# Patient Record
Sex: Female | Born: 1990 | Race: White | Hispanic: No | Smoking: Former smoker
Health system: Southern US, Community
[De-identification: ages and names within clinical notes are randomized; demographics above are authoritative.]

## PROBLEM LIST (undated history)

## (undated) ENCOUNTER — Inpatient Hospital Stay: Payer: Self-pay

## (undated) DIAGNOSIS — Z720 Tobacco use: Secondary | ICD-10-CM

## (undated) DIAGNOSIS — F411 Generalized anxiety disorder: Secondary | ICD-10-CM

## (undated) DIAGNOSIS — G43909 Migraine, unspecified, not intractable, without status migrainosus: Secondary | ICD-10-CM

## (undated) DIAGNOSIS — Z8759 Personal history of other complications of pregnancy, childbirth and the puerperium: Secondary | ICD-10-CM

## (undated) DIAGNOSIS — F329 Major depressive disorder, single episode, unspecified: Secondary | ICD-10-CM

## (undated) HISTORY — DX: Migraine, unspecified, not intractable, without status migrainosus: G43.909

## (undated) HISTORY — DX: Personal history of other complications of pregnancy, childbirth and the puerperium: Z87.59

## (undated) HISTORY — DX: Major depressive disorder, single episode, unspecified: F32.9

## (undated) HISTORY — DX: Tobacco use: Z72.0

## (undated) HISTORY — PX: IUD REMOVAL: SHX5392

---
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
## 2008-01-20 ENCOUNTER — Observation Stay: Payer: Self-pay

## 2008-01-22 ENCOUNTER — Observation Stay: Payer: Self-pay | Admitting: Obstetrics and Gynecology

## 2008-04-24 ENCOUNTER — Emergency Department: Payer: Self-pay | Admitting: Emergency Medicine

## 2008-12-04 ENCOUNTER — Emergency Department: Payer: Self-pay | Admitting: Emergency Medicine

## 2008-12-29 ENCOUNTER — Inpatient Hospital Stay: Payer: Self-pay | Admitting: Internal Medicine

## 2009-04-17 ENCOUNTER — Emergency Department: Payer: Self-pay | Admitting: Emergency Medicine

## 2009-08-07 ENCOUNTER — Emergency Department: Payer: Self-pay | Admitting: Unknown Physician Specialty

## 2009-12-16 ENCOUNTER — Emergency Department: Payer: Self-pay | Admitting: Emergency Medicine

## 2010-03-11 ENCOUNTER — Emergency Department: Payer: Self-pay | Admitting: Unknown Physician Specialty

## 2010-08-01 ENCOUNTER — Emergency Department: Payer: Self-pay | Admitting: Emergency Medicine

## 2010-11-20 ENCOUNTER — Inpatient Hospital Stay: Payer: Self-pay | Admitting: Internal Medicine

## 2011-02-12 ENCOUNTER — Emergency Department: Payer: Self-pay | Admitting: Emergency Medicine

## 2011-04-24 ENCOUNTER — Observation Stay: Payer: Self-pay | Admitting: Obstetrics and Gynecology

## 2011-06-19 ENCOUNTER — Emergency Department: Payer: Self-pay | Admitting: Emergency Medicine

## 2011-09-12 ENCOUNTER — Emergency Department: Payer: Self-pay | Admitting: Emergency Medicine

## 2012-03-11 ENCOUNTER — Emergency Department: Payer: Self-pay | Admitting: Emergency Medicine

## 2012-03-11 LAB — URINALYSIS, COMPLETE
Bacteria: NONE SEEN
Bilirubin,UR: NEGATIVE
Blood: NEGATIVE
Glucose,UR: NEGATIVE mg/dL (ref 0–75)
Ketone: NEGATIVE
Leukocyte Esterase: NEGATIVE
Nitrite: NEGATIVE
Ph: 6 (ref 4.5–8.0)
Protein: NEGATIVE
RBC,UR: 2 /HPF (ref 0–5)
Specific Gravity: 1.017 (ref 1.003–1.030)
Squamous Epithelial: 5
WBC UR: 2 /HPF (ref 0–5)

## 2012-03-11 LAB — COMPREHENSIVE METABOLIC PANEL
Albumin: 4.7 g/dL (ref 3.4–5.0)
Alkaline Phosphatase: 63 U/L (ref 50–136)
Anion Gap: 8 (ref 7–16)
BUN: 16 mg/dL (ref 7–18)
Bilirubin,Total: 0.5 mg/dL (ref 0.2–1.0)
Calcium, Total: 8.9 mg/dL (ref 8.5–10.1)
Chloride: 104 mmol/L (ref 98–107)
Co2: 26 mmol/L (ref 21–32)
Creatinine: 0.54 mg/dL — ABNORMAL LOW (ref 0.60–1.30)
EGFR (African American): >60
EGFR (Non-African Amer.): >60
Glucose: 89 mg/dL (ref 65–99)
Osmolality: 276 (ref 275–301)
Potassium: 4.2 mmol/L (ref 3.5–5.1)
SGOT(AST): 21 U/L (ref 15–37)
SGPT (ALT): 25 U/L
Sodium: 138 mmol/L (ref 136–145)
Total Protein: 8.2 g/dL (ref 6.4–8.2)

## 2012-03-11 LAB — CBC
HCT: 42.4 % (ref 35.0–47.0)
HGB: 14 g/dL (ref 12.0–16.0)
MCH: 28.3 pg (ref 26.0–34.0)
MCHC: 33 g/dL (ref 32.0–36.0)
MCV: 86 fL (ref 80–100)
Platelet: 197 10*3/uL (ref 150–440)
RBC: 4.95 10*6/uL (ref 3.80–5.20)
RDW: 15.3 % — ABNORMAL HIGH (ref 11.5–14.5)
WBC: 7.3 10*3/uL (ref 3.6–11.0)

## 2012-03-11 LAB — WET PREP, GENITAL

## 2012-03-11 LAB — HCG, QUANTITATIVE, PREGNANCY: Beta Hcg, Quant.: <1 m[IU]/mL — ABNORMAL LOW

## 2012-05-29 ENCOUNTER — Emergency Department: Payer: Self-pay | Admitting: Unknown Physician Specialty

## 2012-05-30 LAB — CBC WITH DIFFERENTIAL/PLATELET
Basophil #: 0 10*3/uL (ref 0.0–0.1)
Basophil %: 0.1 %
Eosinophil #: 0 10*3/uL (ref 0.0–0.7)
Eosinophil %: 0.1 %
HCT: 38.4 % (ref 35.0–47.0)
HGB: 12.6 g/dL (ref 12.0–16.0)
Lymphocyte #: 1 10*3/uL (ref 1.0–3.6)
Lymphocyte %: 6.1 %
MCH: 28.3 pg (ref 26.0–34.0)
MCHC: 32.8 g/dL (ref 32.0–36.0)
MCV: 86 fL (ref 80–100)
Monocyte #: 1.1 x10 3/mm — ABNORMAL HIGH (ref 0.2–0.9)
Monocyte %: 6.6 %
Neutrophil #: 14.6 10*3/uL — ABNORMAL HIGH (ref 1.4–6.5)
Neutrophil %: 87.1 %
Platelet: 126 10*3/uL — ABNORMAL LOW (ref 150–440)
RBC: 4.46 10*6/uL (ref 3.80–5.20)
RDW: 14.9 % — ABNORMAL HIGH (ref 11.5–14.5)
WBC: 16.7 10*3/uL — ABNORMAL HIGH (ref 3.6–11.0)

## 2012-05-30 LAB — URINALYSIS, COMPLETE
Bacteria: NONE SEEN
Bilirubin,UR: NEGATIVE
Blood: NEGATIVE
Glucose,UR: NEGATIVE mg/dL (ref 0–75)
Ketone: NEGATIVE
Leukocyte Esterase: NEGATIVE
Nitrite: NEGATIVE
Ph: 7 (ref 4.5–8.0)
Protein: NEGATIVE
RBC,UR: NONE SEEN /HPF (ref 0–5)
Specific Gravity: 1.024 (ref 1.003–1.030)
Squamous Epithelial: 5
WBC UR: NONE SEEN /HPF (ref 0–5)

## 2012-05-30 LAB — COMPREHENSIVE METABOLIC PANEL
Albumin: 4.1 g/dL (ref 3.4–5.0)
Alkaline Phosphatase: 91 U/L (ref 50–136)
Anion Gap: 11 (ref 7–16)
BUN: 17 mg/dL (ref 7–18)
Bilirubin,Total: 0.3 mg/dL (ref 0.2–1.0)
Calcium, Total: 8.6 mg/dL (ref 8.5–10.1)
Chloride: 109 mmol/L — ABNORMAL HIGH (ref 98–107)
Co2: 22 mmol/L (ref 21–32)
Creatinine: 0.56 mg/dL — ABNORMAL LOW (ref 0.60–1.30)
EGFR (African American): >60
EGFR (Non-African Amer.): >60
Glucose: 106 mg/dL — ABNORMAL HIGH (ref 65–99)
Osmolality: 285 (ref 275–301)
Potassium: 3.7 mmol/L (ref 3.5–5.1)
SGOT(AST): 24 U/L (ref 15–37)
SGPT (ALT): 23 U/L
Sodium: 142 mmol/L (ref 136–145)
Total Protein: 7 g/dL (ref 6.4–8.2)

## 2012-05-30 LAB — CK TOTAL AND CKMB (NOT AT ARMC)
CK, Total: 84 U/L (ref 21–215)
CK-MB: <0.5 ng/mL — ABNORMAL LOW (ref 0.5–3.6)

## 2012-05-30 LAB — WET PREP, GENITAL

## 2012-05-30 LAB — PREGNANCY, URINE: Pregnancy Test, Urine: NEGATIVE m[IU]/mL

## 2012-05-31 LAB — URINE CULTURE

## 2012-06-02 ENCOUNTER — Emergency Department: Payer: Self-pay | Admitting: Emergency Medicine

## 2012-06-02 LAB — URINALYSIS, COMPLETE
Blood: NEGATIVE
Glucose,UR: NEGATIVE mg/dL (ref 0–75)
Nitrite: NEGATIVE
Ph: 8 (ref 4.5–8.0)
RBC,UR: 1 /HPF (ref 0–5)
Specific Gravity: 1.02 (ref 1.003–1.030)
Squamous Epithelial: 4
WBC UR: 4 /HPF (ref 0–5)

## 2012-06-02 LAB — CBC
HCT: 42.1 % (ref 35.0–47.0)
HGB: 14.1 g/dL (ref 12.0–16.0)
MCV: 86 fL (ref 80–100)
Platelet: 199 10*3/uL (ref 150–440)

## 2012-06-02 LAB — COMPREHENSIVE METABOLIC PANEL
Alkaline Phosphatase: 83 U/L (ref 50–136)
Anion Gap: 8 (ref 7–16)
Chloride: 108 mmol/L — ABNORMAL HIGH (ref 98–107)
Creatinine: 0.71 mg/dL (ref 0.60–1.30)
EGFR (African American): >60
EGFR (Non-African Amer.): >60
Glucose: 77 mg/dL (ref 65–99)
SGOT(AST): 24 U/L (ref 15–37)
Sodium: 142 mmol/L (ref 136–145)
Total Protein: 7.8 g/dL (ref 6.4–8.2)

## 2012-06-02 LAB — PREGNANCY, URINE: Pregnancy Test, Urine: NEGATIVE m[IU]/mL

## 2012-06-03 LAB — MONONUCLEOSIS SCREEN: Mono Test: POSITIVE

## 2012-10-18 ENCOUNTER — Emergency Department: Payer: Self-pay | Admitting: Emergency Medicine

## 2012-10-19 LAB — URINALYSIS, COMPLETE
RBC,UR: 60 /HPF (ref 0–5)
Transitional Epi: 1
WBC UR: 408 /HPF (ref 0–5)

## 2012-10-23 LAB — URINE CULTURE

## 2012-10-28 ENCOUNTER — Emergency Department: Payer: Self-pay | Admitting: Emergency Medicine

## 2013-05-26 ENCOUNTER — Emergency Department: Payer: Self-pay | Admitting: Internal Medicine

## 2013-05-26 LAB — URINALYSIS, COMPLETE
Bilirubin,UR: NEGATIVE
Blood: NEGATIVE
Ketone: NEGATIVE
Protein: NEGATIVE
RBC,UR: 2 /HPF (ref 0–5)

## 2013-05-28 LAB — BETA STREP CULTURE(ARMC)

## 2013-06-30 DIAGNOSIS — N750 Cyst of Bartholin's gland: Secondary | ICD-10-CM | POA: Insufficient documentation

## 2014-03-24 ENCOUNTER — Emergency Department: Payer: Self-pay | Admitting: Emergency Medicine

## 2014-03-24 LAB — COMPREHENSIVE METABOLIC PANEL
ANION GAP: 9 (ref 7–16)
AST: 23 U/L (ref 15–37)
Albumin: 4.2 g/dL (ref 3.4–5.0)
Alkaline Phosphatase: 71 U/L
BILIRUBIN TOTAL: 0.3 mg/dL (ref 0.2–1.0)
BUN: 10 mg/dL (ref 7–18)
CALCIUM: 7.8 mg/dL — AB (ref 8.5–10.1)
CO2: 22 mmol/L (ref 21–32)
Chloride: 108 mmol/L — ABNORMAL HIGH (ref 98–107)
Creatinine: 0.5 mg/dL — ABNORMAL LOW (ref 0.60–1.30)
EGFR (Non-African Amer.): >60
Glucose: 110 mg/dL — ABNORMAL HIGH (ref 65–99)
OSMOLALITY: 277 (ref 275–301)
Potassium: 3.4 mmol/L — ABNORMAL LOW (ref 3.5–5.1)
SGPT (ALT): 17 U/L (ref 12–78)
SODIUM: 139 mmol/L (ref 136–145)
TOTAL PROTEIN: 7.1 g/dL (ref 6.4–8.2)

## 2014-03-24 LAB — URINALYSIS, COMPLETE
BILIRUBIN, UR: NEGATIVE
Bacteria: NONE SEEN
Blood: NEGATIVE
Glucose,UR: NEGATIVE mg/dL (ref 0–75)
Ketone: NEGATIVE
Leukocyte Esterase: NEGATIVE
Nitrite: NEGATIVE
PH: 6 (ref 4.5–8.0)
Protein: NEGATIVE
RBC,UR: 1 /HPF (ref 0–5)
Specific Gravity: 1.003 (ref 1.003–1.030)
WBC UR: 1 /HPF (ref 0–5)

## 2014-03-24 LAB — CBC
HCT: 38.3 % (ref 35.0–47.0)
HGB: 12.4 g/dL (ref 12.0–16.0)
MCH: 26.8 pg (ref 26.0–34.0)
MCHC: 32.4 g/dL (ref 32.0–36.0)
MCV: 83 fL (ref 80–100)
Platelet: 143 10*3/uL — ABNORMAL LOW (ref 150–440)
RBC: 4.62 10*6/uL (ref 3.80–5.20)
RDW: 16.5 % — ABNORMAL HIGH (ref 11.5–14.5)
WBC: 6.5 10*3/uL (ref 3.6–11.0)

## 2014-03-24 LAB — DRUG SCREEN, URINE

## 2014-03-24 LAB — SALICYLATE LEVEL: Salicylates, Serum: 2.1 mg/dL

## 2014-03-24 LAB — ETHANOL
Ethanol %: 0.277 % — ABNORMAL HIGH (ref 0.000–0.080)
Ethanol: 277 mg/dL

## 2014-03-24 LAB — PREGNANCY, URINE: PREGNANCY TEST, URINE: NEGATIVE m[IU]/mL

## 2014-03-24 LAB — ACETAMINOPHEN LEVEL: Acetaminophen: <2 ug/mL

## 2014-03-27 ENCOUNTER — Emergency Department (HOSPITAL_COMMUNITY)
Admission: EM | Admit: 2014-03-27 | Discharge: 2014-03-27 | Disposition: A | Discharge status: Home / Self Care | Payer: Self-pay | Attending: Emergency Medicine | Admitting: Emergency Medicine

## 2014-03-27 ENCOUNTER — Encounter (HOSPITAL_COMMUNITY): Payer: Self-pay | Admitting: Emergency Medicine

## 2014-03-27 ENCOUNTER — Emergency Department (HOSPITAL_COMMUNITY): Payer: Self-pay

## 2014-03-27 DIAGNOSIS — F411 Generalized anxiety disorder: Secondary | ICD-10-CM | POA: Insufficient documentation

## 2014-03-27 DIAGNOSIS — R0602 Shortness of breath: Secondary | ICD-10-CM | POA: Insufficient documentation

## 2014-03-27 DIAGNOSIS — Y9389 Activity, other specified: Secondary | ICD-10-CM | POA: Insufficient documentation

## 2014-03-27 DIAGNOSIS — S20219A Contusion of unspecified front wall of thorax, initial encounter: Secondary | ICD-10-CM | POA: Insufficient documentation

## 2014-03-27 DIAGNOSIS — W1809XA Striking against other object with subsequent fall, initial encounter: Secondary | ICD-10-CM | POA: Insufficient documentation

## 2014-03-27 DIAGNOSIS — F10929 Alcohol use, unspecified with intoxication, unspecified: Secondary | ICD-10-CM

## 2014-03-27 DIAGNOSIS — Y929 Unspecified place or not applicable: Secondary | ICD-10-CM | POA: Insufficient documentation

## 2014-03-27 DIAGNOSIS — Z3202 Encounter for pregnancy test, result negative: Secondary | ICD-10-CM | POA: Insufficient documentation

## 2014-03-27 DIAGNOSIS — F1010 Alcohol abuse, uncomplicated: Secondary | ICD-10-CM | POA: Insufficient documentation

## 2014-03-27 LAB — COMPREHENSIVE METABOLIC PANEL
ALT: 16 U/L (ref 0–35)
AST: 22 U/L (ref 0–37)
Albumin: 4.6 g/dL (ref 3.5–5.2)
Alkaline Phosphatase: 77 U/L (ref 39–117)
BILIRUBIN TOTAL: 0.3 mg/dL (ref 0.3–1.2)
BUN: 11 mg/dL (ref 6–23)
CHLORIDE: 104 meq/L (ref 96–112)
CO2: 21 mEq/L (ref 19–32)
Calcium: 9.2 mg/dL (ref 8.4–10.5)
Creatinine, Ser: 0.61 mg/dL (ref 0.50–1.10)
GFR calc Af Amer: >90 mL/min (ref >90)
GFR calc non Af Amer: >90 mL/min (ref >90)
GLUCOSE: 118 mg/dL — AB (ref 70–99)
POTASSIUM: 3.5 meq/L — AB (ref 3.7–5.3)
SODIUM: 144 meq/L (ref 137–147)
Total Protein: 7.6 g/dL (ref 6.0–8.3)

## 2014-03-27 LAB — RAPID URINE DRUG SCREEN, HOSP PERFORMED
Amphetamines: NOT DETECTED
BARBITURATES: NOT DETECTED
Benzodiazepines: NOT DETECTED
COCAINE: NOT DETECTED
Opiates: NOT DETECTED
Tetrahydrocannabinol: NOT DETECTED

## 2014-03-27 LAB — CBC WITH DIFFERENTIAL/PLATELET
Basophils Absolute: 0 10*3/uL (ref 0.0–0.1)
Basophils Relative: 0 % (ref 0–1)
Eosinophils Absolute: 0 10*3/uL (ref 0.0–0.7)
Eosinophils Relative: 0 % (ref 0–5)
HCT: 40.5 % (ref 36.0–46.0)
HEMOGLOBIN: 14.2 g/dL (ref 12.0–15.0)
LYMPHS ABS: 2.4 10*3/uL (ref 0.7–4.0)
Lymphocytes Relative: 30 % (ref 12–46)
MCH: 28.1 pg (ref 26.0–34.0)
MCHC: 35.1 g/dL (ref 30.0–36.0)
MCV: 80 fL (ref 78.0–100.0)
MONOS PCT: 5 % (ref 3–12)
Monocytes Absolute: 0.4 10*3/uL (ref 0.1–1.0)
NEUTROS ABS: 5.1 10*3/uL (ref 1.7–7.7)
NEUTROS PCT: 64 % (ref 43–77)
Platelets: 204 10*3/uL (ref 150–400)
RBC: 5.06 MIL/uL (ref 3.87–5.11)
RDW: 15.7 % — ABNORMAL HIGH (ref 11.5–15.5)
WBC: 8 10*3/uL (ref 4.0–10.5)

## 2014-03-27 LAB — URINALYSIS, ROUTINE W REFLEX MICROSCOPIC
Bilirubin Urine: NEGATIVE
Glucose, UA: NEGATIVE mg/dL
Hgb urine dipstick: NEGATIVE
KETONES UR: NEGATIVE mg/dL
LEUKOCYTES UA: NEGATIVE
Nitrite: NEGATIVE
PROTEIN: NEGATIVE mg/dL
Specific Gravity, Urine: <1.005 — ABNORMAL LOW (ref 1.005–1.030)
Urobilinogen, UA: 0.2 mg/dL (ref 0.0–1.0)
pH: 6 (ref 5.0–8.0)

## 2014-03-27 LAB — POC URINE PREG, ED: Preg Test, Ur: NEGATIVE

## 2014-03-27 LAB — ETHANOL: Alcohol, Ethyl (B): 274 mg/dL — ABNORMAL HIGH (ref 0–11)

## 2014-03-27 MED ORDER — IOHEXOL 300 MG/ML  SOLN
80.0000 mL | Freq: Once | INTRAMUSCULAR | Status: AC | PRN
  Administered 2014-03-27: 80 mL via INTRAVENOUS

## 2014-03-27 MED ORDER — TRAMADOL HCL 50 MG PO TABS: 50.0000 mg | ORAL_TABLET | Freq: Four times a day (QID) | ORAL | Status: DC | PRN

## 2014-03-27 MED ORDER — FENTANYL CITRATE 0.05 MG/ML IJ SOLN: 50.0000 ug | Freq: Once | INTRAMUSCULAR | Status: DC

## 2014-03-27 MED ORDER — IBUPROFEN 600 MG PO TABS: 600.0000 mg | ORAL_TABLET | Freq: Four times a day (QID) | ORAL | Status: DC | PRN

## 2014-03-27 MED ORDER — FENTANYL CITRATE 0.05 MG/ML IJ SOLN
50.0000 ug | Freq: Once | INTRAMUSCULAR | Status: AC
  Administered 2014-03-27: 50 ug via INTRAVENOUS
  Filled 2014-03-27: qty 2

## 2014-03-27 MED ORDER — LORAZEPAM 2 MG/ML IJ SOLN
1.0000 mg | Freq: Once | INTRAMUSCULAR | Status: AC
  Administered 2014-03-27: 1 mg via INTRAVENOUS
  Filled 2014-03-27: qty 1

## 2014-03-27 NOTE — ED Notes (Signed)
The pt has been drinking alcohol and approx 2 hours ago she stumbled and fell onto a wooden crate.  Pain in her anterior lateral ribs on the  Rt.  She struck the ribs posteriorly but there was a bulge  Rt anteriorly.

## 2014-03-27 NOTE — ED Provider Notes (Addendum)
CSN: [id number]     Arrival date & time 03/27/14  0326 History   First MD Initiated Contact with Patient 03/27/14 0340     Chief Complaint  Patient presents with  . Rib Injury     (Consider location/radiation/quality/duration/timing/severity/associated sxs/prior Treatment) HPI This patient is a 23 year old woman in generally good health. She presents after a fall with complaints of left-sided chest pain. She is intoxicated after having many alcoholic drinks tonight. She said that she fell because she was intoxicated and lost her balance. She landed on the ground, left side down. She denies head, and headache. Her pain as 10 over 10. It hurts take a deep breath. Pain is nonradiating. Injury happened about an hour ago. Pain is sharp in nature. The patient endorses some shortness of breath. She denies pain or injury to any other region.  History reviewed. No pertinent past medical history. History reviewed. No pertinent past surgical history. No family history on file. History  Substance Use Topics  . Smoking status: Never Smoker   . Smokeless tobacco: Not on file  . Alcohol Use: Yes   OB History   Grav Para Term Preterm Abortions TAB SAB Ect Mult Living                 Review of Systems Ten point review of symptoms performed and is negative with the exception of symptoms noted above.     Allergies  Review of patient's allergies indicates no known allergies.  Home Medications  No current outpatient prescriptions on file. BP 115/96  Pulse 122  Temp(Src) 98.4 F (36.9 C) (Oral)  Resp 23  Ht 5\' 5"  (1.651 m)  Wt 135 lb (61.236 kg)  BMI 22.47 kg/m2  SpO2 98% Physical Exam Gen: well developed and well nourished appearing, extremely anxious appearing, smells of alcohol Head: NCAT Eyes: PERL, EOMI Nose: no epistaixis or rhinorrhea Mouth/throat: mucosa is moist and pink Neck: supple, no stridor, no midline ttp Chest wall: ttp over the left lateral chest Lungs: CTA B, no  wheezing, rhonchi or rales CV: rapid and regular - pulse 140, no murmur, extremities appear well perfused.  Abd: soft, notender, nondistended Back: no ttp, no cva ttp Skin: warm and dry Ext: normal to inspection, no dependent edema Neuro: CN ii-xii grossly intact, no focal deficits Psyche; anxious and tearful affect   ED Course  Procedures (including critical care time) Labs Review  Results for orders placed during the hospital encounter of 03/27/14 (from the past 24 hour(s))  URINALYSIS, ROUTINE W REFLEX MICROSCOPIC     Status: Abnormal   Collection Time    03/27/14  3:52 AM      Result Value Ref Range   Color, Urine STRAW (*) YELLOW   APPearance CLEAR  CLEAR   Specific Gravity, Urine <1.005 (*) 1.005 - 1.030   pH 6.0  5.0 - 8.0   Glucose, UA NEGATIVE  NEGATIVE mg/dL   Hgb urine dipstick NEGATIVE  NEGATIVE   Bilirubin Urine NEGATIVE  NEGATIVE   Ketones, ur NEGATIVE  NEGATIVE mg/dL   Protein, ur NEGATIVE  NEGATIVE mg/dL   Urobilinogen, UA 0.2  0.0 - 1.0 mg/dL   Nitrite NEGATIVE  NEGATIVE   Leukocytes, UA NEGATIVE  NEGATIVE  CBC WITH DIFFERENTIAL     Status: Abnormal   Collection Time    03/27/14  3:53 AM      Result Value Ref Range   WBC 8.0  4.0 - 10.5 K/uL   RBC 5.06  3.87 -  5.11 MIL/uL   Hemoglobin 14.2  12.0 - 15.0 g/dL   HCT 16.140.5  09.636.0 - 04.546.0 %   MCV 80.0  78.0 - 100.0 fL   MCH 28.1  26.0 - 34.0 pg   MCHC 35.1  30.0 - 36.0 g/dL   RDW 40.915.7 (*) 81.111.5 - 91.415.5 %   Platelets 204  150 - 400 K/uL   Neutrophils Relative % 64  43 - 77 %   Neutro Abs 5.1  1.7 - 7.7 K/uL   Lymphocytes Relative 30  12 - 46 %   Lymphs Abs 2.4  0.7 - 4.0 K/uL   Monocytes Relative 5  3 - 12 %   Monocytes Absolute 0.4  0.1 - 1.0 K/uL   Eosinophils Relative 0  0 - 5 %   Eosinophils Absolute 0.0  0.0 - 0.7 K/uL   Basophils Relative 0  0 - 1 %   Basophils Absolute 0.0  0.0 - 0.1 K/uL  COMPREHENSIVE METABOLIC PANEL     Status: Abnormal   Collection Time    03/27/14  3:53 AM      Result Value  Ref Range   Sodium 144  137 - 147 mEq/L   Potassium 3.5 (*) 3.7 - 5.3 mEq/L   Chloride 104  96 - 112 mEq/L   CO2 21  19 - 32 mEq/L   Glucose, Bld 118 (*) 70 - 99 mg/dL   BUN 11  6 - 23 mg/dL   Creatinine, Ser 7.820.61  0.50 - 1.10 mg/dL   Calcium 9.2  8.4 - 95.610.5 mg/dL   Total Protein 7.6  6.0 - 8.3 g/dL   Albumin 4.6  3.5 - 5.2 g/dL   AST 22  0 - 37 U/L   ALT 16  0 - 35 U/L   Alkaline Phosphatase 77  39 - 117 U/L   Total Bilirubin 0.3  0.3 - 1.2 mg/dL   GFR calc non Af Amer >90  >90 mL/min   GFR calc Af Amer >90  >90 mL/min  ETHANOL     Status: Abnormal   Collection Time    03/27/14  3:53 AM      Result Value Ref Range   Alcohol, Ethyl (B) 274 (*) 0 - 11 mg/dL   CXR: normal cardiac silloute, normal appearing mediastinum, no infiltrates, no acute process identified.   EKG: sinus tach at 115 bpm, no acute ischemic changes, normal intervals, normal axis, normal qrs complex  CT chest with contrast: no rib fx or pulmonary contusion or ptx. No acute findings.   MDM   Patient now resting comfortably with pulse in 80s after tx with Fentanyl for pain and ativan for anxiety. VS are wnl. CT chest pending to rule out pulmonary contusion, occult rib fx.   [id number]: Patient is still sleeping.  Informed mother of CT results and plan of care for pain management.. Stable for discharge with plan for pain management and close outpatient follow up.    Brandt LoosenJulie Manly, MD 03/27/14 0559  Brandt LoosenJulie Manly, MD 03/27/14 [phone]

## 2014-03-27 NOTE — ED Notes (Signed)
Patient transported to CT 

## 2014-09-11 ENCOUNTER — Emergency Department: Payer: Self-pay | Admitting: Emergency Medicine

## 2014-09-11 LAB — COMPREHENSIVE METABOLIC PANEL
ALBUMIN: 4.4 g/dL (ref 3.4–5.0)
ANION GAP: 8 (ref 7–16)
Alkaline Phosphatase: 77 U/L
BILIRUBIN TOTAL: 0.2 mg/dL (ref 0.2–1.0)
BUN: 7 mg/dL (ref 7–18)
CALCIUM: 8.4 mg/dL — AB (ref 8.5–10.1)
CHLORIDE: 113 mmol/L — AB (ref 98–107)
Co2: 24 mmol/L (ref 21–32)
Creatinine: 0.71 mg/dL (ref 0.60–1.30)
EGFR (African American): >60
EGFR (Non-African Amer.): >60
Glucose: 109 mg/dL — ABNORMAL HIGH (ref 65–99)
OSMOLALITY: 287 (ref 275–301)
Potassium: 3.6 mmol/L (ref 3.5–5.1)
SGOT(AST): 27 U/L (ref 15–37)
SGPT (ALT): 27 U/L
SODIUM: 145 mmol/L (ref 136–145)
Total Protein: 7.7 g/dL (ref 6.4–8.2)

## 2014-09-11 LAB — URINALYSIS, COMPLETE
Bacteria: NONE SEEN
Bilirubin,UR: NEGATIVE
Glucose,UR: NEGATIVE mg/dL (ref 0–75)
KETONE: NEGATIVE
NITRITE: NEGATIVE
Ph: 6 (ref 4.5–8.0)
Protein: NEGATIVE
RBC,UR: 2 /HPF (ref 0–5)
Specific Gravity: 1.003 (ref 1.003–1.030)
Squamous Epithelial: 1

## 2014-09-11 LAB — CBC
HCT: 43.4 % (ref 35.0–47.0)
HGB: 14.2 g/dL (ref 12.0–16.0)
MCH: 28.3 pg (ref 26.0–34.0)
MCHC: 32.8 g/dL (ref 32.0–36.0)
MCV: 86 fL (ref 80–100)
Platelet: 164 10*3/uL (ref 150–440)
RBC: 5.03 10*6/uL (ref 3.80–5.20)
RDW: 16.3 % — AB (ref 11.5–14.5)
WBC: 8.6 10*3/uL (ref 3.6–11.0)

## 2014-09-11 LAB — SALICYLATE LEVEL: Salicylates, Serum: 2.5 mg/dL

## 2014-09-11 LAB — DRUG SCREEN, URINE

## 2014-09-11 LAB — ACETAMINOPHEN LEVEL

## 2014-09-11 LAB — ETHANOL: ETHANOL LVL: 197 mg/dL

## 2014-11-22 ENCOUNTER — Emergency Department: Payer: Self-pay | Admitting: Emergency Medicine

## 2014-11-22 LAB — URINALYSIS, COMPLETE
BILIRUBIN, UR: NEGATIVE
Bacteria: NONE SEEN
Blood: NEGATIVE
Glucose,UR: NEGATIVE mg/dL (ref 0–75)
Ketone: NEGATIVE
Nitrite: NEGATIVE
PH: 7 (ref 4.5–8.0)
PROTEIN: NEGATIVE
RBC,UR: 5 /HPF (ref 0–5)
SPECIFIC GRAVITY: 1.016 (ref 1.003–1.030)
WBC UR: 13 /HPF (ref 0–5)

## 2014-11-22 LAB — MONONUCLEOSIS SCREEN: Mono Test: NEGATIVE

## 2015-04-06 ENCOUNTER — Ambulatory Visit
Admit: 2015-04-06 | Disposition: A | Discharge status: Other Acute Inpatient Hospital | Payer: Self-pay | Attending: Family Medicine | Admitting: Family Medicine

## 2015-04-06 ENCOUNTER — Emergency Department
Admit: 2015-04-06 | Disposition: A | Discharge status: Home / Self Care | Payer: Self-pay | Admitting: Emergency Medicine

## 2015-04-06 LAB — URINALYSIS, COMPLETE
Bilirubin,UR: NEGATIVE
Glucose,UR: NEGATIVE mg/dL (ref 0–75)
Ketone: NEGATIVE
Nitrite: NEGATIVE
PH: 7 (ref 4.5–8.0)
PROTEIN: NEGATIVE
Specific Gravity: 1.009 (ref 1.003–1.030)

## 2015-04-06 LAB — COMPREHENSIVE METABOLIC PANEL
ALBUMIN: 4.6 g/dL
ALK PHOS: 63 U/L
ALT: 31 U/L
AST: 30 U/L
Anion Gap: 6 — ABNORMAL LOW (ref 7–16)
BUN: 11 mg/dL
Bilirubin,Total: 0.3 mg/dL
CO2: 27 mmol/L
Calcium, Total: 9.1 mg/dL
Chloride: 108 mmol/L
Creatinine: 0.61 mg/dL
Glucose: 101 mg/dL — ABNORMAL HIGH
POTASSIUM: 4 mmol/L
SODIUM: 141 mmol/L
Total Protein: 7.1 g/dL

## 2015-04-06 LAB — CBC
HCT: 40.6 % (ref 35.0–47.0)
HGB: 13.8 g/dL (ref 12.0–16.0)
MCH: 29.4 pg (ref 26.0–34.0)
MCHC: 33.9 g/dL (ref 32.0–36.0)
MCV: 87 fL (ref 80–100)
PLATELETS: 160 10*3/uL (ref 150–440)
RBC: 4.69 10*6/uL (ref 3.80–5.20)
RDW: 14.1 % (ref 11.5–14.5)
WBC: 9.8 10*3/uL (ref 3.6–11.0)

## 2015-04-06 LAB — HCG, QUANTITATIVE, PREGNANCY: Beta Hcg, Quant.: 36 m[IU]/mL — ABNORMAL HIGH

## 2015-06-11 DIAGNOSIS — O09299 Supervision of pregnancy with other poor reproductive or obstetric history, unspecified trimester: Secondary | ICD-10-CM | POA: Insufficient documentation

## 2015-06-11 DIAGNOSIS — Z8759 Personal history of other complications of pregnancy, childbirth and the puerperium: Secondary | ICD-10-CM

## 2015-06-11 DIAGNOSIS — Z720 Tobacco use: Secondary | ICD-10-CM | POA: Insufficient documentation

## 2015-06-12 ENCOUNTER — Ambulatory Visit (INDEPENDENT_AMBULATORY_CARE_PROVIDER_SITE_OTHER): Payer: Managed Care, Other (non HMO) | Admitting: Obstetrics and Gynecology

## 2015-06-12 ENCOUNTER — Encounter: Payer: Self-pay | Admitting: Obstetrics and Gynecology

## 2015-06-12 VITALS — BP 99/65 | HR 79 | Ht 64.0 in | Wt 143.1 lb

## 2015-06-12 DIAGNOSIS — O09291 Supervision of pregnancy with other poor reproductive or obstetric history, first trimester: Secondary | ICD-10-CM

## 2015-06-12 DIAGNOSIS — Z720 Tobacco use: Secondary | ICD-10-CM

## 2015-06-12 DIAGNOSIS — N926 Irregular menstruation, unspecified: Secondary | ICD-10-CM | POA: Diagnosis not present

## 2015-06-12 NOTE — Patient Instructions (Signed)
First Trimester of Pregnancy The first trimester of pregnancy is from week 1 until the end of week 12 (months 1 through 3). A week after a sperm fertilizes an egg, the egg will implant on the wall of the uterus. This embryo will begin to develop into a baby. Genes from you and your partner are forming the baby. The female genes determine whether the baby is a boy or a girl. At 6-8 weeks, the eyes and face are formed, and the heartbeat can be seen on ultrasound. At the end of 12 weeks, all the baby's organs are formed.  Now that you are pregnant, you will want to do everything you can to have a healthy baby. Two of the most important things are to get good prenatal care and to follow your health care provider's instructions. Prenatal care is all the medical care you receive before the baby's birth. This care will help prevent, find, and treat any problems during the pregnancy and childbirth. BODY CHANGES Your body goes through many changes during pregnancy. The changes vary from woman to woman.   You may gain or lose a couple of pounds at first.  You may feel sick to your stomach (nauseous) and throw up (vomit). If the vomiting is uncontrollable, call your health care provider.  You may tire easily.  You may develop headaches that can be relieved by medicines approved by your health care provider.  You may urinate more often. Painful urination may mean you have a bladder infection.  You may develop heartburn as a result of your pregnancy.  You may develop constipation because certain hormones are causing the muscles that push waste through your intestines to slow down.  You may develop hemorrhoids or swollen, bulging veins (varicose veins).  Your breasts may begin to grow larger and become tender. Your nipples may stick out more, and the tissue that surrounds them (areola) may become darker.  Your gums may bleed and may be sensitive to brushing and flossing.  Dark spots or blotches (chloasma,  mask of pregnancy) may develop on your face. This will likely fade after the baby is born.  Your menstrual periods will stop.  You may have a loss of appetite.  You may develop cravings for certain kinds of food.  You may have changes in your emotions from day to day, such as being excited to be pregnant or being concerned that something may go wrong with the pregnancy and baby.  You may have more vivid and strange dreams.  You may have changes in your hair. These can include thickening of your hair, rapid growth, and changes in texture. Some women also have hair loss during or after pregnancy, or hair that feels dry or thin. Your hair will most likely return to normal after your baby is born. WHAT TO EXPECT AT YOUR PRENATAL VISITS During a routine prenatal visit:  You will be weighed to make sure you and the baby are growing normally.  Your blood pressure will be taken.  Your abdomen will be measured to track your baby's growth.  The fetal heartbeat will be listened to starting around week 10 or 12 of your pregnancy.  Test results from any previous visits will be discussed. Your health care provider may ask you:  How you are feeling.  If you are feeling the baby move.  If you have had any abnormal symptoms, such as leaking fluid, bleeding, severe headaches, or abdominal cramping.  If you have any questions. Other tests   that may be performed during your first trimester include:  Blood tests to find your blood type and to check for the presence of any previous infections. They will also be used to check for low iron levels (anemia) and Rh antibodies. Later in the pregnancy, blood tests for diabetes will be done along with other tests if problems develop.  Urine tests to check for infections, diabetes, or protein in the urine.  An ultrasound to confirm the proper growth and development of the baby.  An amniocentesis to check for possible genetic problems.  Fetal screens for  spina bifida and Down syndrome.  You may need other tests to make sure you and the baby are doing well. HOME CARE INSTRUCTIONS  Medicines  Follow your health care provider's instructions regarding medicine use. Specific medicines may be either safe or unsafe to take during pregnancy.  Take your prenatal vitamins as directed.  If you develop constipation, try taking a stool softener if your health care provider approves. Diet  Eat regular, well-balanced meals. Choose a variety of foods, such as meat or vegetable-based protein, fish, milk and low-fat dairy products, vegetables, fruits, and whole grain breads and cereals. Your health care provider will help you determine the amount of weight gain that is right for you.  Avoid raw meat and uncooked cheese. These carry germs that can cause birth defects in the baby.  Eating four or five small meals rather than three large meals a day may help relieve nausea and vomiting. If you start to feel nauseous, eating a few soda crackers can be helpful. Drinking liquids between meals instead of during meals also seems to help nausea and vomiting.  If you develop constipation, eat more high-fiber foods, such as fresh vegetables or fruit and whole grains. Drink enough fluids to keep your urine clear or pale yellow. Activity and Exercise  Exercise only as directed by your health care provider. Exercising will help you:  Control your weight.  Stay in shape.  Be prepared for labor and delivery.  Experiencing pain or cramping in the lower abdomen or low back is a good sign that you should stop exercising. Check with your health care provider before continuing normal exercises.  Try to avoid standing for long periods of time. Move your legs often if you must stand in one place for a long time.  Avoid heavy lifting.  Wear low-heeled shoes, and practice good posture.  You may continue to have sex unless your health care provider directs you  otherwise. Relief of Pain or Discomfort  Wear a good support bra for breast tenderness.   Take warm sitz baths to soothe any pain or discomfort caused by hemorrhoids. Use hemorrhoid cream if your health care provider approves.   Rest with your legs elevated if you have leg cramps or low back pain.  If you develop varicose veins in your legs, wear support hose. Elevate your feet for 15 minutes, 3-4 times a day. Limit salt in your diet. Prenatal Care  Schedule your prenatal visits by the twelfth week of pregnancy. They are usually scheduled monthly at first, then more often in the last 2 months before delivery.  Write down your questions. Take them to your prenatal visits.  Keep all your prenatal visits as directed by your health care provider. Safety  Wear your seat belt at all times when driving.  Make a list of emergency phone numbers, including numbers for family, friends, the hospital, and police and fire departments. General Tips    Ask your health care provider for a referral to a local prenatal education class. Begin classes no later than at the beginning of month 6 of your pregnancy.  Ask for help if you have counseling or nutritional needs during pregnancy. Your health care provider can offer advice or refer you to specialists for help with various needs.  Do not use hot tubs, steam rooms, or saunas.  Do not douche or use tampons or scented sanitary pads.  Do not cross your legs for long periods of time.  Avoid cat litter boxes and soil used by cats. These carry germs that can cause birth defects in the baby and possibly loss of the fetus by miscarriage or stillbirth.  Avoid all smoking, herbs, alcohol, and medicines not prescribed by your health care provider. Chemicals in these affect the formation and growth of the baby.  Schedule a dentist appointment. At home, brush your teeth with a soft toothbrush and be gentle when you floss. SEEK MEDICAL CARE IF:   You have  dizziness.  You have mild pelvic cramps, pelvic pressure, or nagging pain in the abdominal area.  You have persistent nausea, vomiting, or diarrhea.  You have a bad smelling vaginal discharge.  You have pain with urination.  You notice increased swelling in your face, hands, legs, or ankles. SEEK IMMEDIATE MEDICAL CARE IF:   You have a fever.  You are leaking fluid from your vagina.  You have spotting or bleeding from your vagina.  You have severe abdominal cramping or pain.  You have rapid weight gain or loss.  You vomit blood or material that looks like coffee grounds.  You are exposed to German measles and have never had them.  You are exposed to fifth disease or chickenpox.  You develop a severe headache.  You have shortness of breath.  You have any kind of trauma, such as from a fall or a car accident. Document Released: 11/25/2001 Document Revised: 04/17/2014 Document Reviewed: 10/11/2013 Exit[employer] Patient Information 2015 [employer]. This information is not intended to replace advice given to you by your health care provider. Make sure you discuss any questions you have with your health care provider.  

## 2015-06-12 NOTE — Progress Notes (Signed)
GYNECOLOGY PROGRESS NOTE  Subjective:    Vicki Cruz is a 24 y.o. 123P1011 female who presents for evaluation of amenorrhea. She believes she could be pregnant. Pregnancy is desired. Sexual Activity: single partner, contraception: none. Current symptoms also include: breast tenderness and positive home pregnancy test. Last period was abnormal.  Patient had h/o miscarriage in 04/06/2015, with abnormal menses following ~ 1-21 weekx later.  No further cycles after that.   No LMP recorded (lmp unknown). Patient is pregnant. The following portions of the patient's history were reviewed and updated as appropriate: allergies, current medications, past family history, past medical history, past social history, past surgical history and problem list.  Review of Systems Pertinent items are noted in HPI.     Objective:    BP 99/65 mmHg  Pulse 79  Ht 5\' 4"  (1.626 m)  Wt 143 lb 1.6 oz (64.91 kg)  BMI 24.55 kg/m2  LMP  (LMP Unknown) General: alert, no distress and no acute distress    Lab Review Urine HCG: positive    Assessment:    Absence of menstruation.   History of recent miscarriage   Plan:    Pregnancy Test: Positive: EDC: unsure as patient with unknown LMP. Briefly discussed pre-natal care options. Encouraged well-balanced diet, plenty of rest when needed, pre-natal vitamins daily and walking for exercise. Discussed self-help for nausea, avoiding OTC medications until consulting provider or pharmacist, other than Tylenol as needed, minimal caffeine (1-2 cups daily) and avoiding alcohol. Has discontinued smoking since discovery of pregnancy.  To schedule dating sono in next few days, and NOB intake in 2-3 weeks.

## 2015-06-13 ENCOUNTER — Telehealth: Payer: Self-pay | Admitting: Obstetrics and Gynecology

## 2015-06-13 ENCOUNTER — Ambulatory Visit: Payer: Self-pay | Admitting: Obstetrics and Gynecology

## 2015-06-13 DIAGNOSIS — R399 Unspecified symptoms and signs involving the genitourinary system: Secondary | ICD-10-CM

## 2015-06-13 NOTE — Telephone Encounter (Signed)
PT CALLED AND SINCE THIS MORNING SHE IS CRAMPING, NO BLOOD, AND SHE STATES IT IS BURNING VERY BAD WHEN SHE URINATES. PT WOULD LIKE A CALL BACK TO KNOW WHAT HSE NEEDS TO DO.

## 2015-06-15 ENCOUNTER — Ambulatory Visit: Payer: Managed Care, Other (non HMO)

## 2015-06-15 ENCOUNTER — Other Ambulatory Visit: Payer: Managed Care, Other (non HMO)

## 2015-06-15 ENCOUNTER — Other Ambulatory Visit: Payer: Self-pay

## 2015-06-15 DIAGNOSIS — N926 Irregular menstruation, unspecified: Secondary | ICD-10-CM

## 2015-06-15 DIAGNOSIS — R399 Unspecified symptoms and signs involving the genitourinary system: Secondary | ICD-10-CM

## 2015-06-15 NOTE — Telephone Encounter (Signed)
Pt calls and states she believes she may have a UTI. Advised pt to leave urine sample when she comes today @ 1:30pm for u/s. Pt gave verbal understanding.

## 2015-06-15 NOTE — Telephone Encounter (Signed)
Called pt no answer, advised pt to call back to the office to be evaluated further.

## 2015-06-16 LAB — URINALYSIS, ROUTINE W REFLEX MICROSCOPIC
Bilirubin, UA: NEGATIVE
Glucose, UA: NEGATIVE
KETONES UA: NEGATIVE
LEUKOCYTES UA: NEGATIVE
NITRITE UA: NEGATIVE
PH UA: 6 (ref 5.0–7.5)
PROTEIN UA: NEGATIVE
RBC, UA: NEGATIVE
SPEC GRAV UA: 1.023 (ref 1.005–1.030)
Urobilinogen, Ur: 0.2 mg/dL (ref 0.2–1.0)

## 2015-06-25 ENCOUNTER — Other Ambulatory Visit: Payer: Self-pay

## 2015-06-25 DIAGNOSIS — Z3687 Encounter for antenatal screening for uncertain dates: Secondary | ICD-10-CM

## 2015-06-25 DIAGNOSIS — R399 Unspecified symptoms and signs involving the genitourinary system: Secondary | ICD-10-CM

## 2015-06-25 MED ORDER — NITROFURANTOIN MONOHYD MACRO 100 MG PO CAPS: 100.0000 mg | ORAL_CAPSULE | Freq: Two times a day (BID) | ORAL | Status: DC

## 2015-06-26 ENCOUNTER — Telehealth: Payer: Self-pay

## 2015-06-26 DIAGNOSIS — O219 Vomiting of pregnancy, unspecified: Secondary | ICD-10-CM

## 2015-06-26 MED ORDER — DOXYLAMINE-PYRIDOXINE 10-10 MG PO TBEC: 10.0000 mg | DELAYED_RELEASE_TABLET | Freq: Three times a day (TID) | ORAL | Status: DC

## 2015-06-26 NOTE — Telephone Encounter (Signed)
Spoke with pt she states that she has noticed some spots of bright red blood in her vomit especially in the morning. Pt states that this has happened 2 times. Advised pt that this is likely caused by irritation to the esophagus. Advised pt that she should begin taking Diclegis to control the vominting and therefore control the bleeding. Advised pt that rx would be sent in for medication. Advised pt to call back if there is no improvement.

## 2015-06-29 ENCOUNTER — Telehealth: Payer: Self-pay | Admitting: Obstetrics and Gynecology

## 2015-06-29 NOTE — Telephone Encounter (Signed)
Called pt advised her that she can take Ginger  QID for n/v. Advised pt of eating small meals, drinking ginger ale, and staying hydrated. Advised pt that if there is no improvement to call back.

## 2015-06-29 NOTE — Telephone Encounter (Signed)
PT CALLED AND THE RX THAT WAS PRESCRIBED FOR HER MORNING SICKNESS WAS 400.00. 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 PT WOULD LIKE A CALL BACK

## 2015-07-03 ENCOUNTER — Ambulatory Visit: Payer: Managed Care, Other (non HMO)

## 2015-07-03 ENCOUNTER — Other Ambulatory Visit: Payer: Managed Care, Other (non HMO)

## 2015-07-03 DIAGNOSIS — Z36 Encounter for antenatal screening of mother: Secondary | ICD-10-CM | POA: Diagnosis not present

## 2015-07-03 DIAGNOSIS — Z3687 Encounter for antenatal screening for uncertain dates: Secondary | ICD-10-CM

## 2015-07-05 ENCOUNTER — Telehealth: Payer: Self-pay | Admitting: Obstetrics and Gynecology

## 2015-07-05 DIAGNOSIS — O219 Vomiting of pregnancy, unspecified: Secondary | ICD-10-CM

## 2015-07-05 MED ORDER — PROMETHAZINE HCL 25 MG PO TABS: 25.0000 mg | ORAL_TABLET | Freq: Four times a day (QID) | ORAL | Status: DC | PRN

## 2015-07-05 NOTE — Telephone Encounter (Signed)
GINGER PILLS AREN'T WORKING ANYMORE, PT CAN'T KEEP ANYTHING DOWN, SHE THINKS SHE MAY ME HYDRATED. PLEASE CALL.

## 2015-07-05 NOTE — Telephone Encounter (Signed)
Called pt she states that the ginger is not helping with her nausea and vomiting. Pt is unable to use Diclegis as her insurance will not cover it. Called in rx for phenergan per the protocol sheet.

## 2015-07-09 ENCOUNTER — Ambulatory Visit (INDEPENDENT_AMBULATORY_CARE_PROVIDER_SITE_OTHER): Payer: Managed Care, Other (non HMO) | Admitting: Obstetrics and Gynecology

## 2015-07-09 VITALS — BP 106/73 | HR 73 | Wt 142.0 lb

## 2015-07-09 DIAGNOSIS — Z1389 Encounter for screening for other disorder: Secondary | ICD-10-CM

## 2015-07-09 DIAGNOSIS — Z3682 Encounter for antenatal screening for nuchal translucency: Secondary | ICD-10-CM

## 2015-07-09 DIAGNOSIS — Z3490 Encounter for supervision of normal pregnancy, unspecified, unspecified trimester: Secondary | ICD-10-CM

## 2015-07-09 DIAGNOSIS — Z113 Encounter for screening for infections with a predominantly sexual mode of transmission: Secondary | ICD-10-CM

## 2015-07-09 DIAGNOSIS — Z36 Encounter for antenatal screening of mother: Secondary | ICD-10-CM

## 2015-07-09 DIAGNOSIS — Z369 Encounter for antenatal screening, unspecified: Secondary | ICD-10-CM

## 2015-07-09 DIAGNOSIS — Z331 Pregnant state, incidental: Secondary | ICD-10-CM

## 2015-07-09 NOTE — Progress Notes (Cosign Needed)
    Vicki Cruz for NOB nurse interview visit. G-3. P-1011. Pregnancy eduction material explained and given. NOB labs ordered. HIV consent form signed. PNV encouraged. NT ordered.  Pt. To follow up with provider in weeks for NOB physical.  No cats in the home. Drug screen consent form signed and sent to lab. To pick up Phenergan today. Has lost 5 lbs. Due to n/v. Encouraged fluids such as gatorade, ginger ale, coke. Pt asked about caffeine use. Would like to have a cup of coffee every day and small soda but to refrain from a lot of caffeine. .All questions answered.   ZIKA EXPOSURE SCREEN:   The patient has not traveled to a [place] Virus endemic area within the past 6 months, nor has she had unprotected sex with a partner who has travelled to a [place] endemic region within the past 6 months. The patient has been advised to notify us if these factors change any time during this current pregnancy, so adequate testing and monitoring can be initiated.

## 2015-07-10 ENCOUNTER — Other Ambulatory Visit: Payer: Self-pay | Admitting: Obstetrics and Gynecology

## 2015-07-10 NOTE — Addendum Note (Signed)
Addended by: Frankey Shown on: 07/10/2015 03:39 PM   Modules accepted: Orders

## 2015-07-11 LAB — URINALYSIS, ROUTINE W REFLEX MICROSCOPIC
BILIRUBIN UA: NEGATIVE
Glucose, UA: NEGATIVE
Ketones, UA: NEGATIVE
Nitrite, UA: NEGATIVE
RBC UA: NEGATIVE
Specific Gravity, UA: 1.016 (ref 1.005–1.030)
UUROB: 0.2 mg/dL (ref 0.2–1.0)
pH, UA: 8.5 — ABNORMAL HIGH (ref 5.0–7.5)

## 2015-07-11 LAB — MICROSCOPIC EXAMINATION: Casts: NONE SEEN /lpf

## 2015-07-12 ENCOUNTER — Telehealth: Payer: Self-pay

## 2015-07-12 LAB — URINE CULTURE

## 2015-07-12 LAB — GC/CHLAMYDIA PROBE AMP
Chlamydia trachomatis, NAA: NEGATIVE
NEISSERIA GONORRHOEAE BY PCR: NEGATIVE

## 2015-07-12 NOTE — Telephone Encounter (Signed)
Unable to reach pt at home number-no answer and mobile number-left message that rx sent in to [facility] and to contact office tomorrow for further information if needed.

## 2015-07-12 NOTE — Telephone Encounter (Signed)
-----   Message from Anika Cherry, MD sent at 07/12/2015  3:48 PM EDT ----- Please contact patient and inform of Klebsiella UTI.  Needs treatment with Augmentin 500/125 mg BID x 7 days. 

## 2015-07-13 ENCOUNTER — Telehealth: Payer: Self-pay

## 2015-07-13 MED ORDER — CIPROFLOXACIN HCL 500 MG PO TABS: 500.0000 mg | ORAL_TABLET | Freq: Two times a day (BID) | ORAL | Status: DC

## 2015-07-13 NOTE — Telephone Encounter (Signed)
Pt had called regarding message yesterday that rx was needed and sent in. I explained that she was allergic to PCN so Augmentin was flagged as an allergy. Message sent to Dr. Valentino Saxon but she is out of town and I had Dr. Greggory Keen prescribed another medication: Cipro  po bid x7 days. This will be sent to pharmacy.

## 2015-07-13 NOTE — Telephone Encounter (Signed)
-----   Message from Hildred Laser, MD sent at 07/12/2015  3:48 PM EDT ----- Please contact patient and inform of Klebsiella UTI.  Needs treatment with Augmentin 500/125 mg BID x 7 days.

## 2015-07-16 ENCOUNTER — Encounter: Payer: Self-pay | Admitting: Obstetrics and Gynecology

## 2015-07-16 DIAGNOSIS — Z113 Encounter for screening for infections with a predominantly sexual mode of transmission: Secondary | ICD-10-CM

## 2015-07-16 DIAGNOSIS — Z369 Encounter for antenatal screening, unspecified: Secondary | ICD-10-CM

## 2015-08-01 ENCOUNTER — Ambulatory Visit: Payer: Managed Care, Other (non HMO)

## 2015-08-01 ENCOUNTER — Other Ambulatory Visit: Payer: Self-pay | Admitting: Obstetrics and Gynecology

## 2015-08-01 ENCOUNTER — Encounter: Payer: Managed Care, Other (non HMO) | Admitting: Obstetrics and Gynecology

## 2015-08-01 ENCOUNTER — Ambulatory Visit (INDEPENDENT_AMBULATORY_CARE_PROVIDER_SITE_OTHER): Payer: Managed Care, Other (non HMO) | Admitting: Obstetrics and Gynecology

## 2015-08-01 ENCOUNTER — Encounter: Payer: Self-pay | Admitting: Obstetrics and Gynecology

## 2015-08-01 VITALS — BP 97/65 | HR 83 | Wt 146.1 lb

## 2015-08-01 DIAGNOSIS — O09291 Supervision of pregnancy with other poor reproductive or obstetric history, first trimester: Secondary | ICD-10-CM

## 2015-08-01 DIAGNOSIS — O34219 Maternal care for unspecified type scar from previous cesarean delivery: Secondary | ICD-10-CM

## 2015-08-01 DIAGNOSIS — Z124 Encounter for screening for malignant neoplasm of cervix: Secondary | ICD-10-CM

## 2015-08-01 DIAGNOSIS — Z36 Encounter for antenatal screening of mother: Secondary | ICD-10-CM | POA: Diagnosis not present

## 2015-08-01 DIAGNOSIS — Z3491 Encounter for supervision of normal pregnancy, unspecified, first trimester: Secondary | ICD-10-CM

## 2015-08-01 DIAGNOSIS — Z3481 Encounter for supervision of other normal pregnancy, first trimester: Secondary | ICD-10-CM

## 2015-08-01 DIAGNOSIS — Z3682 Encounter for antenatal screening for nuchal translucency: Secondary | ICD-10-CM

## 2015-08-01 DIAGNOSIS — O3421 Maternal care for scar from previous cesarean delivery: Secondary | ICD-10-CM

## 2015-08-01 DIAGNOSIS — Z331 Pregnant state, incidental: Secondary | ICD-10-CM

## 2015-08-01 LAB — POCT URINALYSIS DIPSTICK
BILIRUBIN UA: NEGATIVE
GLUCOSE UA: NEGATIVE
Ketones, UA: NEGATIVE
Leukocytes, UA: NEGATIVE
Nitrite, UA: NEGATIVE
Protein, UA: NEGATIVE
RBC UA: NEGATIVE
SPEC GRAV UA: 1.01
Urobilinogen, UA: 0.2
pH, UA: 8

## 2015-08-01 LAB — OB RESULTS CONSOLE VARICELLA ZOSTER ANTIBODY, IGG: VARICELLA IGG: NON-IMMUNE/NOT IMMUNE

## 2015-08-01 LAB — OB RESULTS CONSOLE RPR: RPR: NONREACTIVE

## 2015-08-01 LAB — OB RESULTS CONSOLE HIV ANTIBODY (ROUTINE TESTING): HIV: NONREACTIVE

## 2015-08-01 NOTE — Progress Notes (Signed)
Subjective:    Vicki Cruz is being seen today for her first obstetrical visit.  This is a planned pregnancy. She is a 24 y.o. G34P1011 female at [redacted]w[redacted]d gestation (by 8 week sono, LMP unsure). Her obstetrical history is significant for recent miscarriage prior to current pregnancy. Relationship with FOB: significant other, living together. Patient does intend to breast feed. Pregnancy history fully reviewed.  Was diagnosed with UTI in NOB labs, just started   Menstrual History: Obstetric History   G3   P1   T1   P0   A1   TAB0   SAB1   E0   M0   L1     # Outcome Date GA Lbr Len/2nd Weight Sex Delivery Anes PTL Lv  3 Current           2 SAB 04/10/15     SAB   FD  1 Term 2008   7 lb 2 oz (3.232 kg) M CS-LTranv   Y     Menarche age: 59  No LMP recorded (lmp unknown). Patient is pregnant.  Denies h/o abnormal pap smears.  Cannot recall when last pap was done.   Past Medical History  Diagnosis Date  . Tobacco abuse     quit: 06/06/2015  . History of miscarriage    Past Surgical History  Procedure Laterality Date  . Cesarean section  2008    Family History  Problem Relation Age of Onset  . Cancer Paternal Grandfather     unsure of what type    Social History   Social History  . Marital Status: Single    Spouse Name: N/A  . Number of Children: 1  . Years of Education: N/A   Occupational History  . sales    Social History Main Topics  . Smoking status: Former Smoker    Quit date: 05/16/2015  . Smokeless tobacco: Never Used  . Alcohol Use: No  . Drug Use: No  . Sexual Activity:    Partners: Male   Other Topics Concern  . Not on file   Social History Narrative    Current Outpatient Prescriptions on File Prior to Visit  Medication Sig Dispense Refill  . ciprofloxacin (CIPRO) 500 MG tablet Take 1 tablet (500 mg total) by mouth 2 (two) times daily. 14 tablet 0  . Prenatal Vit-Fe Fumarate-FA (MULTIVITAMIN-PRENATAL) 27-0.8 MG TABS tablet Take 1 tablet by mouth  daily at 12 noon.    . promethazine (PHENERGAN) 25 MG tablet Take 1 tablet (25 mg total) by mouth every 6 (six) hours as needed for nausea or vomiting. 30 tablet 1   No current facility-administered medications on file prior to visit.    Allergies  Allergen Reactions  . Penicillins Rash and Nausea And Vomiting    Other reaction(s): FEVER also rash High fever,sweats    Review of Systems  General:Not Present- Fever, Weight Loss and Weight Gain. Skin:Not Present- Rash. HEENT:Not Present- Blurred Vision, Headache and Bleeding Gums. Respiratory:Not Present- Difficulty Breathing. Breast:Not Present- Breast Mass. Cardiovascular:Not Present- Chest Pain, Elevated Blood Pressure, Fainting / Blacking Out and Shortness of Breath. Gastrointestinal:Not Present- Abdominal Pain, Constipation, Nausea and Vomiting. Female Genitourinary:Not Present- Frequency, Painful Urination, Pelvic Pain, Vaginal Bleeding, Vaginal Discharge, Contractions, regular, Fetal Movements Decreased, Urinary Complaints and Vaginal Fluid. Musculoskeletal:Not Present- Back Pain and Leg Cramps. Neurological:Not Present- Dizziness. Psychiatric:Not Present- Depression.   Objective:   Blood pressure 97/65, pulse 83, weight 146 lb 1.6 oz (66.271 kg).  General Appearance:  Alert, cooperative, no distress, appears stated age  Head:    Normocephalic, without obvious abnormality, atraumatic  Eyes:    PERRL, conjunctiva/corneas clear, EOM's intact, both eyes  Ears:    Normal external ear canals, both ears  Nose:   Nares normal, septum midline, mucosa normal, no drainage or sinus tenderness  Throat:   Lips, mucosa, and tongue normal; teeth and gums normal  Neck:   Supple, symmetrical, trachea midline, no adenopathy; thyroid: no enlargement/tenderness/nodules; no carotid bruit or JVD  Back:     Symmetric, no curvature, ROM normal, no CVA tenderness  Lungs:     Clear to auscultation bilaterally, respirations unlabored   Chest Wall:    No tenderness or deformity   Heart:    Regular rate and rhythm, S1 and S2 normal, no murmur, rub or gallop  Breast Exam:    No tenderness, masses, or nipple abnormality  Abdomen:     Soft, non-tender, bowel sounds active all four quadrants, no masses, no organomegaly.   FHT 158 bpm.  Genitalia:    Pelvic:external genitalia normal, vagina with lesions, discharge, or tenderness, rectovaginal septum  normal. Cervix normal in appearance, no cervical motion tenderness, no adnexal masses or tenderness.  Pregnancy positive findings: uterine enlargement: 12 wk size, nontender.   Rectal:    Normal external sphincter.  No hemorrhoids appreciated. Internal exam not done.   Extremities:   Extremities normal, atraumatic, no cyanosis or edema  Pulses:   2+ and symmetric all extremities  Skin:   Skin color, texture, turgor normal, no rashes or lesions  Lymph nodes:   Cervical, supraclavicular, and axillary nodes normal  Neurologic:   CNII-XII intact, normal strength, sensation and reflexes throughout     Assessment:    Pregnancy at 12 and 2/7 weeks   UTI in pregnancy.  H/o miscarriage, currently pregnant H/o previous C-section   Plan:    Initial labs drawn.  Pap performed,  Prenatal vitamins. Problem list reviewed and updated. Prenatal testing, optional genetic testing, and ultrasound use in pregnancy were reviewed. AFP3 discussed: ordered. Role of ultrasound in pregnancy discussed; fetal survey: to be ordered at appropriate gestational age.   Patient to continue with current antibiotic regimen (currently on Cipro).  Will need TOC next visit.  New OB counseling:  The patient has been given an overview regarding routine prenatal care. Recommendations regarding diet, weight gain, and exercise in pregnancy were given.  Benefits of Breast Feeding were discussed. The patient is encouraged to consider nursing her baby post partum. H/o prior C-section - to discuss with patient regarding TOLAC  vs repeat C-section.  Follow up in 5 weeks.   Hildred Laser, MD [facility]

## 2015-08-02 LAB — HEPATITIS B SURFACE ANTIGEN: HEP B S AG: NEGATIVE

## 2015-08-02 LAB — ANTIBODY SCREEN: Antibody Screen: NEGATIVE

## 2015-08-02 LAB — RUBELLA ANTIBODY, IGM: Rubella IgM: <20 AU/mL (ref 0.0–19.9)

## 2015-08-02 LAB — ABO/RH: RH TYPE: POSITIVE

## 2015-08-02 LAB — CBC
HEMATOCRIT: 40.2 % (ref 34.0–46.6)
HEMOGLOBIN: 13.5 g/dL (ref 11.1–15.9)
MCH: 28.7 pg (ref 26.6–33.0)
MCHC: 33.6 g/dL (ref 31.5–35.7)
MCV: 85 fL (ref 79–97)
Platelets: 159 10*3/uL (ref 150–379)
RBC: 4.71 x10E6/uL (ref 3.77–5.28)
RDW: 14.9 % (ref 12.3–15.4)
WBC: 9.6 10*3/uL (ref 3.4–10.8)

## 2015-08-02 LAB — VARICELLA ZOSTER ANTIBODY, IGM: Varicella IgM: <0.91 index (ref 0.00–0.90)

## 2015-08-02 LAB — RPR QUALITATIVE: RPR: NONREACTIVE

## 2015-08-02 LAB — HIV ANTIBODY (ROUTINE TESTING W REFLEX): HIV SCREEN 4TH GENERATION: NONREACTIVE

## 2015-08-03 LAB — FIRST TRIMESTER SCREEN W/NT
CRL: 58.7 mm
DIA MoM: 0.61
DIA VALUE: 153.3 pg/mL
GEST AGE-COLLECT: 12.3 wk
HCG VALUE: 40.9 [IU]/mL
Maternal Age At EDD: 26.5 years
Nuchal Translucency MoM: 1.42
Nuchal Translucency: 1.8 mm
Number of Fetuses: 1
PAPP-A MOM: 1.03
PAPP-A VALUE: 957.1 ng/mL
TEST RESULTS: NEGATIVE
WEIGHT: 146 [lb_av]
hCG MoM: 0.41

## 2015-08-03 LAB — PAP IG W/ RFLX HPV ASCU: PAP SMEAR COMMENT: 0

## 2015-08-16 ENCOUNTER — Telehealth: Payer: Self-pay | Admitting: Obstetrics and Gynecology

## 2015-08-16 DIAGNOSIS — O2342 Unspecified infection of urinary tract in pregnancy, second trimester: Secondary | ICD-10-CM

## 2015-08-16 MED ORDER — CIPROFLOXACIN HCL 500 MG PO TABS: 500.0000 mg | ORAL_TABLET | Freq: Two times a day (BID) | ORAL | Status: DC

## 2015-08-16 NOTE — Telephone Encounter (Signed)
Spoke with pt she states that she is having severe UTI symptoms. Pt states she was not compliant with taking antibiotics that were prescribed to her initially for UTI. Pt states she was concerned about the antibiotic harming the baby and therefore was only taking one tablet daily and then discontinued altogether when she "felt better". Spoke with pt at length about the need to take antibiotics as needed and for the full duration. Pt gave verbal understanding. Advised pt to come in for another UA and CX. Will send in another round of antibiotics.

## 2015-08-16 NOTE — Telephone Encounter (Signed)
Pt called and she is having some mild cramping with severe lower back pain and urinating frequently, she is an OB pt. She would like a call back on what she should do.

## 2015-08-21 ENCOUNTER — Telehealth: Payer: Self-pay | Admitting: Obstetrics and Gynecology

## 2015-08-21 NOTE — Telephone Encounter (Signed)
PT CALLED AND WANTED TO KNOW THE RESULTS OF HER DOWN SYNDROME TEST/

## 2015-08-21 NOTE — Telephone Encounter (Signed)
Pt stated that she is not able to come in tomorrow (friday) for urine drop off. Pt stated she will come in on Tuesday 9/6 to drop off urine. Pt never showed up for urine drop off.

## 2015-08-23 NOTE — Telephone Encounter (Signed)
Note: Pt did not come for urine sample.

## 2015-08-24 NOTE — Telephone Encounter (Signed)
Called pt, no answer, unable to leave voicemail as it is not set up.

## 2015-08-27 NOTE — Telephone Encounter (Signed)
Informed pt of negative NT.  

## 2015-09-04 ENCOUNTER — Ambulatory Visit (INDEPENDENT_AMBULATORY_CARE_PROVIDER_SITE_OTHER): Payer: Managed Care, Other (non HMO) | Admitting: Obstetrics and Gynecology

## 2015-09-04 ENCOUNTER — Other Ambulatory Visit: Payer: Self-pay | Admitting: Obstetrics and Gynecology

## 2015-09-04 VITALS — BP 108/74 | HR 73 | Wt 144.0 lb

## 2015-09-04 DIAGNOSIS — Z331 Pregnant state, incidental: Secondary | ICD-10-CM

## 2015-09-04 DIAGNOSIS — Z3492 Encounter for supervision of normal pregnancy, unspecified, second trimester: Secondary | ICD-10-CM

## 2015-09-04 DIAGNOSIS — Z23 Encounter for immunization: Secondary | ICD-10-CM

## 2015-09-04 DIAGNOSIS — Z87440 Personal history of urinary (tract) infections: Secondary | ICD-10-CM

## 2015-09-04 LAB — POCT URINALYSIS DIPSTICK
Bilirubin, UA: NEGATIVE
Blood, UA: NEGATIVE
GLUCOSE UA: NEGATIVE
KETONES UA: NEGATIVE
LEUKOCYTES UA: NEGATIVE
Nitrite, UA: NEGATIVE
Protein, UA: NEGATIVE
SPEC GRAV UA: 1.01
Urobilinogen, UA: NEGATIVE
pH, UA: 7.5

## 2015-09-04 MED ORDER — INFLUENZA VAC SPLIT QUAD 0.5 ML IM SUSY
0.5000 mL | PREFILLED_SYRINGE | Freq: Once | INTRAMUSCULAR | Status: AC
Start: 2015-09-04 — End: 2015-09-04
  Administered 2015-09-04: 0.5 mL via INTRAMUSCULAR

## 2015-09-04 MED ORDER — ACETAMINOPHEN-CODEINE #3 300-30 MG PO TABS: 1.0000 | ORAL_TABLET | ORAL | Status: DC | PRN

## 2015-09-04 MED ORDER — INFLUENZA VAC SPLIT QUAD 0.5 ML IM SUSY: 0.5000 mL | PREFILLED_SYRINGE | INTRAMUSCULAR | Status: DC

## 2015-09-04 NOTE — Progress Notes (Signed)
ROB: Denies complaints. Discussed TOLAC vs repeat C-section, including risks and benefits.  Patient with failed IOL at 42 weeks for last pregnancy. Patient declines TOLAC, desires repeat C-section. Discussed wine/alcohol consumption in pregnancy as patient is getting married Saturday.  Advised on no more than 1 occasional glass of wine during pregnancy. Patient notes understanding. Serum AFP done today. RTC in 4 weeks.

## 2015-09-04 NOTE — Patient Instructions (Signed)
Vaginal Birth After Cesarean Delivery Vaginal birth after cesarean delivery (VBAC) is giving birth vaginally after previously delivering a baby by a cesarean. In the past, if a woman had a cesarean delivery, all births afterward would be done by cesarean delivery. This is no longer true. It can be safe for the mother to try a vaginal delivery after having a cesarean delivery.  It is important to discuss VBAC with your health care Vicki Cruz early in the pregnancy so you can understand the risks, benefits, and options. It will give you time to decide what is best in your particular case. The final decision about whether to have a VBAC or repeat cesarean delivery should be between you and your health care Vicki Cruz. Any changes in your health or your baby's health during your pregnancy may make it necessary to change your initial decision about VBAC.  WOMEN WHO PLAN TO HAVE A VBAC SHOULD CHECK WITH THEIR HEALTH CARE Vicki Cruz TO BE SURE THAT:  The previous cesarean delivery was done with a low transverse uterine cut (incision) (not a vertical classical incision).   The birth canal is big enough for the baby.   There were no other operations on the uterus.   An electronic fetal monitor (EFM) will be on at all times during labor.   An operating room will be available and ready in case an emergency cesarean delivery is needed.   A health care Vicki Cruz and surgical nursing staff will be available at all times during labor to be ready to do an emergency delivery cesarean if necessary.   An anesthesiologist will be present in case an emergency cesarean delivery is needed.   The nursery is prepared and has adequate personnel and necessary equipment available to care for the baby in case of an emergency cesarean delivery. BENEFITS OF VBAC  Shorter stay in the hospital.   Avoidance of risks associated with cesarean delivery, such as:  Surgical complications, such as opening of the incision or  hernia in the incision.  Injury to other organs.  Fever. This can occur if an infection develops after surgery. It can also occur as a reaction to the medicine given to make you numb during the surgery.  Less blood loss and need for blood transfusions.  Lower risk of blood clots and infection.  Shorter recovery.   Decreased risk for having to remove the uterus (hysterectomy).   Decreased risk for the placenta to completely or partially cover the opening of the uterus (placenta previa) with a future pregnancy.   Decrease risk in future labor and delivery. RISKS OF A VBAC  Tearing (rupture) of the uterus. This is occurs in less than 1% of VBACs. The risk of this happening is higher if:  Steps are taken to begin the labor process (induce labor) or stimulate or strengthen contractions (augment labor).   Medicine is used to soften (ripen) the cervix.  Having to remove the uterus (hysterectomy) if it ruptures. VBAC SHOULD NOT BE DONE IF:  The previous cesarean delivery was done with a vertical (classical) or T-shaped incision or you do not know what kind of incision was made.   You had a ruptured uterus.   You have had certain types of surgery on your uterus, such as removal of uterine fibroids. Ask your health care Vicki Cruz about other types of surgeries that prevent you from having a VBAC.  You have certain medical or childbirth (obstetrical) problems.   There are problems with the baby.   You   have had two previous cesarean deliveries and no vaginal deliveries. OTHER FACTS TO KNOW ABOUT VBAC:  It is safe to have an epidural anesthetic with VBAC.   It is safe to turn the baby from a breech position (attempt an external cephalic version).   It is safe to try a VBAC with twins.   VBAC may not be successful if your baby weights 8.8 lb (4 kg) or more. However, weight predictions are not always accurate and should not be used alone to decide if VBAC is right for  you.  There is an increased failure rate if the time between the cesarean delivery and VBAC is less than 19 months.   Your health care Vicki Cruz may advise against a VBAC if you have preeclampsia (high blood pressure, protein in the urine, and swelling of face and extremities).   VBAC is often successful if you previously gave birth vaginally.   VBAC is often successful when the labor starts spontaneously before the due date.   Delivering a baby through a VBAC is similar to having a normal spontaneous vaginal delivery. Document Released: 05/24/2007 Document Revised: 04/17/2014 Document Reviewed: 06/30/2013 [employer] Patient Information 2015 [employer]. This information is not intended to replace advice given to you by your health care Vicki Cruz. Make sure you discuss any questions you have with your health care Vicki Cruz.  

## 2015-09-04 NOTE — Progress Notes (Signed)
Pt getting married Saturday!

## 2015-09-07 LAB — AFP, SERUM, OPEN SPINA BIFIDA
AFP MOM: 1.08
AFP VALUE AFPOSL: 40.8 ng/mL
Gest. Age on Collection Date: 17.1 weeks
Maternal Age At EDD: 26.4 years
OSBR RISK 1 IN: 9540
Test Results:: NEGATIVE
Weight: 144 [lb_av]

## 2015-09-23 ENCOUNTER — Emergency Department
Admission: EM | Admit: 2015-09-23 | Discharge: 2015-09-23 | Disposition: A | Discharge status: Home / Self Care | Payer: Commercial Indemnity | Attending: Emergency Medicine | Admitting: Emergency Medicine

## 2015-09-23 ENCOUNTER — Encounter: Payer: Self-pay | Admitting: Medical Oncology

## 2015-09-23 ENCOUNTER — Emergency Department: Payer: Commercial Indemnity

## 2015-09-23 DIAGNOSIS — M545 Low back pain: Secondary | ICD-10-CM | POA: Insufficient documentation

## 2015-09-23 DIAGNOSIS — Z79899 Other long term (current) drug therapy: Secondary | ICD-10-CM | POA: Diagnosis not present

## 2015-09-23 DIAGNOSIS — O26899 Other specified pregnancy related conditions, unspecified trimester: Secondary | ICD-10-CM

## 2015-09-23 DIAGNOSIS — R109 Unspecified abdominal pain: Secondary | ICD-10-CM | POA: Diagnosis not present

## 2015-09-23 DIAGNOSIS — Z87891 Personal history of nicotine dependence: Secondary | ICD-10-CM | POA: Insufficient documentation

## 2015-09-23 DIAGNOSIS — O9989 Other specified diseases and conditions complicating pregnancy, childbirth and the puerperium: Secondary | ICD-10-CM | POA: Insufficient documentation

## 2015-09-23 DIAGNOSIS — Z3A19 19 weeks gestation of pregnancy: Secondary | ICD-10-CM | POA: Insufficient documentation

## 2015-09-23 DIAGNOSIS — Z880 Allergy status to penicillin: Secondary | ICD-10-CM | POA: Insufficient documentation

## 2015-09-23 DIAGNOSIS — M549 Dorsalgia, unspecified: Secondary | ICD-10-CM

## 2015-09-23 LAB — CBC
HCT: 38.7 % (ref 35.0–47.0)
HEMOGLOBIN: 13 g/dL (ref 12.0–16.0)
MCH: 29.1 pg (ref 26.0–34.0)
MCHC: 33.6 g/dL (ref 32.0–36.0)
MCV: 86.7 fL (ref 80.0–100.0)
Platelets: 131 10*3/uL — ABNORMAL LOW (ref 150–440)
RBC: 4.47 MIL/uL (ref 3.80–5.20)
RDW: 13.7 % (ref 11.5–14.5)
WBC: 13.1 10*3/uL — ABNORMAL HIGH (ref 3.6–11.0)

## 2015-09-23 LAB — WET PREP, GENITAL
CLUE CELLS WET PREP: NONE SEEN
TRICH WET PREP: NONE SEEN
Yeast Wet Prep HPF POC: NONE SEEN

## 2015-09-23 LAB — BASIC METABOLIC PANEL
ANION GAP: 9 (ref 5–15)
BUN: 9 mg/dL (ref 6–20)
CALCIUM: 9 mg/dL (ref 8.9–10.3)
CO2: 22 mmol/L (ref 22–32)
Chloride: 108 mmol/L (ref 101–111)
Creatinine, Ser: 0.59 mg/dL (ref 0.44–1.00)
Glucose, Bld: 99 mg/dL (ref 65–99)
POTASSIUM: 4.2 mmol/L (ref 3.5–5.1)
Sodium: 139 mmol/L (ref 135–145)

## 2015-09-23 LAB — URINALYSIS COMPLETE WITH MICROSCOPIC (ARMC ONLY)
BILIRUBIN URINE: NEGATIVE
GLUCOSE, UA: NEGATIVE mg/dL
HGB URINE DIPSTICK: NEGATIVE
Ketones, ur: NEGATIVE mg/dL
NITRITE: NEGATIVE
Protein, ur: NEGATIVE mg/dL
SPECIFIC GRAVITY, URINE: 1.014 (ref 1.005–1.030)
pH: 5 (ref 5.0–8.0)

## 2015-09-23 LAB — CHLAMYDIA/NGC RT PCR (ARMC ONLY)
Chlamydia Tr: NOT DETECTED
N gonorrhoeae: NOT DETECTED

## 2015-09-23 MED ORDER — SODIUM CHLORIDE 0.9 % IV BOLUS (SEPSIS)
1000.0000 mL | Freq: Once | INTRAVENOUS | Status: AC
  Administered 2015-09-23: 1000 mL via INTRAVENOUS

## 2015-09-23 MED ORDER — OXYCODONE-ACETAMINOPHEN 5-325 MG PO TABS: 1.0000 | ORAL_TABLET | ORAL | Status: DC | PRN

## 2015-09-23 MED ORDER — HYDROMORPHONE HCL 1 MG/ML IJ SOLN
1.0000 mg | Freq: Once | INTRAMUSCULAR | Status: AC
  Administered 2015-09-23: 1 mg via INTRAVENOUS
  Filled 2015-09-23: qty 1

## 2015-09-23 MED ORDER — FENTANYL CITRATE (PF) 100 MCG/2ML IJ SOLN
50.0000 ug | Freq: Once | INTRAMUSCULAR | Status: AC
  Administered 2015-09-23: 50 ug via INTRAVENOUS
  Filled 2015-09-23: qty 2

## 2015-09-23 NOTE — ED Notes (Signed)
FHR 135- when pt would have pain FHR would decrease.

## 2015-09-23 NOTE — ED Notes (Addendum)
Pt to triage with reports that she began having abdominal cramping and some back pain, this am pain has worsened. Pt is [redacted]w[redacted]d pregnant. HX of 2 miscarriages.

## 2015-09-23 NOTE — Discharge Instructions (Signed)
Please drink plenty of fluid to stay well hydrated. You may take Tylenol for mild to moderate pain, and Percocet for severe pain. Do not drive within 8 hours of taking Percocet.  Dr. Oretha Milch nurse will call you tomorrow to see how you're doing. If your symptoms have not improved, Dr. Valentino Saxon will see you on Tuesday for after your ultrasound.   If your symptoms worsen, or you develop fever, inability to keep down fluids, or any other symptoms concerning to you, please come back to the emergency department immediately.   Abdominal Pain During Pregnancy Abdominal pain is common in pregnancy. Most of the time, it does not cause harm. There are many causes of abdominal pain. Some causes are more serious than others. Some of the causes of abdominal pain in pregnancy are easily diagnosed. Occasionally, the diagnosis takes time to understand. Other times, the cause is not determined. Abdominal pain can be a sign that something is very wrong with the pregnancy, or the pain may have nothing to do with the pregnancy at all. For this reason, always tell your health care provider if you have any abdominal discomfort. HOME CARE INSTRUCTIONS  Monitor your abdominal pain for any changes. The following actions may help to alleviate any discomfort you are experiencing:  Do not have sexual intercourse or put anything in your vagina until your symptoms go away completely.  Get plenty of rest until your pain improves.  Drink clear fluids if you feel nauseous. Avoid solid food as long as you are uncomfortable or nauseous.  Only take over-the-counter or prescription medicine as directed by your health care provider.  Keep all follow-up appointments with your health care provider. SEEK IMMEDIATE MEDICAL CARE IF:  You are bleeding, leaking fluid, or passing tissue from the vagina.  You have increasing pain or cramping.  You have persistent vomiting.  You have painful or bloody urination.  You have a  fever.  You notice a decrease in your baby's movements.  You have extreme weakness or feel faint.  You have shortness of breath, with or without abdominal pain.  You develop a severe headache with abdominal pain.  You have abnormal vaginal discharge with abdominal pain.  You have persistent diarrhea.  You have abdominal pain that continues even after rest, or gets worse. MAKE SURE YOU:   Understand these instructions.  Will watch your condition.  Will get help right away if you are not doing well or get worse.   This information is not intended to replace advice given to you by your health care provider. Make sure you discuss any questions you have with your health care provider.   Document Released: 12/01/2005 Document Revised: 09/21/2013 Document Reviewed: 06/30/2013 Elsevier Interactive Patient Education [employer]

## 2015-09-23 NOTE — ED Notes (Signed)
Patient transported to Ultrasound 

## 2015-09-23 NOTE — ED Provider Notes (Signed)
[facility] Emergency Department Provider Note  ____________________________________________  Time seen: Approximately 3:36 PM  I have reviewed the triage vital signs and the nursing notes.   HISTORY  Chief Complaint Abdominal Pain    HPI Vicki Cruz is a 24 y.o. female G3 P1 A1 [redacted] weeks pregnant by LMP presenting for abdominal and back pain. Patient reports that last night she developed severe abdominal pain and "sharp and crampy" lower abdominal pain. This persisted throughout the morning today, then she developed a "bandlike" tight feeling around the umbilicus. She denies any vaginal discharge, gush of fluid, vaginal bleeding, hematuria, urinary frequency or burning, fever, nausea or vomiting, diarrhea.  She does report that she has felt the fetus move.   Previous spontaneous abortion that occurred at 2 weeks.  Previous laparoscopic removal of Mirena IUD which had transversed the uterus into the abdominal cavity.  History of one full-term pregnancy status post cesarean section; pregnancy was complicated by early single twin loss.  Past Medical History  Diagnosis Date  . Tobacco abuse     quit: 06/06/2015  . History of miscarriage     Patient Active Problem List   Diagnosis Date Noted  . Tobacco abuse 06/11/2015  . H/O miscarriage, currently pregnant 06/11/2015  . Bartholin cyst 06/30/2013    Past Surgical History  Procedure Laterality Date  . Cesarean section  2008    Current Outpatient Rx  Name  Route  Sig  Dispense  Refill  . acetaminophen (TYLENOL) 500 MG tablet   Oral   Take 500-1,000 mg by mouth every 6 (six) hours as needed for mild pain, moderate pain or headache.         . Prenatal Vit-Fe Fumarate-FA (MULTIVITAMIN-PRENATAL) 27-0.8 MG TABS tablet   Oral   Take 1 tablet by mouth daily at 12 noon.         . ciprofloxacin (CIPRO) 500 MG tablet   Oral   Take 1 tablet (500 mg total) by mouth 2 (two) times  daily. Patient not taking: Reported on 09/04/2015   14 tablet   0   . oxyCODONE-acetaminophen (ROXICET) 5-325 MG tablet   Oral   Take 1 tablet by mouth every 4 (four) hours as needed.   15 tablet   0   . promethazine (PHENERGAN) 25 MG tablet   Oral   Take 1 tablet (25 mg total) by mouth every 6 (six) hours as needed for nausea or vomiting. Patient not taking: Reported on 09/04/2015   30 tablet   1     Allergies Penicillins  Family History  Problem Relation Age of Onset  . Cancer Paternal Grandfather     unsure of what type    Social History Social History  Substance Use Topics  . Smoking status: Former Smoker    Quit date: 05/16/2015  . Smokeless tobacco: Never Used  . Alcohol Use: No    Review of Systems Constitutional: No fever/chills. No lightheadedness or syncope. Eyes: No visual changes. ENT: No sore throat. Cardiovascular: Denies chest pain, palpitations. Respiratory: Denies shortness of breath.  No cough. Gastrointestinal: Lower abdominal pain.  No nausea, no vomiting.  No diarrhea.  No constipation. Genitourinary: Negative for dysuria. Negative for vaginal discharge or vaginal bleeding. Musculoskeletal: Positive for low back pain. Skin: Negative for rash. Neurological: Negative for headaches, focal weakness or numbness.  10-point ROS otherwise negative.  ____________________________________________   PHYSICAL EXAM:  VITAL SIGNS: ED Triage Vitals  Enc Vitals Group  BP 09/23/15 1449 134/77 mmHg     Pulse Rate 09/23/15 1449 81     Resp 09/23/15 1449 18     Temp 09/23/15 1449 98.1 F (36.7 C)     Temp Source 09/23/15 1449 Oral     SpO2 09/23/15 1449 100 %     Weight 09/23/15 1449 144 lb (65.318 kg)     Height 09/23/15 1449  (1.651 m)     Head Cir --      Peak Flow --      Pain Score 09/23/15 1450 9     Pain Loc --      Pain Edu? --      Excl. in GC? --     Constitutional: Alert and oriented. Uncomfortable appearing.Marland Kitchen Answer  question appropriately. Eyes: Conjunctivae are normal.  EOMI. Head: Atraumatic. Nose: No congestion/rhinnorhea. Mouth/Throat: Mucous membranes are moist.  Neck: No stridor.  Supple.   Cardiovascular: Normal rate, regular rhythm. No murmurs, rubs or gallops.  Respiratory: Normal respiratory effort.  No retractions. Lungs CTAB.  No wheezes, rales or ronchi. Gastrointestinal: Soft, nondistended. Gravid  To 2 cm below the umbilicus. No peritoneal signs. Musculoskeletal: No LE edema.  Neurologic:  Normal speech and language. No gross focal neurologic deficits are appreciated.  Skin:  Skin is warm, dry and intact. No rash noted. Psychiatric: Mood and affect are normal. Speech and behavior are normal.  Normal judgement.  ____________________________________________   LABS (all labs ordered are listed, but only abnormal results are displayed)  Labs Reviewed  WET PREP, GENITAL - Abnormal; Notable for the following:    WBC, Wet Prep HPF POC MODERATE (*)    All other components within normal limits  CBC - Abnormal; Notable for the following:    WBC 13.1 (*)    Platelets 131 (*)    All other components within normal limits  URINALYSIS COMPLETEWITH MICROSCOPIC ([facility] ONLY) - Abnormal; Notable for the following:    Color, Urine YELLOW (*)    APPearance HAZY (*)    Leukocytes, UA TRACE (*)    Bacteria, UA RARE (*)    Squamous Epithelial / LPF 6-30 (*)    All other components within normal limits  CHLAMYDIA/NGC RT PCR ([facility] ONLY)  BASIC METABOLIC PANEL   ____________________________________________  EKG  Not indicated ____________________________________________  RADIOLOGY  US Ob Limited  09/23/2015   CLINICAL DATA:  Patient with lower abdominal pain for 1 day.  EXAM: LIMITED OBSTETRIC ULTRASOUND  FINDINGS: Number of Fetuses: 1  Heart Rate:  147 bpm  Movement: Yes  Presentation: Breech  Placental Location: Posterior  Previa: No  Amniotic Fluid (Subjective):  Within normal  limits.  BPD:  4.4cm 19w  2d  MATERNAL FINDINGS:  Cervix:  Appears closed.  Uterus/Adnexae:  No abnormality visualized.  IMPRESSION: Single live intrauterine gestation. No acute abnormality identified.  This exam is performed on an emergent basis and does not comprehensively evaluate fetal size, dating, or anatomy; follow-up complete OB US should be considered if further fetal assessment is warranted.   Electronically Signed   By: Annia Belt M.D.   On: 09/23/2015 17:26    ____________________________________________   PROCEDURES  Procedure(s) performed: None  Critical Care performed: No ____________________________________________   INITIAL IMPRESSION / ASSESSMENT AND PLAN / ED COURSE  Pertinent labs & imaging results that were available during my care of the patient were reviewed by me and considered in my medical decision making (see chart for details).  24 y.o. G3 P1 A1  proximal 919 weeks pregnant presenting with severe low back and lower abdominal pain. She has not had any vaginal discharge or bleeding, the differential includes abortion, vaginal infection, urinary tract infection. Other abdominal pathologies include appendicitis, diverticulitis.  Will initiate lab works, IV fluids, fetal heart tones, and perform pelvic exam.  ----------------------------------------- 4:25 PM on 09/23/2015 -----------------------------------------  Pelvic exam shows; normal appearing external genitalia without lesions. Speculum exam reveals a thin white non-malodorous vaginal discharge with normal appearance of the cervix. No vaginal bleeding. Bimanual exam reveals closed cervix, mild CMT, no right adnexal tenderness, mild left adnexal tenderness, gravid uterus with minimal tenderness in the suprapubic area.   The patient continues to have some pain after fentanyl, so I will treat her with Dilaudid and continued IV fluid. Her fetal heart tones were in the 160s which is appropriate for this stage of  pregnancy.  ----------------------------------------- 5:46 PM on 09/23/2015 -----------------------------------------  I have reevaluated the patient who is currently pain-free. Her workup here has been negative. It is possible that her pain is due to round ligament pain, or dehydration, but there is no evidence of acute pregnancy related or intra-abdominal pathology at this time. I have given her appendectomy precautions and we have discussed the risks and benefits of CT evaluation for this condition. While it is on the differential, it is unlikely that this patient has appendicitis. I have given her return precautions. I have spoken with her OB/GYN, Dr. Valentino Saxon, who will follow up with her in the next 1-2 days. She understands follow-up and return instructions. An discharge.  FINAL CLINICAL IMPRESSION(S) / ED DIAGNOSES  Final diagnoses:  Abdominal pain in pregnancy  Back pain in pregnancy      NEW MEDICATIONS STARTED DURING THIS VISIT:  New Prescriptions   OXYCODONE-ACETAMINOPHEN (ROXICET) 5-325 MG TABLET    Take 1 tablet by mouth every 4 (four) hours as needed.     Rockne Menghini, MD 09/23/15 1747

## 2015-09-23 NOTE — ED Notes (Signed)
D/C instructions reviewed with pt, who voiced understanding. Departed in wheelchair with SO, and in NAD.

## 2015-09-24 ENCOUNTER — Emergency Department: Payer: Managed Care, Other (non HMO)

## 2015-09-24 ENCOUNTER — Encounter: Payer: Self-pay | Admitting: Emergency Medicine

## 2015-09-24 ENCOUNTER — Telehealth: Payer: Self-pay

## 2015-09-24 ENCOUNTER — Observation Stay
Admission: EM | Admit: 2015-09-24 | Discharge: 2015-09-25 | Disposition: A | Discharge status: Home / Self Care | Payer: Managed Care, Other (non HMO) | Attending: Obstetrics and Gynecology | Admitting: Obstetrics and Gynecology

## 2015-09-24 DIAGNOSIS — M549 Dorsalgia, unspecified: Secondary | ICD-10-CM | POA: Diagnosis present

## 2015-09-24 DIAGNOSIS — O99119 Other diseases of the blood and blood-forming organs and certain disorders involving the immune mechanism complicating pregnancy, unspecified trimester: Secondary | ICD-10-CM

## 2015-09-24 DIAGNOSIS — D696 Thrombocytopenia, unspecified: Secondary | ICD-10-CM | POA: Diagnosis not present

## 2015-09-24 DIAGNOSIS — Z880 Allergy status to penicillin: Secondary | ICD-10-CM | POA: Insufficient documentation

## 2015-09-24 DIAGNOSIS — O219 Vomiting of pregnancy, unspecified: Secondary | ICD-10-CM | POA: Diagnosis not present

## 2015-09-24 DIAGNOSIS — Z3A20 20 weeks gestation of pregnancy: Secondary | ICD-10-CM | POA: Diagnosis not present

## 2015-09-24 DIAGNOSIS — O99891 Other specified diseases and conditions complicating pregnancy: Secondary | ICD-10-CM

## 2015-09-24 DIAGNOSIS — Z87891 Personal history of nicotine dependence: Secondary | ICD-10-CM | POA: Insufficient documentation

## 2015-09-24 DIAGNOSIS — Z9889 Other specified postprocedural states: Secondary | ICD-10-CM | POA: Insufficient documentation

## 2015-09-24 DIAGNOSIS — O2692 Pregnancy related conditions, unspecified, second trimester: Secondary | ICD-10-CM | POA: Diagnosis not present

## 2015-09-24 DIAGNOSIS — Z809 Family history of malignant neoplasm, unspecified: Secondary | ICD-10-CM | POA: Diagnosis not present

## 2015-09-24 DIAGNOSIS — O99112 Other diseases of the blood and blood-forming organs and certain disorders involving the immune mechanism complicating pregnancy, second trimester: Secondary | ICD-10-CM | POA: Insufficient documentation

## 2015-09-24 DIAGNOSIS — O9989 Other specified diseases and conditions complicating pregnancy, childbirth and the puerperium: Secondary | ICD-10-CM

## 2015-09-24 DIAGNOSIS — N1330 Unspecified hydronephrosis: Secondary | ICD-10-CM

## 2015-09-24 DIAGNOSIS — M545 Low back pain: Secondary | ICD-10-CM | POA: Diagnosis not present

## 2015-09-24 DIAGNOSIS — Z87440 Personal history of urinary (tract) infections: Secondary | ICD-10-CM | POA: Insufficient documentation

## 2015-09-24 DIAGNOSIS — O26899 Other specified pregnancy related conditions, unspecified trimester: Secondary | ICD-10-CM | POA: Diagnosis not present

## 2015-09-24 LAB — COMPREHENSIVE METABOLIC PANEL
ALBUMIN: 3.2 g/dL — AB (ref 3.5–5.0)
ALT: 12 U/L — AB (ref 14–54)
AST: 16 U/L (ref 15–41)
Alkaline Phosphatase: 49 U/L (ref 38–126)
Anion gap: 1 — ABNORMAL LOW (ref 5–15)
BUN: <5 mg/dL — ABNORMAL LOW (ref 6–20)
CHLORIDE: 114 mmol/L — AB (ref 101–111)
CO2: 21 mmol/L — AB (ref 22–32)
CREATININE: 0.4 mg/dL — AB (ref 0.44–1.00)
Calcium: 8.1 mg/dL — ABNORMAL LOW (ref 8.9–10.3)
GFR calc Af Amer: >60 mL/min (ref >60)
GFR calc non Af Amer: >60 mL/min (ref >60)
GLUCOSE: 87 mg/dL (ref 65–99)
POTASSIUM: 3.9 mmol/L (ref 3.5–5.1)
SODIUM: 136 mmol/L (ref 135–145)
Total Bilirubin: 0.3 mg/dL (ref 0.3–1.2)
Total Protein: 6.2 g/dL — ABNORMAL LOW (ref 6.5–8.1)

## 2015-09-24 LAB — CBC WITH DIFFERENTIAL/PLATELET
Basophils Absolute: 0 10*3/uL (ref 0–0.1)
Basophils Relative: 0 %
EOS ABS: 0 10*3/uL (ref 0–0.7)
EOS PCT: 0 %
HCT: 36.5 % (ref 35.0–47.0)
HEMOGLOBIN: 12.6 g/dL (ref 12.0–16.0)
LYMPHS ABS: 1.7 10*3/uL (ref 1.0–3.6)
LYMPHS PCT: 15 %
MCH: 29.9 pg (ref 26.0–34.0)
MCHC: 34.6 g/dL (ref 32.0–36.0)
MCV: 86.5 fL (ref 80.0–100.0)
MONOS PCT: 6 %
Monocytes Absolute: 0.7 10*3/uL (ref 0.2–0.9)
Neutro Abs: 8.5 10*3/uL — ABNORMAL HIGH (ref 1.4–6.5)
Neutrophils Relative %: 79 %
PLATELETS: 125 10*3/uL — AB (ref 150–440)
RBC: 4.22 MIL/uL (ref 3.80–5.20)
RDW: 13.9 % (ref 11.5–14.5)
WBC: 11 10*3/uL (ref 3.6–11.0)

## 2015-09-24 LAB — URINALYSIS COMPLETE WITH MICROSCOPIC (ARMC ONLY)
Bilirubin Urine: NEGATIVE
Glucose, UA: NEGATIVE mg/dL
Hgb urine dipstick: NEGATIVE
KETONES UR: NEGATIVE mg/dL
Leukocytes, UA: NEGATIVE
Nitrite: NEGATIVE
PH: 7 (ref 5.0–8.0)
PROTEIN: NEGATIVE mg/dL
Specific Gravity, Urine: 1.011 (ref 1.005–1.030)

## 2015-09-24 MED ORDER — MORPHINE SULFATE (PF) 4 MG/ML IV SOLN
INTRAVENOUS | Status: AC
  Filled 2015-09-24: qty 1

## 2015-09-24 MED ORDER — MORPHINE SULFATE (PF) 4 MG/ML IV SOLN
INTRAVENOUS | Status: AC
  Administered 2015-09-24: 4 mg via INTRAVENOUS
  Filled 2015-09-24: qty 1

## 2015-09-24 MED ORDER — NITROFURANTOIN MONOHYD MACRO 100 MG PO CAPS
100.0000 mg | ORAL_CAPSULE | Freq: Two times a day (BID) | ORAL | Status: DC
  Administered 2015-09-24 – 2015-09-25 (×2): 100 mg via ORAL
  Filled 2015-09-24 (×2): qty 1

## 2015-09-24 MED ORDER — ONDANSETRON HCL 4 MG/2ML IJ SOLN
INTRAMUSCULAR | Status: AC
  Administered 2015-09-24: 4 mg
  Filled 2015-09-24: qty 2

## 2015-09-24 MED ORDER — LACTATED RINGERS IV SOLN
INTRAVENOUS | Status: DC
  Administered 2015-09-24 – 2015-09-25 (×3): via INTRAVENOUS

## 2015-09-24 MED ORDER — ACETAMINOPHEN 325 MG PO TABS
650.0000 mg | ORAL_TABLET | ORAL | Status: DC | PRN
  Administered 2015-09-25: 650 mg via ORAL
  Filled 2015-09-24: qty 2

## 2015-09-24 MED ORDER — DOCUSATE SODIUM 100 MG PO CAPS
100.0000 mg | ORAL_CAPSULE | Freq: Every day | ORAL | Status: DC
  Administered 2015-09-24 – 2015-09-25 (×2): 100 mg via ORAL
  Filled 2015-09-24 (×2): qty 1

## 2015-09-24 MED ORDER — ONDANSETRON 4 MG PO TBDP: 4.0000 mg | ORAL_TABLET | Freq: Three times a day (TID) | ORAL | Status: DC | PRN

## 2015-09-24 MED ORDER — PRENATAL MULTIVITAMIN CH
1.0000 | ORAL_TABLET | Freq: Every day | ORAL | Status: DC
  Administered 2015-09-25: 1 via ORAL
  Filled 2015-09-24: qty 1

## 2015-09-24 MED ORDER — MORPHINE SULFATE (PF) 4 MG/ML IV SOLN
4.0000 mg | Freq: Once | INTRAVENOUS | Status: AC
  Administered 2015-09-24 (×2): 4 mg via INTRAVENOUS

## 2015-09-24 MED ORDER — MORPHINE SULFATE (PF) 4 MG/ML IV SOLN
4.0000 mg | INTRAVENOUS | Status: DC | PRN
  Administered 2015-09-25: 4 mg via INTRAVENOUS
  Filled 2015-09-24: qty 1

## 2015-09-24 MED ORDER — HYDROMORPHONE HCL 2 MG PO TABS
2.0000 mg | ORAL_TABLET | ORAL | Status: DC
  Administered 2015-09-24 – 2015-09-25 (×4): 2 mg via ORAL
  Filled 2015-09-24 (×4): qty 1

## 2015-09-24 MED ORDER — PROMETHAZINE HCL 25 MG PO TABS: 25.0000 mg | ORAL_TABLET | Freq: Four times a day (QID) | ORAL | Status: DC | PRN

## 2015-09-24 MED ORDER — SODIUM CHLORIDE 0.9 % IV SOLN
Freq: Once | INTRAVENOUS | Status: AC
  Administered 2015-09-24: 20:00:00 via INTRAVENOUS

## 2015-09-24 MED ORDER — CEFTRIAXONE SODIUM 1 G IJ SOLR
1.0000 g | INTRAMUSCULAR | Status: DC
  Administered 2015-09-24: 1 g via INTRAVENOUS
  Filled 2015-09-24 (×2): qty 10

## 2015-09-24 MED ORDER — OXYCODONE-ACETAMINOPHEN 5-325 MG PO TABS
2.0000 | ORAL_TABLET | Freq: Once | ORAL | Status: AC
  Administered 2015-09-24: 2 via ORAL
  Filled 2015-09-24: qty 2

## 2015-09-24 MED ORDER — ONDANSETRON HCL 4 MG/2ML IJ SOLN
4.0000 mg | Freq: Once | INTRAMUSCULAR | Status: AC
  Administered 2015-09-24: 4 mg via INTRAVENOUS
  Filled 2015-09-24: qty 2

## 2015-09-24 MED ORDER — ZOLPIDEM TARTRATE 5 MG PO TABS: 5.0000 mg | ORAL_TABLET | Freq: Every evening | ORAL | Status: DC | PRN

## 2015-09-24 NOTE — H&P (Addendum)
Subjective:  Patient 24 y.o. G3P1024fmale at 2109w0dho presented to the Emergency Room with complaints of back pain x 2 days.  Onset of symptoms was abrupt starting 2 days ago with gradually worsening course since that time. The pain is located in the left flank and in the right flank without radiation. Patient describes the pain as aching, colicky, sharp, stabbing, continuous and rated as severe. Pain has been associated with abdominal pain, nausea and vomiting. Patient denies constipation, diarrhea and reflux symptoms. Symptoms are aggravated by none. Symptoms improve with none. Past history includes urinary tract infections (last UTI 06/2015, Klebsiella). Previous studies include ultrasound (abdominal and renal).   Of note, patient was seen yesterday with similar complaints, except also noted tight band-like feeling around umbilicus. Labs and fetal ultrasound wnl except UA with WBCs. Was discharged home with Percocet, Phenergan, and Cipro (after being treated in ER with Morphine for pain).  Patient notes that once she went home, pain returned, and was uncontrolled with Percocet.   Patient Active Problem List   Diagnosis Date Noted  . Tobacco abuse 06/11/2015  . H/O miscarriage, currently pregnant 06/11/2015  . Bartholin cyst 06/30/2013   Past Medical History  Diagnosis Date  . Tobacco abuse     quit: 06/06/2015  . History of miscarriage     Past Surgical History  Procedure Laterality Date  . Cesarean section  2008    Family History  Problem Relation Age of Onset  . Cancer Paternal Grandfather     unsure of what type    (Not in a hospital admission) Allergies  Allergen Reactions  . Penicillins Nausea And Vomiting and Rash    Other reaction(s): FEVER also rash High fever,sweats Has patient had a PCN reaction causing immediate rash, facial/tongue/throat swelling, SOB or lightheadedness with hypotension: Yes Has patient had a PCN reaction causing severe rash involving mucus membranes  or skin necrosis: No Has patient had a PCN reaction that required hospitalization No Has patient had a PCN reaction occurring within the last 10 years: No If all of the above answers are "NO", then may proceed with Cephalosporin use.    Social History  Substance Use Topics  . Smoking status: Former Smoker    Quit date: 05/16/2015  . Smokeless tobacco: Never Used  . Alcohol Use: No    Family History  Problem Relation Age of Onset  . Cancer Paternal Grandfather     unsure of what type     No current facility-administered medications on file prior to encounter.   Current Outpatient Prescriptions on File Prior to Encounter  Medication Sig Dispense Refill  . acetaminophen (TYLENOL) 500 MG tablet Take 500-1,000 mg by mouth every 6 (six) hours as needed for mild pain or headache.     . oxyCODONE-acetaminophen (ROXICET) 5-325 MG tablet Take 1 tablet by mouth every 4 (four) hours as needed. (Patient taking differently: Take 1 tablet by mouth every 4 (four) hours as needed for severe pain. ) 15 tablet 0    Review of Systems Constitutional: No fever/chills. No lightheadedness or syncope. Eyes: No visual changes. ENT: No sore throat. Cardiovascular: No chest pain, palpitations. Respiratory: No shortness of breath. No cough. Gastrointestinal: Positive for nausea and vomiting.No lower abdominal pain. No diarrhea. No constipation. Genitourinary: Negative for dysuria. Negative for vaginal discharge or vaginal bleeding. Musculoskeletal: Positive for low back pain. Skin: Negative for rash. Neurological: Negative for headaches, focal weakness or numbness.  Objective:   Patient Vitals for the past 8 hrs:  BP Temp Temp src Pulse Resp SpO2  09/24/15 1852 106/65 mmHg - - 69 20 100 %  09/24/15 1700 104/66 mmHg - - 83 18 100 %  09/24/15 1507 120/81 mmHg 98.3 F (36.8 C) Oral 72 20 100 %   General appearance: alert and mild distress Head: Normocephalic, without obvious abnormality,  atraumatic Eyes: negative findings: conjunctivae and sclerae normal and pupils equal, round, reactive to light and accomodation Ears: normal external ear canals, both ears Nose: Nares normal. Septum midline. Mucosa normal. No drainage or sinus tenderness. Throat: lips, mucosa, and tongue normal; teeth and gums normal Neck: no adenopathy, no carotid bruit, no JVD, supple, symmetrical, trachea midline and thyroid not enlarged, symmetric, no tenderness/mass/nodules Back: bilateral CVA tenderness, right greater than left Lungs: clear to auscultation bilaterally Heart: regular rate and rhythm, S1, S2 normal, no murmur, click, rub or gallop Abdomen: soft, non-tender; bowel sounds normal; no masses,  no organomegaly Pelvic: deferred Extremities: extremities normal, atraumatic, no cyanosis or edema Skin: Skin color, texture, turgor normal. No rashes or lesions Neurologic: Grossly normal   Data Review Results for SMT., LODER (MRN [id number]) as of 09/24/2015 21:09  Ref. Range 09/23/2015 14:52 09/23/2015 16:21 09/24/2015 15:29 09/24/2015 15:30  Sodium Latest Ref Range: 135-145 mmol/L 139   136  Potassium Latest Ref Range: 3.5-5.1 mmol/L 4.2   3.9  Chloride Latest Ref Range: 101-111 mmol/L 108   114 (H)  CO2 Latest Ref Range: 22-32 mmol/L 22   21 (L)  BUN Latest Ref Range: 6-20 mg/dL 9   <5 (L)  Creatinine Latest Ref Range: 0.44-1.00 mg/dL 0.59   0.40 (L)  Calcium Latest Ref Range: 8.9-10.3 mg/dL 9.0   8.1 (L)  EGFR (Non-African Amer.) Latest Ref Range: >60 mL/min >60   >60  EGFR (African American) Latest Ref Range: >60 mL/min >60   >60  Glucose Latest Ref Range: 65-99 mg/dL 99   87  Anion gap Latest Ref Range: 5-15  9   1  (L)  Alkaline Phosphatase Latest Ref Range: 38-126 U/L    49  Albumin Latest Ref Range: 3.5-5.0 g/dL    3.2 (L)  AST Latest Ref Range: 15-41 U/L    16  ALT Latest Ref Range: 14-54 U/L    12 (L)  Total Protein Latest Ref Range: 6.5-8.1 g/dL    6.2 (L)  Total  Bilirubin Latest Ref Range: 0.3-1.2 mg/dL    0.3  WBC Latest Ref Range: 3.6-11.0 K/uL 13.1 (H)   11.0  RBC Latest Ref Range: 3.80-5.20 MIL/uL 4.47   4.22  Hemoglobin Latest Ref Range: 12.0-16.0 g/dL 13.0   12.6  HCT Latest Ref Range: 35.0-47.0 % 38.7   36.5  MCV Latest Ref Range: 80.0-100.0 fL 86.7   86.5  MCH Latest Ref Range: 26.0-34.0 pg 29.1   29.9  MCHC Latest Ref Range: 32.0-36.0 g/dL 33.6   34.6  RDW Latest Ref Range: 11.5-14.5 % 13.7   13.9  Platelets Latest Ref Range: 150-440 K/uL 131 (L)   125 (L)  Neutrophils Latest Units: %    79  Lymphocytes Latest Units: %    15  Monocytes Relative Latest Units: %    6  Eosinophil Latest Units: %    0  Basophil Latest Units: %    0  NEUT# Latest Ref Range: 1.4-6.5 K/uL    8.5 (H)  Lymphocyte # Latest Ref Range: 1.0-3.6 K/uL    1.7  Monocyte # Latest Ref Range: 0.2-0.9 K/uL    0.7  Eosinophils  Absolute Latest Ref Range: 0-0.7 K/uL    0.0  Basophils Absolute Latest Ref Range: 0-0.1 K/uL    0.0  Specimen source GC/Chlam Unknown  ENDOCERVICAL    Chlamydia Tr Latest Ref Range: NOT DETECTED   NOT DETECTED    N gonorrhoeae Latest Ref Range: NOT DETECTED   NOT DETECTED    Yeast Wet Prep HPF POC Latest Ref Range: NONE SEEN   NONE SEEN    Trich, Wet Prep Latest Ref Range: NONE SEEN   NONE SEEN    Clue Cells Wet Prep HPF POC Latest Ref Range: NONE SEEN   NONE SEEN    WBC, Wet Prep HPF POC Latest Ref Range: NONE SEEN   MODERATE (A)    Appearance Latest Ref Range: CLEAR  HAZY (A)  CLEAR (A)   Bacteria, UA Latest Ref Range: NONE SEEN  RARE (A)  RARE (A)   Bilirubin Urine Latest Ref Range: NEGATIVE  NEGATIVE  NEGATIVE   Color, Urine Latest Ref Range: YELLOW  YELLOW (A)  STRAW (A)   Glucose Latest Ref Range: NEGATIVE mg/dL NEGATIVE  NEGATIVE   Hgb urine dipstick Latest Ref Range: NEGATIVE  NEGATIVE  NEGATIVE   Ketones, ur Latest Ref Range: NEGATIVE mg/dL NEGATIVE  NEGATIVE   Leukocytes, UA Latest Ref Range: NEGATIVE  TRACE (A)  NEGATIVE   Mucous  Unknown PRESENT  PRESENT   Nitrite Latest Ref Range: NEGATIVE  NEGATIVE  NEGATIVE   pH Latest Ref Range: 5.0-8.0  5.0  7.0   Protein Latest Ref Range: NEGATIVE mg/dL NEGATIVE  NEGATIVE   RBC / HPF Latest Ref Range: 0-5 RBC/hpf 0-5  0-5   Specific Gravity, Urine Latest Ref Range: 1.005-1.030  1.014  1.011   Squamous Epithelial / LPF Latest Ref Range: NONE SEEN  6-30 (A)  0-5 (A)   WBC, UA Latest Ref Range: 0-5 WBC/hpf 6-30  0-5     Radiology review:  09/23/2015 Limited Fetal Ultrasound:  LIMITED OBSTETRIC ULTRASOUND  FINDINGS: Number of Fetuses: 1.  Heart Rate: 147 bpm Movement: Yes Presentation: Breech Placental Location: Posterior.  Previa: No Amniotic Fluid (Subjective): Within normal limits. BPD: 4.4cm 19w 2d  MATERNAL FINDINGS: Cervix: Appears closed. Uterus/Adnexae: No abnormality visualized.  IMPRESSION: Single live intrauterine gestation. No acute abnormality identified.   09/24/2015 Renal Ultrasound: FINDINGS: Right Kidney: Length: 12.5 cm. There is mild pelvocaliectasis on the right. No stone is evident. There is no cystic or solid mass. The cortical echotexture is normal.  Left Kidney: Length: 11.4 cm. There is no hydronephrosis. The renal cortical echotexture is normal.  Bladder: Appears normal for degree of bladder distention. Bilateral ureteral jets are observed.  Assessment: Low back pain suspicious for pyelonephritis. No evidence of renal stones. Mild right hydronephrosis H/o UTIs Thrombocytopenia, likely gestational  Plan: Patient's pain uncontrolled by PO medications at home and in Emergency Room.  Will admit under observation for IV pain control.  Received dose of Rocephin in ER.  Will intiate on Macrobid. Pending urine culture. Nausea/vomiting, controlled with Phenergan. Fetal status reassuring.  Will assess FHT daily.  Thrombocytopenia mild, no workup needed, but will follow throughout pregnancy to see if further workup needed.     Rubie Maid, MD [facility]

## 2015-09-24 NOTE — ED Notes (Signed)
MD at bedside. 

## 2015-09-24 NOTE — ED Notes (Signed)
Pt to ed with c/o abd pain and back pain bilat lower in flank areas.  Pt is [redacted] weeks pregnant and was seen here yesterday for the same.  Pt states was told to come back to ed if symptoms got worse.  Pt reports vomiting and increased back pain.

## 2015-09-24 NOTE — ED Provider Notes (Signed)
[facility] Emergency Department Provider Note  ____________________________________________  Time seen: Approximately 3:19 PM  I have reviewed the triage vital signs and the nursing notes.   HISTORY  Chief Complaint Abdominal Pain    HPI Jaylani Louanne Belton is a 24 y.o. female patient seen in the ER here yesterday for similar complaints I reviewed the note from yesterday. today really is a continuation of note from yesterday. Patient reports her abdominal pain has resolved. However she is having right sided CVA tenderness at the present time moderately severe in nature gradually worsening since yesterday. Achy and maybe little bit crampy as well. Patient denies any fever or vomiting. Ultrasound yesterday looked good patient had some white cells in the urine. White count is minimally elevated which could be consistent with her pregnancy.   Past Medical History  Diagnosis Date  . Tobacco abuse     quit: 06/06/2015  . History of miscarriage     Patient Active Problem List   Diagnosis Date Noted  . Back pain affecting pregnancy in second trimester 09/24/2015  . Tobacco abuse 06/11/2015  . H/O miscarriage, currently pregnant 06/11/2015  . Bartholin cyst 06/30/2013    Past Surgical History  Procedure Laterality Date  . Cesarean section  2008    Current Outpatient Rx  Name  Route  Sig  Dispense  Refill  . acetaminophen (TYLENOL) 500 MG tablet   Oral   Take 500-1,000 mg by mouth every 6 (six) hours as needed for mild pain or headache.          . oxyCODONE-acetaminophen (ROXICET) 5-325 MG tablet   Oral   Take 1 tablet by mouth every 4 (four) hours as needed. Patient taking differently: Take 1 tablet by mouth every 4 (four) hours as needed for severe pain.    15 tablet   0   . promethazine (PHENERGAN) 25 MG tablet   Oral   Take 25 mg by mouth every 6 (six) hours as needed for nausea or vomiting.           Allergies Penicillins  Family  History  Problem Relation Age of Onset  . Cancer Paternal Grandfather     unsure of what type    Social History Social History  Substance Use Topics  . Smoking status: Former Smoker    Quit date: 05/16/2015  . Smokeless tobacco: Never Used  . Alcohol Use: No    Review of Systems Constitutional: No fever/chills Eyes: No visual changes. ENT: No sore throat. Cardiovascular: Denies chest pain. Respiratory: Denies shortness of breath. Gastrointestinal: No abdominal pain.  No nausea, no vomiting.  No diarrhea.  No constipation. Genitourinary: Negative for dysuria. Musculoskeletal: Right CVA pain Skin: Negative for rash. Neurological: Negative for headaches, focal weakness or numbness.  10-point ROS otherwise negative.  ____________________________________________   PHYSICAL EXAM:  VITAL SIGNS: ED Triage Vitals  Enc Vitals Group     BP 09/24/15 1507 120/81 mmHg     Pulse Rate 09/24/15 1507 72     Resp 09/24/15 1507 20     Temp 09/24/15 1507 98.3 F (36.8 C)     Temp Source 09/24/15 1507 Oral     SpO2 09/24/15 1507 100 %     Weight --      Height --      Head Cir --      Peak Flow --      Pain Score 09/24/15 1451 10     Pain Loc --  Pain Edu? --      Excl. in GC? --     Constitutional: Alert and oriented. Well appearing and in moderate distress Eyes: Conjunctivae are normal. PERRL. EOMI. Head: Atraumatic. Nose: No congestion/rhinnorhea. Mouth/Throat: Mucous membranes are moist.  Oropharynx non-erythematous. Neck: No stridor. Cardiovascular: Normal rate, regular rhythm. Grossly normal heart sounds.  Good peripheral circulation. Respiratory: Normal respiratory effort.  No retractions. Lungs CTAB. Gastrointestinal: Soft and nontender. No distention. No abdominal bruits. No CVA tenderness. }Musculoskeletal: No lower extremity tenderness nor edema.  No joint effusions. Patient does have tenderness to palpation percussion right CVA area Neurologic:  Normal speech  and language. No gross focal neurologic deficits are appreciated. No gait instability. Skin:  Skin is warm, dry and intact. No rash noted. Psychiatric: Mood and affect are normal. Speech and behavior are normal.  ____________________________________________   LABS (all labs ordered are listed, but only abnormal results are displayed)  Labs Reviewed  COMPREHENSIVE METABOLIC PANEL - Abnormal; Notable for the following:    Chloride 114 (*)    CO2 21 (*)    BUN <5 (*)    Creatinine, Ser 0.40 (*)    Calcium 8.1 (*)    Total Protein 6.2 (*)    Albumin 3.2 (*)    ALT 12 (*)    Anion gap 1 (*)    All other components within normal limits  CBC WITH DIFFERENTIAL/PLATELET - Abnormal; Notable for the following:    Platelets 125 (*)    Neutro Abs 8.5 (*)    All other components within normal limits  URINALYSIS COMPLETEWITH MICROSCOPIC ([facility] ONLY) - Abnormal; Notable for the following:    Color, Urine STRAW (*)    APPearance CLEAR (*)    Bacteria, UA RARE (*)    Squamous Epithelial / LPF 0-5 (*)    All other components within normal limits  URINE CULTURE   ____________________________________________  EKG   ____________________________________________  RADIOLOGY  Ultrasound shows some right-sided hydronephrosis otherwise nothing ____________________________________________   PROCEDURES   ____________________________________________   INITIAL IMPRESSION / ASSESSMENT AND PLAN / ED COURSE  Pertinent labs & imaging results that were available during my care of the patient were reviewed by me and considered in my medical decision making (see chart for details). Dr. Valentino Saxon comes to see the patient will try some by mouth Ativan if that doesn't work plan to admit for pain control and also Dr. 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 this might explain some of the  pain. __________________________________________   FINAL CLINICAL IMPRESSION(S) / ED DIAGNOSES  Final diagnoses:  Back pain affecting pregnancy  Hydronephrosis, unspecified hydronephrosis type      Arnaldo Natal, MD 09/24/15 2059

## 2015-09-24 NOTE — ED Notes (Signed)
Pt returns from [place] at this time. Continue to monitor.

## 2015-09-24 NOTE — ED Notes (Signed)
Introduced self to pt. Abx completed. Pain re-assessed, will make MD aware.

## 2015-09-24 NOTE — ED Notes (Signed)
Pt reports 9/10 low back pain, pt seen here yesterday for same, pt is [redacted] weeks pregnant with her third pregnancy. Denies any n/v/d at this time

## 2015-09-24 NOTE — ED Notes (Signed)
[place] to come and get pt for renal US.

## 2015-09-24 NOTE — Telephone Encounter (Signed)
Unable to get pt on her cell. Called home number and grandmother states she was on her way back to the hospital. She is vomiting and having severe back pain. Percocet per GM not helping. Sent [facility] text so she is aware.

## 2015-09-24 NOTE — ED Notes (Signed)
edp in to update pt. [place] negative and all labs normal. Will consult with OB.

## 2015-09-24 NOTE — ED Notes (Signed)
Report given to Luis, RN.  

## 2015-09-25 ENCOUNTER — Other Ambulatory Visit: Payer: Managed Care, Other (non HMO)

## 2015-09-25 ENCOUNTER — Encounter: Payer: Self-pay | Admitting: Obstetrics and Gynecology

## 2015-09-25 DIAGNOSIS — O26899 Other specified pregnancy related conditions, unspecified trimester: Secondary | ICD-10-CM | POA: Diagnosis not present

## 2015-09-25 MED ORDER — HYDROMORPHONE HCL 4 MG PO TABS: 4.0000 mg | ORAL_TABLET | ORAL | Status: DC

## 2015-09-25 MED ORDER — HYDROMORPHONE HCL 2 MG PO TABS
4.0000 mg | ORAL_TABLET | ORAL | Status: DC
  Administered 2015-09-25 (×2): 4 mg via ORAL
  Filled 2015-09-25 (×2): qty 2

## 2015-09-25 MED ORDER — NITROFURANTOIN MONOHYD MACRO 100 MG PO CAPS: 100.0000 mg | ORAL_CAPSULE | Freq: Two times a day (BID) | ORAL | Status: DC

## 2015-09-25 MED ORDER — DOCUSATE SODIUM 100 MG PO CAPS: 100.0000 mg | ORAL_CAPSULE | Freq: Two times a day (BID) | ORAL | Status: DC | PRN

## 2015-09-25 NOTE — Discharge Instructions (Signed)

## 2015-09-25 NOTE — Progress Notes (Signed)
Antenatal Progress Note  Subjective:     Patient ID: Padme Louanne Belton is a 24 y.o. female [redacted]w[redacted]d, Estimated Date of Delivery: 02/11/16, who was admitted for pain control for low back pain in pregnancy, suspicious for pyelonephritis.  HD# 2.   Subjective:  Patient reports that pain has improved overnight.  Did not require much IV pain medication.    Review of Systems Denies contractions, leakage of fluids, vaginal bleeding, and reports good fetal movement.     Objective:   Filed Vitals:   09/25/15 0042 09/25/15 0049 09/25/15 0408 09/25/15 0733  BP: 98/52 135/71 90/44 81/40   Pulse: 59 88 52 59  Temp: 98.5 F (36.9 C) 98.3 F (36.8 C) 98.4 F (36.9 C) 98.4 F (36.9 C)  TempSrc: Oral Oral Oral Oral  Resp: Height:      Weight:      SpO2:  100%  100%    General appearance: fatigued and no distress Lungs: clear to auscultation bilaterally Heart: regular rate and rhythm, S1, S2 normal, no murmur, click, rub or gallop Abdomen: soft, non-tender; bowel sounds normal; no masses,  no organomegaly. Back: + CVA tenderness, R>L.  However decreased since yesterday.  Pelvic: deferred Extremities: extremities normal, atraumatic, no cyanosis or edema   FHT: present.   Lab:  UCx pending  Assessment:  24 y.o. female [redacted]w[redacted]d, Estimated Date of Delivery: 02/11/16 with:    1. Back pain affecting pregnancy   2. Hydronephrosis, unspecified hydronephrosis type      Plan:   1. Pain improved overnight.  Will continue with PO Dilaudid. 2. S/p dose of IV Rocephin yesterday evening.  Initiated on Macrobid for suspected UTI. Urine culture pending.  3. Continue Phenergan for nausea/vomiting.  Has not had an episode since admission. Will try on regular diet.  4. If pain continues to be controlled without use of IV pain meds, can d/c home later today.    Hildred Laser, MD [facility]

## 2015-09-25 NOTE — Progress Notes (Signed)
All discharge instructions given to patient and she voices understanding of all instructions given. She will make her own f/u appt for 1 wk from now.  Prescriptions given. Note given for work . Patient discharge home with spouse.  She didn't want to wait on wheelchair and she walked out

## 2015-09-25 NOTE — Discharge Summary (Signed)
Physician Discharge Summary  Patient ID: Vicki Cruz MRN: [id number] DOB/AGE: [birth year] 24 y.o.  Admit date: 09/24/2015 Discharge date: 09/25/2015  Admission Diagnoses: Back pain affecting pregnancy Suspected pyelonephritis  Discharge Diagnoses:  Active Problems:   Back pain affecting pregnancy in second trimester   Thrombocytopenia affecting pregnancy, antepartum (HCC) Suspected pyelonephritis  Discharged Condition: good  Hospital Course: The patient was admitted to the hospital from the Emergency Room for pain control and suspected pyelonephritis.  She was treated with a dose of IV Rocephin, and then initiated on Macrobid PO.  She was treated with anti-emetics and IV pain medications (morphine).  By HD#2 she was able to tolerate PO and was switched to PO pain meds (Dilaudid).  She was discharged on HD#2.    Consults: None  Significant Diagnostic Studies: labs: CBC, UA and microbiology: urine culture: pending  Treatments: IV hydration, antibiotics: IV ROcephin and PO Macrobid and analgesia: Dilaudid and Morphine  Discharge Exam: Blood pressure 93/55, pulse 59, temperature 99.1 F (37.3 C), temperature source Oral, resp. rate 18, height  (1.651 m), weight 148 lb (67.132 kg), SpO2 97 %. General appearance: alert and no distress Back: symmetric, no curvature. ROM normal. Positive mild CVA tenderness on right, much improved from admission. Resp: clear to auscultation bilaterally Cardio: regular rate and rhythm, S1, S2 normal, no murmur, click, rub or gallop Abdomen: soft, non-tender; bowel sounds normal; no masses,  no organomegaly and gravid.  FHT present  Pelvic: cervix normal in appearance, external genitalia normal, no adnexal masses or tenderness, no cervical motion tenderness, rectovaginal septum normal, uterus normal size, shape, and consistency and vagina normal without discharge Extremities: extremities normal, atraumatic, no cyanosis or edema  Disposition:  01-Home or Self Care     Medication List    STOP taking these medications        oxyCODONE-acetaminophen 5-325 MG tablet  Commonly known as:  ROXICET      TAKE these medications        acetaminophen 500 MG tablet  Commonly known as:  TYLENOL  Take 500-1,000 mg by mouth every 6 (six) hours as needed for mild pain or headache.     docusate sodium 100 MG capsule  Commonly known as:  COLACE  Take 1 capsule (100 mg total) by mouth 2 (two) times daily as needed for mild constipation.     HYDROmorphone 4 MG tablet  Commonly known as:  DILAUDID  Take 1 tablet (4 mg total) by mouth every 4 (four) hours.     nitrofurantoin (macrocrystal-monohydrate) 100 MG capsule  Commonly known as:  MACROBID  Take 1 capsule (100 mg total) by mouth every 12 (twelve) hours.     promethazine 25 MG tablet  Commonly known as:  PHENERGAN  Take 25 mg by mouth every 6 (six) hours as needed for nausea or vomiting.         Signed: Hildred Laser 09/25/2015, 5:55 PM

## 2015-09-26 LAB — URINE CULTURE: Special Requests: NORMAL

## 2015-09-28 ENCOUNTER — Ambulatory Visit: Payer: Managed Care, Other (non HMO)

## 2015-09-28 DIAGNOSIS — Z3492 Encounter for supervision of normal pregnancy, unspecified, second trimester: Secondary | ICD-10-CM | POA: Diagnosis not present

## 2015-10-02 ENCOUNTER — Ambulatory Visit (INDEPENDENT_AMBULATORY_CARE_PROVIDER_SITE_OTHER): Payer: Managed Care, Other (non HMO) | Admitting: Obstetrics and Gynecology

## 2015-10-02 VITALS — BP 99/71 | HR 116 | Wt 149.2 lb

## 2015-10-02 DIAGNOSIS — Z3492 Encounter for supervision of normal pregnancy, unspecified, second trimester: Secondary | ICD-10-CM

## 2015-10-02 LAB — POCT URINALYSIS DIP (MANUAL ENTRY)
BILIRUBIN UA: NEGATIVE
BILIRUBIN UA: NEGATIVE
Blood, UA: NEGATIVE
GLUCOSE UA: NEGATIVE
Leukocytes, UA: NEGATIVE
Nitrite, UA: NEGATIVE
PROTEIN UA: NEGATIVE
SPEC GRAV UA: 1.01
Urobilinogen, UA: 0.2
pH, UA: 7.5

## 2015-10-02 MED ORDER — CYCLOBENZAPRINE HCL 10 MG PO TABS: 10.0000 mg | ORAL_TABLET | Freq: Three times a day (TID) | ORAL | Status: DC | PRN

## 2015-10-02 NOTE — Progress Notes (Signed)
ROB: Patient hospitalized last week for symptoms suspicious for pyelonephritis.  Treated with IV and PO antibiotics and pain medication.  Culture is contaminated, cannot identify particular microbe.  Patient notes that the back pain has subsided a little, but still notes pain.  Takes Tylenol ES as needed, and uses Dilaudid only with severe pain (has only taken 2 or 3 tablets since admission).  Will prescribe Flexeril as it now sounds more musculoskeletal in nature.  Also recommend chiropractor/pregnancy massage for back. Patient notes that she is supposed to resume working next week but concerned about job duties (lifting, climbing ladders, Catering manageretc.).  If pain still ongoing by end of the week with Flexeril, will write letter with new job modifications.  Normal anatomy scan noted, given results to patient.  RTC in 4 weeks.

## 2015-10-03 ENCOUNTER — Telehealth: Payer: Self-pay | Admitting: Obstetrics and Gynecology

## 2015-10-03 ENCOUNTER — Encounter: Payer: Self-pay | Admitting: Obstetrics and Gynecology

## 2015-10-03 NOTE — Telephone Encounter (Signed)
Ok.  Will make generate letter for job.

## 2015-10-03 NOTE — Telephone Encounter (Signed)
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 SHE IS TRYING TO GO BACK TO WORK TOMORROW, SHE STATED SHE CAN COME BY AND PICK IT UP WHEN YOU FINISH WITH IT.

## 2015-10-08 ENCOUNTER — Encounter: Payer: Self-pay | Admitting: Obstetrics and Gynecology

## 2015-10-08 ENCOUNTER — Telehealth: Payer: Self-pay | Admitting: Obstetrics and Gynecology

## 2015-10-08 NOTE — Telephone Encounter (Signed)
Patient called requesting a call back from you. Her employer will not allow her to take her muscle relaxers prescribed by you. She has been working 9 hours days in pain and is [redacted] weeks pregnant. She didn't know if you could do anything. She can be reached at [phone].Thanks

## 2015-10-08 NOTE — Telephone Encounter (Signed)
Discussed with patient that she could take her pain medicatons before and after work, and use Tylenol products during work.  Also encouraged a change of job duties better suited for pregnancy, if possible.  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  Recommend stool for sitting if having to stand for longer than 2-3 hours at at a time, or more frequent sitting breaks if a stool cannot be provided.  Patient requests letter. Will provide .

## 2015-10-19 ENCOUNTER — Observation Stay
Admission: EM | Admit: 2015-10-19 | Discharge: 2015-10-19 | Disposition: A | Discharge status: Home / Self Care | Payer: Managed Care, Other (non HMO) | Attending: Obstetrics and Gynecology | Admitting: Obstetrics and Gynecology

## 2015-10-19 DIAGNOSIS — O368120 Decreased fetal movements, second trimester, not applicable or unspecified: Principal | ICD-10-CM | POA: Insufficient documentation

## 2015-10-19 DIAGNOSIS — Z3A24 24 weeks gestation of pregnancy: Secondary | ICD-10-CM | POA: Insufficient documentation

## 2015-10-19 DIAGNOSIS — R5381 Other malaise: Secondary | ICD-10-CM | POA: Diagnosis present

## 2015-10-19 LAB — URINALYSIS COMPLETE WITH MICROSCOPIC (ARMC ONLY)
BACTERIA UA: NONE SEEN
Bilirubin Urine: NEGATIVE
Glucose, UA: NEGATIVE mg/dL
Hgb urine dipstick: NEGATIVE
Ketones, ur: NEGATIVE mg/dL
Leukocytes, UA: NEGATIVE
NITRITE: NEGATIVE
PROTEIN: NEGATIVE mg/dL
RBC / HPF: NONE SEEN RBC/hpf (ref 0–5)
SPECIFIC GRAVITY, URINE: 1.01 (ref 1.005–1.030)
pH: 7 (ref 5.0–8.0)

## 2015-10-19 LAB — INFLUENZA PANEL BY PCR (TYPE A & B)
H1N1 flu by pcr: NOT DETECTED
INFLAPCR: NEGATIVE
Influenza B By PCR: NEGATIVE

## 2015-10-19 MED ORDER — ONDANSETRON 4 MG PO TBDP
4.0000 mg | ORAL_TABLET | Freq: Four times a day (QID) | ORAL | Status: DC | PRN
  Administered 2015-10-19: 4 mg via ORAL
  Filled 2015-10-19: qty 1

## 2015-10-19 NOTE — Progress Notes (Signed)
Pt states that zofran medicine has helped with nausea. Pt is ready to be DC to home

## 2015-10-19 NOTE — OB Triage Note (Signed)
Pt presents to [facility]place with c/o decreased fetal movement and flu like symptoms. Pt c/o congestion, body aches, chills, nasuea and  low grade fever (highest 99.4 at noon today.) Pt reports fetal movement while in birthplace and some fetal movement about 30 mins before that. Last time pt has eaten was 1100 today, she tried to eat small amount of chicken soup and "couldn't eat much of it."

## 2015-10-22 ENCOUNTER — Encounter: Payer: Self-pay | Admitting: Emergency Medicine

## 2015-10-22 ENCOUNTER — Ambulatory Visit
Admission: EM | Admit: 2015-10-22 | Discharge: 2015-10-22 | Disposition: A | Discharge status: Home / Self Care | Payer: Commercial Indemnity | Attending: Family Medicine | Admitting: Family Medicine

## 2015-10-22 ENCOUNTER — Telehealth: Payer: Self-pay | Admitting: Obstetrics and Gynecology

## 2015-10-22 DIAGNOSIS — Z331 Pregnant state, incidental: Secondary | ICD-10-CM | POA: Diagnosis not present

## 2015-10-22 DIAGNOSIS — D696 Thrombocytopenia, unspecified: Secondary | ICD-10-CM

## 2015-10-22 DIAGNOSIS — H6593 Unspecified nonsuppurative otitis media, bilateral: Secondary | ICD-10-CM

## 2015-10-22 DIAGNOSIS — A084 Viral intestinal infection, unspecified: Secondary | ICD-10-CM

## 2015-10-22 DIAGNOSIS — Z3490 Encounter for supervision of normal pregnancy, unspecified, unspecified trimester: Secondary | ICD-10-CM

## 2015-10-22 DIAGNOSIS — O99119 Other diseases of the blood and blood-forming organs and certain disorders involving the immune mechanism complicating pregnancy, unspecified trimester: Principal | ICD-10-CM

## 2015-10-22 DIAGNOSIS — J069 Acute upper respiratory infection, unspecified: Secondary | ICD-10-CM | POA: Diagnosis not present

## 2015-10-22 LAB — RAPID STREP SCREEN (MED CTR MEBANE ONLY): STREPTOCOCCUS, GROUP A SCREEN (DIRECT): NEGATIVE

## 2015-10-22 MED ORDER — FLUTICASONE PROPIONATE 50 MCG/ACT NA SUSP: 1.0000 | Freq: Two times a day (BID) | NASAL | Status: DC

## 2015-10-22 MED ORDER — SODIUM CHLORIDE 0.9 % IV SOLN
INTRAVENOUS | Status: DC
  Administered 2015-10-22: 16:00:00 via INTRAVENOUS

## 2015-10-22 MED ORDER — ONDANSETRON 4 MG PO TBDP: 4.0000 mg | ORAL_TABLET | Freq: Two times a day (BID) | ORAL | Status: AC

## 2015-10-22 MED ORDER — LORATADINE 10 MG PO TABS: 10.0000 mg | ORAL_TABLET | Freq: Every day | ORAL | Status: DC

## 2015-10-22 MED ORDER — SALINE SPRAY 0.65 % NA SOLN: 2.0000 | NASAL | Status: DC

## 2015-10-22 MED ORDER — ONDANSETRON 8 MG PO TBDP
8.0000 mg | ORAL_TABLET | Freq: Once | ORAL | Status: AC
  Administered 2015-10-22: 8 mg via ORAL

## 2015-10-22 NOTE — Discharge Instructions (Signed)
Otitis Media With Effusion Otitis media with effusion is the presence of fluid in the middle ear. This is a common problem in children, which often follows ear infections. It may be present for weeks or longer after the infection. Unlike an acute ear infection, otitis media with effusion refers only to fluid behind the ear drum and not infection. Children with repeated ear and sinus infections and allergy problems are the most likely to get otitis media with effusion. CAUSES  The most frequent cause of the fluid buildup is dysfunction of the eustachian tubes. These are the tubes that drain fluid in the ears to the back of the nose (nasopharynx). SYMPTOMS   The main symptom of this condition is hearing loss. As a result, you or your child may:  Listen to the TV at a loud volume.  Not respond to questions.  Ask "what" often when spoken to.  Mistake or confuse one sound or word for another.  There may be a sensation of fullness or pressure but usually not pain. DIAGNOSIS   Your health care provider will diagnose this condition by examining you or your child's ears.  Your health care provider may test the pressure in you or your child's ear with a tympanometer.  A hearing test may be conducted if the problem persists. TREATMENT   Treatment depends on the duration and the effects of the effusion.  Antibiotics, decongestants, nose drops, and cortisone-type drugs (tablets or nasal spray) may not be helpful.  Children with persistent ear effusions may have delayed language or behavioral problems. Children at risk for developmental delays in hearing, learning, and speech may require referral to a specialist earlier than children not at risk.  You or your child's health care provider may suggest a referral to an ear, nose, and throat surgeon for treatment. The following may help restore normal hearing:  Drainage of fluid.  Placement of ear tubes (tympanostomy tubes).  Removal of adenoids  (adenoidectomy). HOME CARE INSTRUCTIONS   Avoid secondhand smoke.  Infants who are breastfed are less likely to have this condition.  Avoid feeding infants while they are lying flat.  Avoid known environmental allergens.  Avoid people who are sick. SEEK MEDICAL CARE IF:   Hearing is not better in 3 months.  Hearing is worse.  Ear pain.  Drainage from the ear.  Dizziness. MAKE SURE YOU:   Understand these instructions.  Will watch your condition.  Will get help right away if you are not doing well or get worse.   This information is not intended to replace advice given to you by your health care provider. Make sure you discuss any questions you have with your health care provider.   Document Released: 01/08/2005 Document Revised: 12/22/2014 Document Reviewed: 06/28/2013 Elsevier Interactive Patient Education 2016 [employer] Upper Respiratory Infection, Adult Most upper respiratory infections (URIs) are a viral infection of the air passages leading to the lungs. A URI affects the nose, throat, and upper air passages. The most common type of URI is nasopharyngitis and is typically referred to as "the common cold." URIs run their course and usually go away on their own. Most of the time, a URI does not require medical attention, but sometimes a bacterial infection in the upper airways can follow a viral infection. This is called a secondary infection. Sinus and middle ear infections are common types of secondary upper respiratory infections. Bacterial pneumonia can also complicate a URI. A URI can worsen asthma and chronic obstructive pulmonary disease (  COPD). Sometimes, these complications can require emergency medical care and may be life threatening.  CAUSES Almost all URIs are caused by viruses. A virus is a type of germ and can spread from one person to another.  RISKS FACTORS You may be at risk for a URI if:   You smoke.   You have chronic heart or lung  disease.  You have a weakened defense (immune) system.   You are very young or very old.   You have nasal allergies or asthma.  You work in crowded or poorly ventilated areas.  You work in health care facilities or schools. SIGNS AND SYMPTOMS  Symptoms typically develop 2-3 days after you come in contact with a cold virus. Most viral URIs last 7-10 days. However, viral URIs from the influenza virus (flu virus) can last 14-18 days and are typically more severe. Symptoms may include:   Runny or stuffy (congested) nose.   Sneezing.   Cough.   Sore throat.   Headache.   Fatigue.   Fever.   Loss of appetite.   Pain in your forehead, behind your eyes, and over your cheekbones (sinus pain).  Muscle aches.  DIAGNOSIS  Your health care provider may diagnose a URI by:  Physical exam.  Tests to check that your symptoms are not due to another condition such as:  Strep throat.  Sinusitis.  Pneumonia.  Asthma. TREATMENT  A URI goes away on its own with time. It cannot be cured with medicines, but medicines may be prescribed or recommended to relieve symptoms. Medicines may help:  Reduce your fever.  Reduce your cough.  Relieve nasal congestion. HOME CARE INSTRUCTIONS   Take medicines only as directed by your health care provider.   Gargle warm saltwater or take cough drops to comfort your throat as directed by your health care provider.  Use a warm mist humidifier or inhale steam from a shower to increase air moisture. This may make it easier to breathe.  Drink enough fluid to keep your urine clear or pale yellow.   Eat soups and other clear broths and maintain good nutrition.   Rest as needed.   Return to work when your temperature has returned to normal or as your health care provider advises. You may need to stay home longer to avoid infecting others. You can also use a face mask and careful hand washing to prevent spread of the  virus.  Increase the usage of your inhaler if you have asthma.   Do not use any tobacco products, including cigarettes, chewing tobacco, or electronic cigarettes. If you need help quitting, ask your health care provider. PREVENTION  The best way to protect yourself from getting a cold is to practice good hygiene.   Avoid oral or hand contact with people with cold symptoms.   Wash your hands often if contact occurs.  There is no clear evidence that vitamin C, vitamin E, echinacea, or exercise reduces the chance of developing a cold. However, it is always recommended to get plenty of rest, exercise, and practice good nutrition.  SEEK MEDICAL CARE IF:   You are getting worse rather than better.   Your symptoms are not controlled by medicine.   You have chills.  You have worsening shortness of breath.  You have brown or red mucus.  You have yellow or brown nasal discharge.  You have pain in your face, especially when you bend forward.  You have a fever.  You have swollen neck glands.  You have pain while swallowing.  You have white areas in the back of your throat. SEEK IMMEDIATE MEDICAL CARE IF:   You have severe or persistent:  Headache.  Ear pain.  Sinus pain.  Chest pain.  You have chronic lung disease and any of the following:  Wheezing.  Prolonged cough.  Coughing up blood.  A change in your usual mucus.  You have a stiff neck.  You have changes in your:  Vision.  Hearing.  Thinking.  Mood. MAKE SURE YOU:   Understand these instructions.  Will watch your condition.  Will get help right away if you are not doing well or get worse.   This information is not intended to replace advice given to you by your health care provider. Make sure you discuss any questions you have with your health care provider.   Document Released: 05/27/2001 Document Revised: 04/17/2015 Document Reviewed: 03/08/2014 Elsevier Interactive Patient Education  2016 [employer] Viral Gastroenteritis Viral gastroenteritis is also known as stomach flu. This condition affects the stomach and intestinal tract. It can cause sudden diarrhea and vomiting. The illness typically lasts 3 to 8 days. Most people develop an immune response that eventually gets rid of the virus. While this natural response develops, the virus can make you quite ill. CAUSES  Many different viruses can cause gastroenteritis, such as rotavirus or noroviruses. You can catch one of these viruses by consuming contaminated food or water. You may also catch a virus by sharing utensils or other personal items with an infected person or by touching a contaminated surface. SYMPTOMS  The most common symptoms are diarrhea and vomiting. These problems can cause a severe loss of body fluids (dehydration) and a body salt (electrolyte) imbalance. Other symptoms may include:  Fever.  Headache.  Fatigue.  Abdominal pain. DIAGNOSIS  Your caregiver can usually diagnose viral gastroenteritis based on your symptoms and a physical exam. A stool sample may also be taken to test for the presence of viruses or other infections. TREATMENT  This illness typically goes away on its own. Treatments are aimed at rehydration. The most serious cases of viral gastroenteritis involve vomiting so severely that you are not able to keep fluids down. In these cases, fluids must be given through an intravenous line (IV). HOME CARE INSTRUCTIONS   Drink enough fluids to keep your urine clear or pale yellow. Drink small amounts of fluids frequently and increase the amounts as tolerated.  Ask your caregiver for specific rehydration instructions.  Avoid:  Foods high in sugar.  Alcohol.  Carbonated drinks.  Tobacco.  Juice.  Caffeine drinks.  Extremely hot or cold fluids.  Fatty, greasy foods.  Too much intake of anything at one time.  Dairy products until 24 to 48 hours after diarrhea stops.  You may  consume probiotics. Probiotics are active cultures of beneficial bacteria. They may lessen the amount and number of diarrheal stools in adults. Probiotics can be found in yogurt with active cultures and in supplements.  Wash your hands well to avoid spreading the virus.  Only take over-the-counter or prescription medicines for pain, discomfort, or fever as directed by your caregiver. Do not give aspirin to children. Antidiarrheal medicines are not recommended.  Ask your caregiver if you should continue to take your regular prescribed and over-the-counter medicines.  Keep all follow-up appointments as directed by your caregiver. SEEK IMMEDIATE MEDICAL CARE IF:   You are unable to keep fluids down.  You do not urinate at least once  every 6 to 8 hours.  You develop shortness of breath.  You notice blood in your stool or vomit. This may look like coffee grounds.  You have abdominal pain that increases or is concentrated in one small area (localized).  You have persistent vomiting or diarrhea.  You have a fever.  The patient is a child younger than 3 months, and he or she has a fever.  The patient is a child older than 3 months, and he or she has a fever and persistent symptoms.  The patient is a child older than 3 months, and he or she has a fever and symptoms suddenly get worse.  The patient is a baby, and he or she has no tears when crying. MAKE SURE YOU:   Understand these instructions.  Will watch your condition.  Will get help right away if you are not doing well or get worse.   This information is not intended to replace advice given to you by your health care provider. Make sure you discuss any questions you have with your health care provider.   Document Released: 12/01/2005 Document Revised: 02/23/2012 Document Reviewed: 09/17/2011 Elsevier Interactive Patient Education [employer]

## 2015-10-22 NOTE — Telephone Encounter (Signed)
Pt called and she went to the ER this past weekend she thought she had the Flu but she tested negative for that, she wanted a call back on what she needs to do for a really bad cold, she didn't know if she needed to come in or if she needed to try more OTC or have something called in for her. she is pregnant in her 2nd trimester.

## 2015-10-22 NOTE — ED Provider Notes (Signed)
CSN: [id number]     Arrival date & time 10/22/15  1433 History   First MD Initiated Contact with Patient 10/22/15 1527     Chief Complaint  Patient presents with  . Emesis   (Consider location/radiation/quality/duration/timing/severity/associated sxs/prior Treatment) HPI Comments: [redacted] week pregnant Married caucasian female here for evaluation of vomiting x 5 today at work last episode 20 minutes ago left work to come here; cough, runny nose, nonproductive cough, dizzy was seen by OB last week tested for flu negative.  Given zofran at OB last week but no Rx at home helped vomiting.  Has not taken any medications today has been having back pain intermittent with standing at work was given dilaudid, phenergan, flexeril, tylenol by Hildred Laser MD patient taking as needed.  Denied history seasonal allergies  Works at [employer] still moving normally  The history is provided by the patient and the spouse.    Past Medical History  Diagnosis Date  . Tobacco abuse     quit: 06/06/2015  . History of miscarriage    Past Surgical History  Procedure Laterality Date  . Cesarean section  2008   Family History  Problem Relation Age of Onset  . Cancer Paternal Grandfather     unsure of what type   Social History  Substance Use Topics  . Smoking status: Former Smoker    Quit date: 05/16/2015  . Smokeless tobacco: Never Used  . Alcohol Use: No   OB History    Gravida Para Term Preterm AB TAB SAB Ectopic Multiple Living   3 1 1  1  1   1       Obstetric Comments   Failed IOL at 42 weeks     Review of Systems  Constitutional: Positive for fatigue. Negative for fever, chills, diaphoresis, activity change, appetite change and unexpected weight change.  HENT: Positive for congestion, postnasal drip, rhinorrhea, sinus pressure and sore throat. Negative for dental problem, drooling, ear discharge, ear pain, facial swelling, hearing loss, mouth sores, nosebleeds, sneezing,  tinnitus, trouble swallowing and voice change.   Eyes: Negative for photophobia, pain, discharge, redness, itching and visual disturbance.  Respiratory: Positive for cough. Negative for choking, chest tightness, shortness of breath, wheezing and stridor.   Cardiovascular: Negative for chest pain, palpitations and leg swelling.  Gastrointestinal: Positive for nausea and vomiting. Negative for abdominal pain, diarrhea, constipation, blood in stool and abdominal distention.  Endocrine: Negative for cold intolerance and heat intolerance.  Genitourinary: Negative for dysuria, frequency, hematuria and difficulty urinating.  Musculoskeletal: Negative for myalgias, back pain, joint swelling, arthralgias, gait problem, neck pain and neck stiffness.  Skin: Negative for color change, pallor, rash and wound.  Allergic/Immunologic: Negative for environmental allergies and food allergies.  Neurological: Positive for dizziness and light-headedness. Negative for tremors, seizures, syncope, facial asymmetry, speech difficulty, weakness, numbness and headaches.  Hematological: Negative for adenopathy. Does not bruise/bleed easily.  Psychiatric/Behavioral: Positive for sleep disturbance. Negative for behavioral problems, confusion and agitation. The patient is not nervous/anxious.     Allergies  Penicillins  Home Medications   Prior to Admission medications   Medication Sig Start Date End Date Taking? Authorizing Provider  acetaminophen (TYLENOL) 500 MG tablet Take 500-1,000 mg by mouth every 6 (six) hours as needed for mild pain or headache.     Historical Provider, MD  cyclobenzaprine (FLEXERIL) 10 MG tablet Take 1 tablet (10 mg total) by mouth every 8 (eight) hours as needed for muscle spasms. 10/02/15  Hildred LaserAnika Cherry, MD  docusate sodium (COLACE) 100 MG capsule Take 1 capsule (100 mg total) by mouth 2 (two) times daily as needed for mild constipation. Patient not taking: Reported on 10/19/2015 09/25/15    Hildred LaserAnika Cherry, MD  fluticasone [facility](FLONASE) 50 MCG/ACT nasal spray Place 1 spray into both nostrils 2 (two) times daily. 10/22/15   Barbaraann Barthelina A Leylani Duley, NP  HYDROmorphone (DILAUDID) 4 MG tablet Take 1 tablet (4 mg total) by mouth every 4 (four) hours. Patient not taking: Reported on 10/19/2015 09/25/15   Hildred LaserAnika Cherry, MD  loratadine (CLARITIN) 10 MG tablet Take 1 tablet (10 mg total) by mouth daily. 10/22/15   Barbaraann Barthelina A Sharleen Szczesny, NP  ondansetron (ZOFRAN-ODT) 4 MG disintegrating tablet Take 1 tablet (4 mg total) by mouth 2 (two) times daily. 10/22/15 10/29/15  Barbaraann Barthelina A Naji Mehringer, NP  promethazine (PHENERGAN) 25 MG tablet Take 25 mg by mouth every 6 (six) hours as needed for nausea or vomiting.    Historical Provider, MD  sodium chloride (OCEAN) 0.65 % SOLN nasal spray Place 2 sprays into both nostrils every 2 (two) hours while awake. 10/22/15   Barbaraann Barthelina A Cathe Bilger, NP   Meds Ordered and Administered this Visit   Medications  0.9 %  sodium chloride infusion ( Intravenous New Bag/Given 10/22/15 1620)  ondansetron (ZOFRAN-ODT) disintegrating tablet 8 mg (8 mg Oral Given 10/22/15 1541)    BP 108/63 mmHg  Pulse 90  Temp(Src) 97.3 F (36.3 C) (Tympanic)  Resp 16  Ht 5\' 6"  (1.676 m)  Wt 149 lb (67.586 kg)  BMI 24.06 kg/m2  SpO2 100%  LMP  (LMP Unknown) Orthostatic VS for the past 24 hrs:  BP- Lying Pulse- Lying BP- Sitting Pulse- Sitting BP- Standing at 0 minutes Pulse- Standing at 0 minutes  10/22/15 1543 105/71 mmHg 86 108/74 mmHg 89 97/60 mmHg 93  10/22/15 1541 106/64 mmHg 84 109/62 mmHg 86 - -    Physical Exam  Constitutional: She is oriented to person, place, and time. She appears well-developed and well-nourished. She is active and cooperative.  Non-toxic appearance. She does not have a sickly appearance. She appears ill. No distress.  HENT:  Head: Normocephalic and atraumatic.  Right Ear: Hearing, external ear and ear canal normal. A middle ear effusion is present.  Left Ear: Hearing, external ear  and ear canal normal. A middle ear effusion is present.  Nose: Mucosal edema and rhinorrhea present. No nose lacerations, sinus tenderness, nasal deformity, septal deviation or nasal septal hematoma. No epistaxis.  No foreign bodies. Right sinus exhibits no maxillary sinus tenderness and no frontal sinus tenderness. Left sinus exhibits no maxillary sinus tenderness and no frontal sinus tenderness.  Mouth/Throat: Uvula is midline and mucous membranes are normal. Mucous membranes are not pale, not dry and not cyanotic. She does not have dentures. No oral lesions. No trismus in the jaw. Normal dentition. No dental abscesses, uvula swelling, lacerations or dental caries. Posterior oropharyngeal edema and posterior oropharyngeal erythema present. No oropharyngeal exudate or tonsillar abscesses.  Bilateral TMs with air fluid level; cobblestoning posterior pharynx; tonsils edema/erythema 2+/4 bilaterally; bilateral nasal turbinates with edema/erythema/clear discharge  Eyes: Conjunctivae, EOM and lids are normal. Pupils are equal, round, and reactive to light. Right eye exhibits no chemosis, no discharge, no exudate and no hordeolum. No foreign body present in the right eye. Left eye exhibits no chemosis, no discharge, no exudate and no hordeolum. No foreign body present in the left eye. Right conjunctiva is not injected. Right conjunctiva has no hemorrhage.  Left conjunctiva is not injected. Left conjunctiva has no hemorrhage. No scleral icterus. Right eye exhibits normal extraocular motion and no nystagmus. Left eye exhibits normal extraocular motion and no nystagmus. Right pupil is round and reactive. Left pupil is round and reactive. Pupils are equal.  Neck: Trachea normal and normal range of motion. Neck supple. No tracheal tenderness, no spinous process tenderness and no muscular tenderness present. No rigidity. No tracheal deviation, no edema, no erythema and normal range of motion present. No thyroid mass and no  thyromegaly present.  Cardiovascular: Normal rate, regular rhythm, S1 normal, S2 normal, normal heart sounds and intact distal pulses.  PMI is not displaced.  Exam reveals no gallop and no friction rub.   No murmur heard. Pulses:      Radial pulses are 2+ on the right side, and 2+ on the left side.  Pulmonary/Chest: Effort normal and breath sounds normal. No accessory muscle usage or stridor. No respiratory distress. She has no decreased breath sounds. She has no wheezes. She has no rales. She exhibits no tenderness.  Abdominal: Soft. Bowel sounds are normal. She exhibits no shifting dullness, no pulsatile liver, no abdominal bruit, no ascites, no pulsatile midline mass and no mass. There is no hepatosplenomegaly. There is generalized tenderness. There is no rigidity, no rebound, no guarding, no CVA tenderness, no tenderness at McBurney's point and negative Murphy's sign.  Dull to percussion x 4  Quads; baby moving with FHTs 160s; tender everywhere belly palpated not guarding or exquisitely tender  Musculoskeletal: Normal range of motion. She exhibits no edema or tenderness.       Right shoulder: Normal.       Left shoulder: Normal.       Right wrist: Normal.       Left wrist: Normal.       Right hip: Normal.       Left hip: Normal.       Right knee: Normal.       Left knee: Normal.       Right ankle: Normal.       Left ankle: Normal.       Cervical back: Normal.       Thoracic back: Normal.       Lumbar back: Normal.       Right hand: Normal.       Left hand: Normal.  Lymphadenopathy:       Head (right side): No submental, no submandibular, no tonsillar, no preauricular, no posterior auricular and no occipital adenopathy present.       Head (left side): No submental, no submandibular, no tonsillar, no preauricular, no posterior auricular and no occipital adenopathy present.    She has no cervical adenopathy.       Right cervical: No superficial cervical, no deep cervical and no posterior  cervical adenopathy present.      Left cervical: No superficial cervical, no deep cervical and no posterior cervical adenopathy present.  Neurological: She is alert and oriented to person, place, and time. She has normal strength. She is not disoriented. She displays no atrophy and no tremor. No cranial nerve deficit or sensory deficit. She exhibits normal muscle tone. She displays no seizure activity. Coordination and gait normal. GCS eye subscore is 4. GCS verbal subscore is 5. GCS motor subscore is 6.  Skin: Skin is warm, dry and intact. No abrasion, no bruising, no burn, no ecchymosis, no laceration, no lesion, no petechiae and no rash noted. She is not diaphoretic.  No cyanosis or erythema. No pallor. Nails show no clubbing.  Psychiatric: She has a normal mood and affect. Her speech is normal and behavior is normal. Judgment and thought content normal. Cognition and memory are normal.  Nursing note and vitals reviewed.   ED Course  Procedures (including critical care time) Filed Vitals:   10/22/15 1753  BP: 108/63  Pulse: 90  Temp: 97.3 F (36.3 C)  Resp: 16    Labs Review Labs Reviewed  RAPID STREP SCREEN (NOT AT [facility])  CULTURE, GROUP A STREP ([facility] ONLY)    Imaging Review No results found.  1630 rapid strep negative.  Patient notified.  Throat culture pending 48 hours will call once results available.  zofran has helped still dizzy with position changes no further emesis 1L IV NS bolus.  FHT 160s spouse in room now Patient verbalized understanding of information/instructions, agreed with plan of care and had no further questions at this time.  1800 ambulated to bathroom after liter saline fluid bolus feeling better denied dizzyness urinated.  Discharge to home VSS  Clear liquid diet tonight ginger ale, broth, water recommended.  May repeat zofran at 2330 if needed due to worsening nausea or vomiting again.  Hold all po intake 1 hour if vomiting resumes than sips q30 minutes.  May  advance to bland soft diet in am if nausea/vomiting resolved.  If vomiting again hold all po intake x 1 hour, clear liquids x 24 hours than advance to full liquids/bland diet.  Patient verbalized understanding of information/instructions, agreed with plan of care and had no further questions at this time.  MDM   1. Viral gastroenteritis   2. URI, acute   3. Otitis media with effusion, bilateral   4. Pregnancy    Work/school note given to patient for return in 48 hours  Rx zofran 4mg  ODT po BID prn nausea/vomiting given.  I have recommended clear fluids and the BRATT diet.  Medications as directed.   Return to the clinic if symptoms persist or worsen; I have alerted the patient to call if high fever, dehydration, marked weakness, fainting, increased abdominal pain, blood in stool or vomit (red or black).   Exitcare handout on gastroenteritis given to patient. Patient verbalized agreement and understanding of treatment plan.   P2:  Hand washing and fitness  Discussed pregnancy category/risks for each Rx.  Nasal saline 2 sprays each nostril q2h prn while awake congestion.  Consider flonase 1 spray each nostril BID or claritin 10mg  po daily for rhinitis/sinusitis. Shower steam twice a day to help with nasal congestion.  May use humidifier in bedroom.  Viral upper respiratory infection: no evidence of invasive bacterial infection, non toxic and well hydrated.  This is most likely self limiting viral infection.  I do not see where any further testing or imaging is necessary at this time.   I will suggest supportive care, rest, good hygiene and encourage the patient to take adequate fluids.  The patient is to return to clinic or EMERGENCY ROOM if symptoms worsen or change significantly e.g. fever, lethargy, SOB, wheezing.  Exitcare handout on common cold given to patient.  Patient verbalized agreement and understanding of treatment plan.   P2:  Hand washing and cover cough  Supportive treatment.   No  evidence of invasive bacterial infection, non toxic and well hydrated.  This is most likely self limiting viral infection.  I do not see where any further testing or imaging is necessary at this time.  I will suggest supportive care, rest, good hygiene and encourage the patient to take adequate fluids.  The patient is to return to clinic or EMERGENCY ROOM if symptoms worsen or change significantly e.g. ear pain, fever, purulent discharge from ears or bleeding.  Exitcare handout on otitis media with effusion given to patient.  Patient verbalized agreement and understanding of treatment plan.    Pregnancy keep regular OB appts.  Monitor baby movements and follow up if not moving regularly.   Keep hydrated.  Avoid any medication intake without first discussing with PCM/OB.  Discussed pregnancy classifications of medications A-D,X.  Discussed to follow up immediately with provider if vaginal bleeding, fever >100.5 or abdomen pain worsening, hematemesis.   Ginger ale.  discussed if vomiting hold all po until stopped then restart fluids and if tolerate add bland solids avoid dairy, fried, spicy or meat until tolerating bland diet.  Patient verbalized understanding of information/instructions, agreed with plan of care and had no further questions at this time.   Barbaraann Barthel, NP 10/22/15 1919  26 Oct 2015 at 0849 Telephone message left for patient throat culture normal/negative to contact clinic if further questions or concerns.  Barbaraann Barthel, NP 10/26/15 2049

## 2015-10-22 NOTE — Telephone Encounter (Signed)
Has been sick x4 days. No PCP. Temp: 99.4 highest, coughing-non-productive, +nasal congestion, nasal drainage, sore throat. Flu test negative in ER. Triage nurse suggested pt go to ER to have baby checked, not moving as much. Pt states everything was okay with baby. Nausea and vomiting. Was given Phenergan in the ER. No diarrhea. Pt advised to take Tylenol for pain as directed, Sudafed as directed,  cool mist humidifier, and may want to try OTC saline nose spray. If temp goes up, chest congestion as in coughing/productive cough, to contact office for an appt. Or if not better in a few days to contact office.

## 2015-10-22 NOTE — ED Notes (Signed)
Pt sick x 4 days with cough and vomiting

## 2015-10-22 NOTE — ED Notes (Signed)
1000 mL N/S completed and IV d/c'd with cannula intact and site without redness or swelling

## 2015-10-25 LAB — CULTURE, GROUP A STREP (THRC)

## 2015-10-30 ENCOUNTER — Ambulatory Visit (INDEPENDENT_AMBULATORY_CARE_PROVIDER_SITE_OTHER): Payer: Managed Care, Other (non HMO) | Admitting: Obstetrics and Gynecology

## 2015-10-30 VITALS — BP 116/67 | HR 82 | Wt 154.2 lb

## 2015-10-30 DIAGNOSIS — Z131 Encounter for screening for diabetes mellitus: Secondary | ICD-10-CM

## 2015-10-30 DIAGNOSIS — Z3492 Encounter for supervision of normal pregnancy, unspecified, second trimester: Secondary | ICD-10-CM

## 2015-10-30 LAB — POCT URINALYSIS DIPSTICK
Bilirubin, UA: NEGATIVE
Glucose, UA: NEGATIVE
Ketones, UA: NEGATIVE
LEUKOCYTES UA: NEGATIVE
NITRITE UA: NEGATIVE
PH UA: 6.5
PROTEIN UA: NEGATIVE
RBC UA: NEGATIVE
Spec Grav, UA: 1.015
UROBILINOGEN UA: NEGATIVE

## 2015-10-30 NOTE — Progress Notes (Signed)
ROB: Patient doing well, recovering from URI. Does note vaginal discharge increased, but denies odor or itching/burning. Needs glucola screen next visit. H/o thrombocytopenia, will recheck at next visit along with 28 week labs. RTC in 4 weeks.

## 2015-11-03 ENCOUNTER — Encounter: Payer: Self-pay | Admitting: Obstetrics and Gynecology

## 2015-11-03 ENCOUNTER — Observation Stay
Admission: EM | Admit: 2015-11-03 | Discharge: 2015-11-03 | Disposition: A | Discharge status: Home / Self Care | Payer: Managed Care, Other (non HMO) | Attending: Obstetrics and Gynecology | Admitting: Obstetrics and Gynecology

## 2015-11-03 DIAGNOSIS — O4690 Antepartum hemorrhage, unspecified, unspecified trimester: Secondary | ICD-10-CM | POA: Diagnosis present

## 2015-11-03 DIAGNOSIS — Z3A25 25 weeks gestation of pregnancy: Secondary | ICD-10-CM | POA: Diagnosis not present

## 2015-11-03 DIAGNOSIS — O2242 Hemorrhoids in pregnancy, second trimester: Secondary | ICD-10-CM | POA: Diagnosis not present

## 2015-11-03 DIAGNOSIS — N939 Abnormal uterine and vaginal bleeding, unspecified: Secondary | ICD-10-CM | POA: Diagnosis present

## 2015-11-03 NOTE — Final Progress Note (Signed)
L&D OB Triage Note  HPI:  Kynleigh Louanne BeltonLynn Faircloth is a 24 y.o. 683P1011 female at 3669w5d. Estimated Date of Delivery: 02/11/16 who presents for vaginal bleding x 1 episode.  Notes wiping after using the restroom (urination and bowel movement) and had moderate amount of bright red/pink blood on toilet paper and puddle in the toilet.  Denies passage of clots.  No further episodes since then.  Denies recent coitus, vaginal discharge.     ROS:  Review of Systems - Negative except what's noted in HPI   Physical Exam:  Blood pressure 112/72, pulse 82, temperature 99.2 F (37.3 C), temperature source Oral, resp. rate 16, height 5\' 6"  (1.676 m), weight 154 lb (69.854 kg). General appearance: alert and no distress Abdomen: soft, non-tender; bowel sounds normal; no masses,  no organomegaly Pelvic: cervix normal in appearance, external genitalia normal, no adnexal masses or tenderness, no cervical motion tenderness, rectovaginal septum normal, uterus normal size, shape, and consistency and vagina normal without discharge  Rectal: internal exam not performed.  External exam with normal sphincter, small hemorrhoid present. Extremities: extremities normal, atraumatic, no cyanosis or edema   FETAL SURVEILLANCE TESTING SUMMARY  INDICATIONS: patient reassurance and vaginal bleeding (?)  OBJECTIVE RESULTS: Fetal heart variability: moderate Fetal Heart Rate decelerations: none Fetal Heart Rate accelerations: yes Baseline FHR: 140 per minute Fetal Non-stress Test: reactive Amniotic Fluid Index: not indicated Uterine irritability: yes Uterine contractions: none Biophysical Profile: not performed.  Fetal surveillance: reassuring'  Assessment:  24 y.o. G3P1011 at 5969w5d with:  1. Hemorrhoids.  2. No evidence of vaginal bleeding.   Plan:  1. Advised on stool softeners, increased water and fiber intake, Preparation H.  2. Discharge home.  3. Patient desires work notice for excusing work, concerned  about job and having abscenses from work (although excused), and stress.  Desires to go to part time.  Work notice with restrictions given.    Hildred LaserAnika Fujie Dickison, MD

## 2015-11-03 NOTE — OB Triage Note (Signed)
24 y.o. female presents today with complaint of bleeding.  States that this started at 1100 today. Was on the toilet voiding and passing stool, when wiped noticed moderate amount blood bright red/pinkish in color on toilet paper and puddle of it in toilet. States is having back pain that is a 6-7/10 on pain scale, states it is not "really bad bad, but more discomfort".   Has h/o back pain, but none in past 3 months.  This pain just started back today.  Pain is intermittent and is a sharp/achy pain lower down on both sides.  She states that Tylenol makes it better and standing a long time makes it worse. At home has tried to make it better Tylenol and laying down.  Denies recent sexual activity.  Has h/o hemorrhoids with first pregnancy, but states she was not straining with stool today.  Denies dysuria, frequency, or increased vaginal discharge.  Has had some nausea today.

## 2015-11-06 ENCOUNTER — Telehealth: Payer: Self-pay | Admitting: Obstetrics and Gynecology

## 2015-11-06 NOTE — Telephone Encounter (Signed)
PT STATED THAT SHE TALKED TO YOU ON Saturday ABOUT GOING AHEAD AND GOING ON MATERNITY LEAVE, AND SHE HAS DISIDED THAT SHE WANTS TO GO AHEAD AND START HER MATERNITY LEAVE. SHE IS 26 WEEKS. SHE STATED THAT SHE WILL GET US THE FMLA PPW.

## 2015-11-27 ENCOUNTER — Ambulatory Visit (INDEPENDENT_AMBULATORY_CARE_PROVIDER_SITE_OTHER): Payer: Managed Care, Other (non HMO) | Admitting: Obstetrics and Gynecology

## 2015-11-27 VITALS — BP 104/66 | HR 118 | Wt 158.3 lb

## 2015-11-27 DIAGNOSIS — Z23 Encounter for immunization: Secondary | ICD-10-CM | POA: Diagnosis not present

## 2015-11-27 DIAGNOSIS — M549 Dorsalgia, unspecified: Secondary | ICD-10-CM

## 2015-11-27 DIAGNOSIS — O99891 Other specified diseases and conditions complicating pregnancy: Secondary | ICD-10-CM

## 2015-11-27 DIAGNOSIS — Z3483 Encounter for supervision of other normal pregnancy, third trimester: Secondary | ICD-10-CM

## 2015-11-27 DIAGNOSIS — O99119 Other diseases of the blood and blood-forming organs and certain disorders involving the immune mechanism complicating pregnancy, unspecified trimester: Secondary | ICD-10-CM

## 2015-11-27 DIAGNOSIS — O9989 Other specified diseases and conditions complicating pregnancy, childbirth and the puerperium: Secondary | ICD-10-CM

## 2015-11-27 DIAGNOSIS — D696 Thrombocytopenia, unspecified: Secondary | ICD-10-CM

## 2015-11-27 DIAGNOSIS — Z131 Encounter for screening for diabetes mellitus: Secondary | ICD-10-CM

## 2015-11-27 DIAGNOSIS — Z3493 Encounter for supervision of normal pregnancy, unspecified, third trimester: Secondary | ICD-10-CM

## 2015-11-27 DIAGNOSIS — O26893 Other specified pregnancy related conditions, third trimester: Secondary | ICD-10-CM

## 2015-11-27 LAB — POCT URINALYSIS DIPSTICK
BILIRUBIN UA: NEGATIVE
Glucose, UA: NEGATIVE
Ketones, UA: NEGATIVE
LEUKOCYTES UA: NEGATIVE
NITRITE UA: NEGATIVE
PH UA: 7.5
Protein, UA: NEGATIVE
RBC UA: NEGATIVE
Spec Grav, UA: 1.01
UROBILINOGEN UA: NEGATIVE

## 2015-11-27 NOTE — Progress Notes (Signed)
ROB: Patient denies major complaints.  Notes that she is still having back pain but has improved now that she is not working, still notes having to take breaks every few hours as she is also becoming more short of breath with exertion. To continue modified bed rest. For glucola, Tdap and sign blood consent today.   RTC in 2 weeks.

## 2015-11-28 ENCOUNTER — Telehealth: Payer: Self-pay

## 2015-11-28 LAB — GLUCOSE, 1 HOUR GESTATIONAL: Gestational Diabetes Screen: 114 mg/dL (ref 65–139)

## 2015-11-28 LAB — HEMOGLOBIN AND HEMATOCRIT, BLOOD
Hematocrit: 33.3 % — ABNORMAL LOW (ref 34.0–46.6)
Hemoglobin: 11.1 g/dL (ref 11.1–15.9)

## 2015-11-28 NOTE — Telephone Encounter (Signed)
-----   Message from Hildred LaserAnika Cherry, MD sent at 11/28/2015  3:21 PM EST ----- Normal glucola and Hgb.  Please inform.

## 2015-11-28 NOTE — Telephone Encounter (Signed)
Informed pt of normal glucola and hgb.

## 2015-12-03 ENCOUNTER — Telehealth: Payer: Self-pay | Admitting: Obstetrics and Gynecology

## 2015-12-03 NOTE — Telephone Encounter (Signed)
Pt called and wanted you to call her back ASAP, about the paper work that was filled out. Pt is ware that you were out of the office on Monday and that you would be back on Tuesday.

## 2015-12-04 NOTE — Telephone Encounter (Signed)
Called pt she states that her FMLA paperwork was filled out incorrectly and she needs it fixed. Verified that there was indeed an error on the paperwork, called [employer] and requested that they resend forms so that they can be filled out correctly. Apologized to pt for this error and assured her that error would be corrected today.

## 2015-12-07 IMAGING — CT CT CHEST W/ CM
2 of 3 series · 15 of 36 positions shown, 18 images · IV contrast (Omni 300)
Comparison: DG CHEST 1V PORT dated 03/27/2014

CLINICAL DATA: Trauma. Fell into [facility] Barraza, right anterior
lateral rib pain.

EXAM:
CT CHEST WITH CONTRAST
TECHNIQUE: Multidetector CT imaging of the chest was performed during
intravenous contrast administration.
CONTRAST:  80mL OMNIPAQUE IOHEXOL 300 MG/ML  SOLN

[Series 2: thorax 5.0 i31f 1 · axial · 0.60mm/px · z∈[-354,-69]mm · 12 of 67 slices shown, 15 images]
[im 5/67  mediastinal]
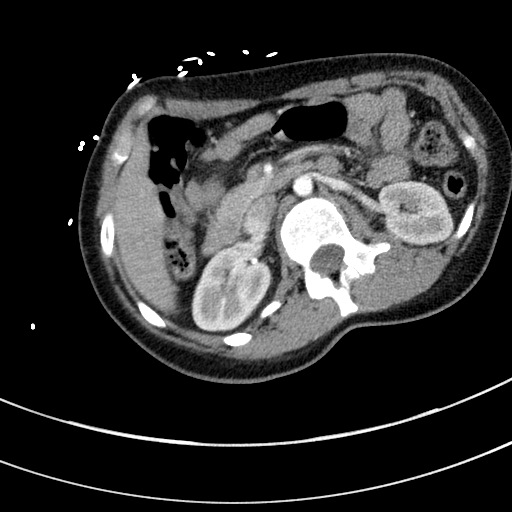
[im 5/67  lung]
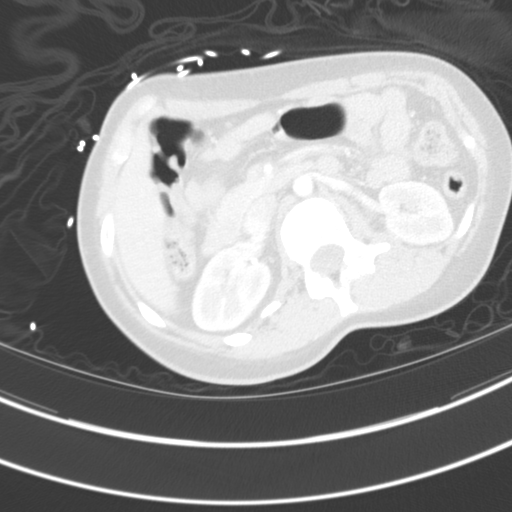
[im 10/67  lung]
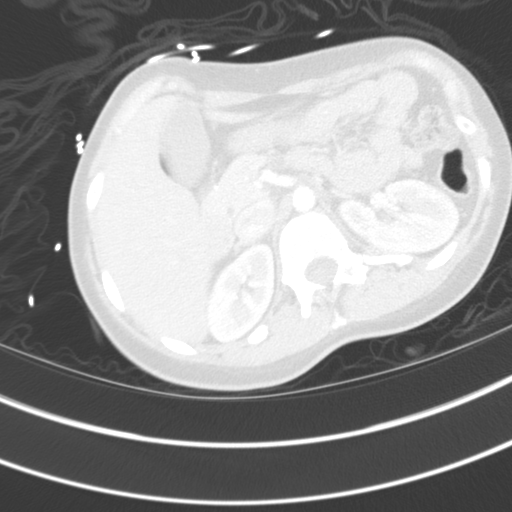
[im 15/67  lung]
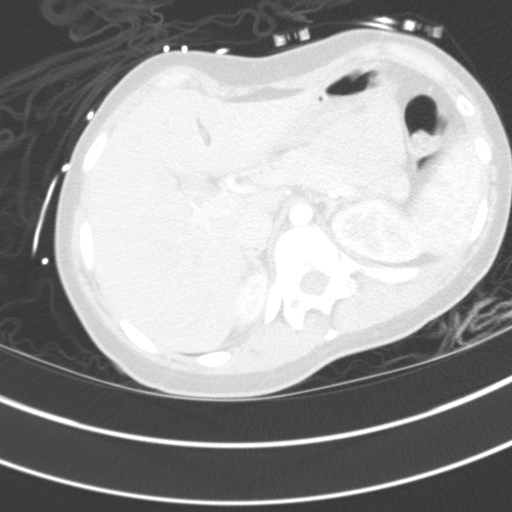
[im 20/67  lung]
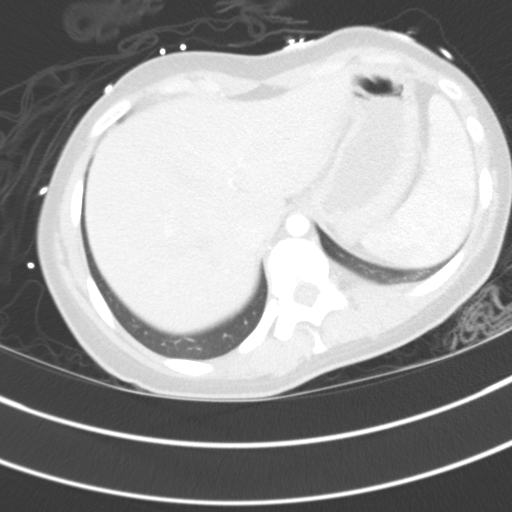
[im 25/67  mediastinal]
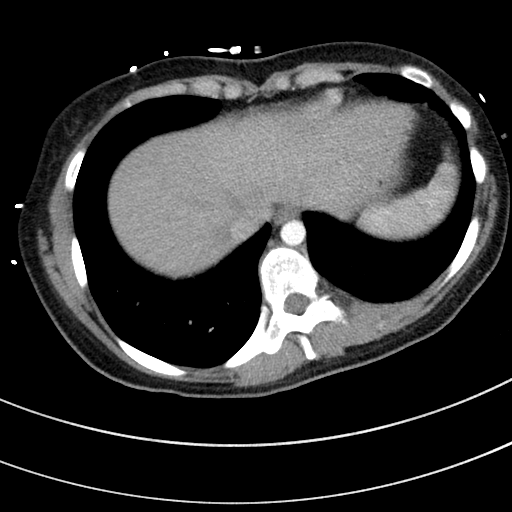
[im 25/67  lung]
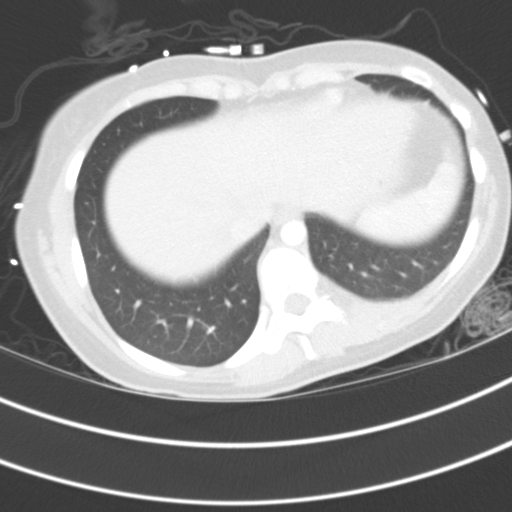
[im 30/67  lung]
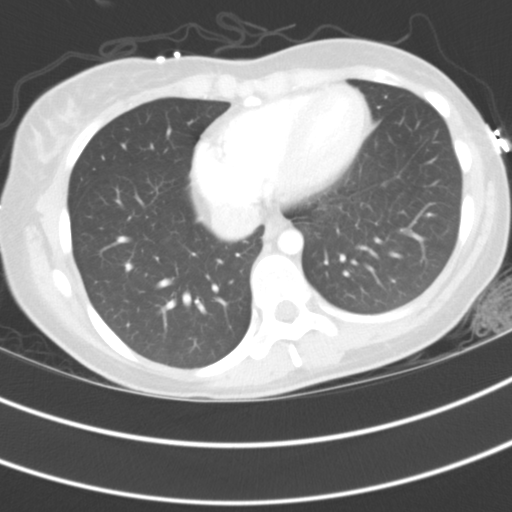
[im 37/67  lung]
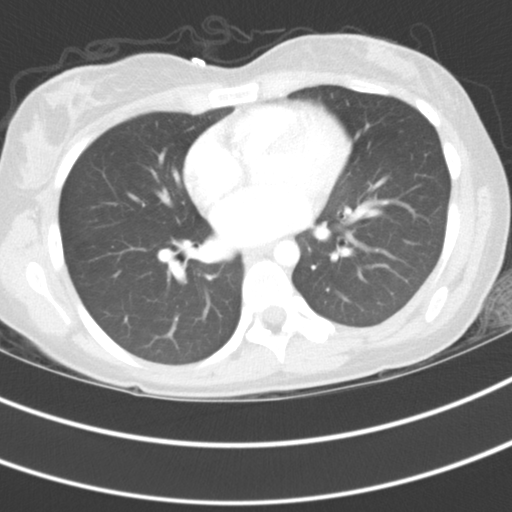
[im 42/67  lung]
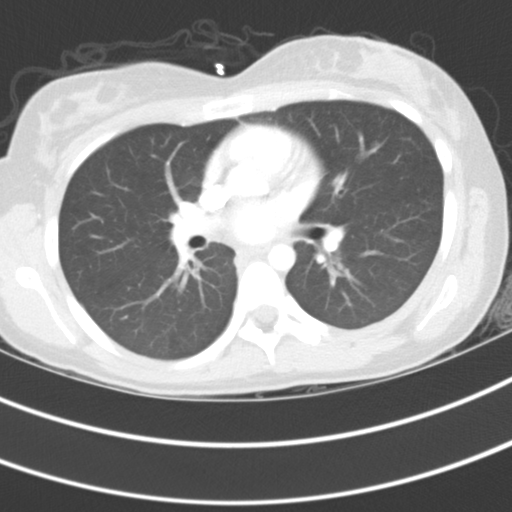
[im 47/67  mediastinal]
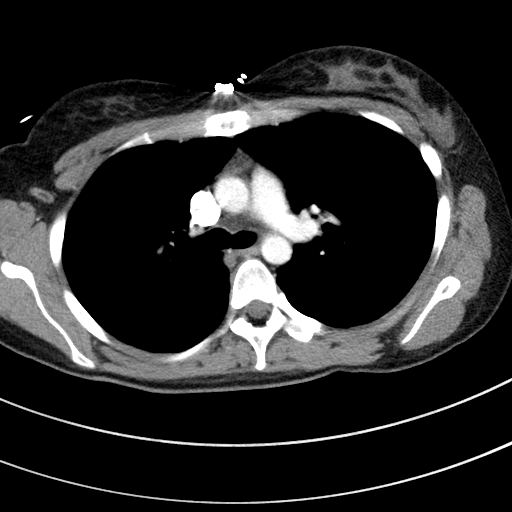
[im 47/67  lung]
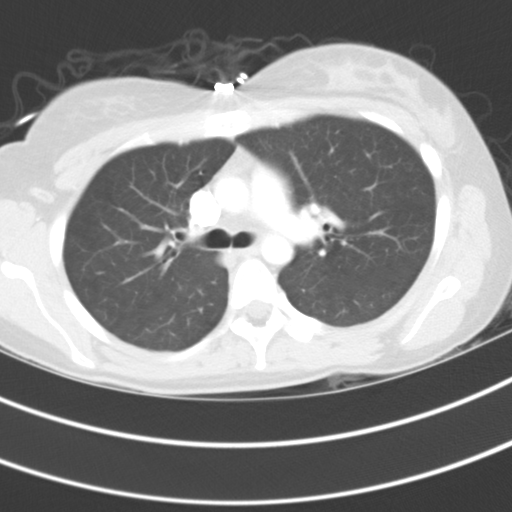
[im 52/67  lung]
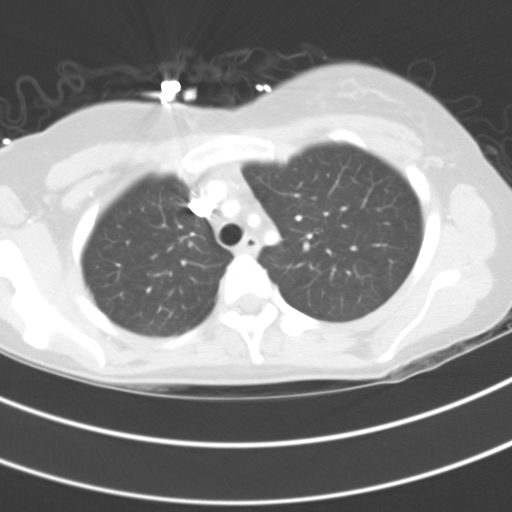
[im 57/67  lung]
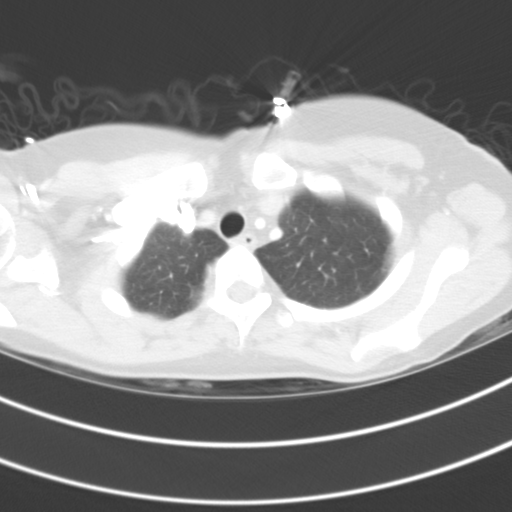
[im 62/67  lung]
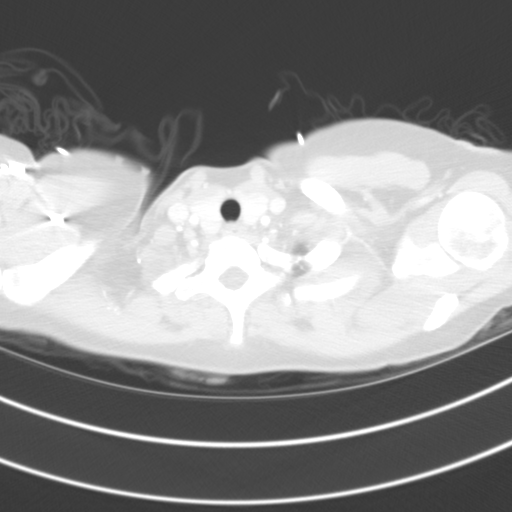

[Series 5: coronal · coronal · 0.65mm/px · 3 of 101 slices shown]
[im 21/101  lung]
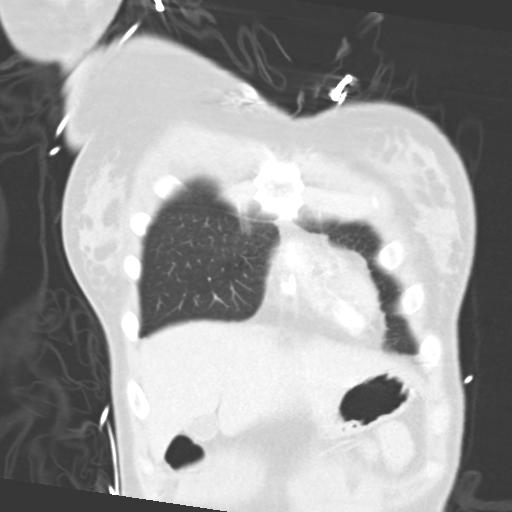
[im 41/101  lung]
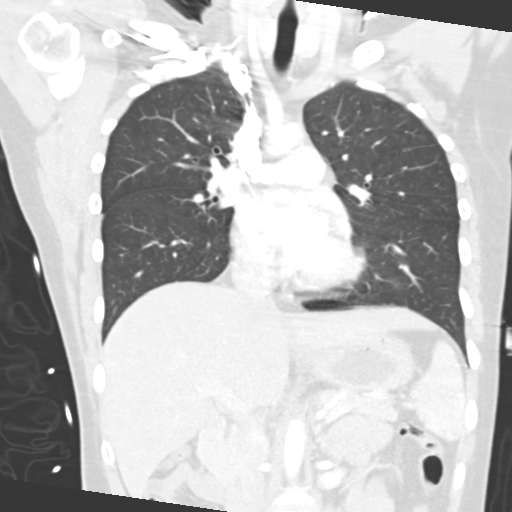
[im 61/101  lung]
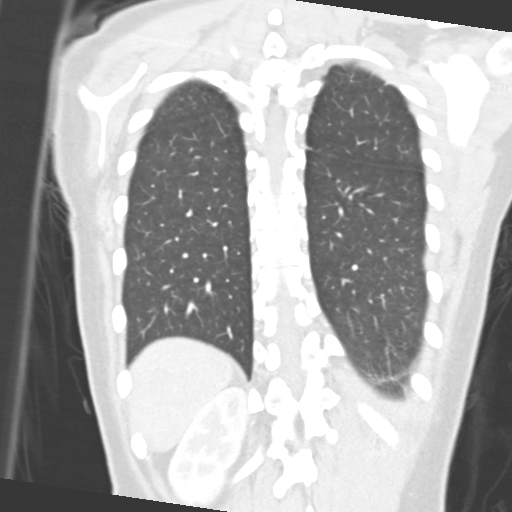

[15 of 36 positions shown; findings below may reference images not displayed]

FINDINGS: The heart and pericardium are unremarkable. Thoracic aorta is normal
in course and caliber, normal. Though not tailored for evaluation of
central pulmonary arterial filling defects.

No pleural effusions or focal consolidations. Tracheobronchial tree
is patent and midline, no pneumothorax. 4 mm ground-glass nodule in
right upper lobe is nonspecific and below size surveillance
recommendations. No lymphadenopathy by CT size criteria. Thoracic
esophagus is unremarkable.

Included view of the abdomen is unremarkable. No rib fracture. Soft
tissues are nonsuspicious.
IMPRESSION: No acute cardiopulmonary process.

  By: Sidiqa Sillab

## 2015-12-07 IMAGING — CR DG CHEST 1V PORT
1 series · 1 of 1 positions shown · non-contrast
Comparison: None.

CLINICAL DATA: Bilateral lower chest pain

EXAM:
PORTABLE CHEST - 1 VIEW

[AP]
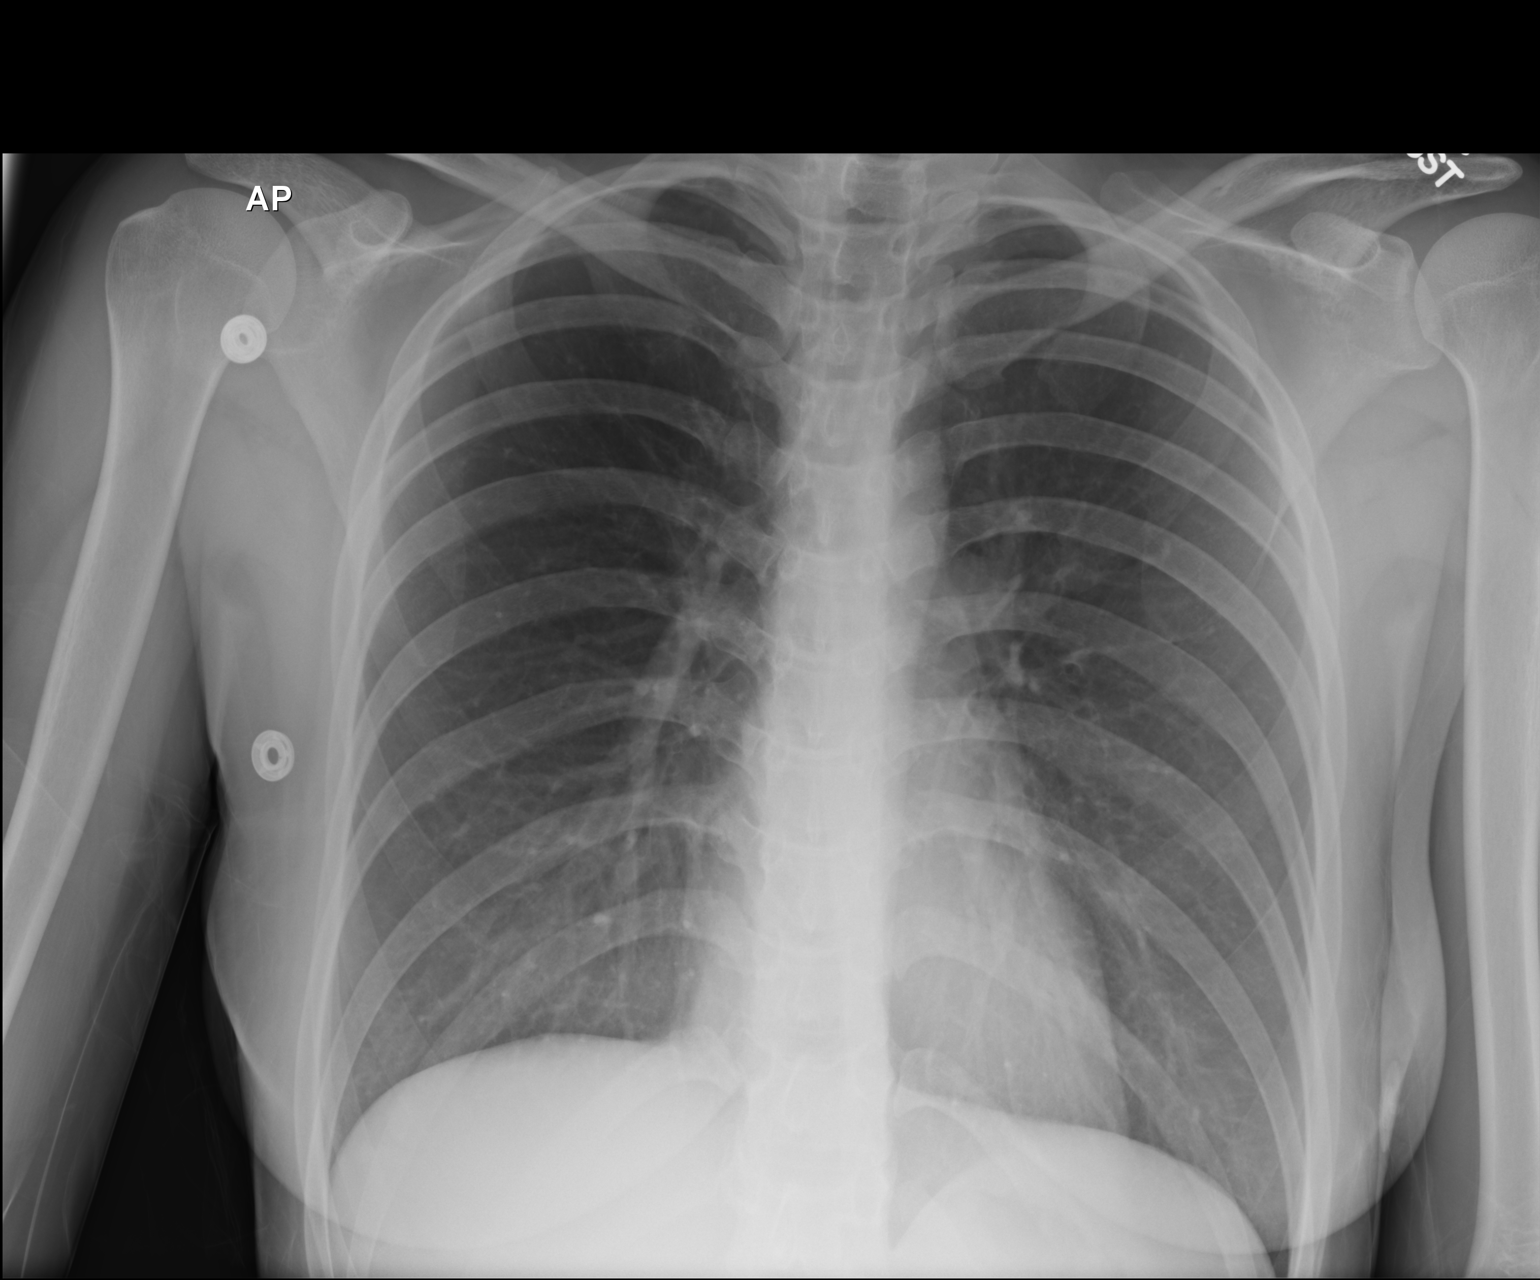

[1 of 1 positions shown; findings below may reference images not displayed]

FINDINGS: The heart size and mediastinal contours are within normal limits.
Both lungs are clear. The visualized skeletal structures are
unremarkable.
IMPRESSION: No active disease.

## 2015-12-12 ENCOUNTER — Telehealth: Payer: Self-pay | Admitting: Obstetrics and Gynecology

## 2015-12-12 NOTE — Telephone Encounter (Signed)
[name] FROM [employer] CALLED AND NEEDS A CALL BACK ABOUT THE PPW FOR Vicki Cruz, SHE WAS PLACED ON MODIFIED [employer]EDREST AND THEY WANT TO KNOW WHAT IS MODIFIED BEDREST, SHE SAID TO CALL HER BACK AT [phone]  WITH REF. # [id number].

## 2015-12-12 NOTE — Telephone Encounter (Signed)
Called Marcelino DusterMichelle and the number provided to me by Zella BallRobin [phone]) and left a message for her to call back.

## 2015-12-13 ENCOUNTER — Ambulatory Visit (INDEPENDENT_AMBULATORY_CARE_PROVIDER_SITE_OTHER): Payer: Managed Care, Other (non HMO) | Admitting: Obstetrics and Gynecology

## 2015-12-13 ENCOUNTER — Encounter: Payer: Managed Care, Other (non HMO) | Admitting: Obstetrics and Gynecology

## 2015-12-13 VITALS — BP 126/63 | HR 110 | Wt 159.5 lb

## 2015-12-13 DIAGNOSIS — O4702 False labor before 37 completed weeks of gestation, second trimester: Secondary | ICD-10-CM

## 2015-12-13 DIAGNOSIS — Z3493 Encounter for supervision of normal pregnancy, unspecified, third trimester: Secondary | ICD-10-CM

## 2015-12-13 LAB — POCT URINALYSIS DIPSTICK
Bilirubin, UA: NEGATIVE
Blood, UA: NEGATIVE
Glucose, UA: NEGATIVE
Ketones, UA: NEGATIVE
LEUKOCYTES UA: NEGATIVE
NITRITE UA: NEGATIVE
PROTEIN UA: NEGATIVE
SPEC GRAV UA: 1.025
UROBILINOGEN UA: NEGATIVE
pH, UA: 6

## 2015-12-13 NOTE — Progress Notes (Signed)
ROB: Denies complaints. Braxton Hicks contractions noted.  Continue limited activity (due chronic back pain and h/o preterm ctx). RTC in 2 weeks.

## 2015-12-13 NOTE — Telephone Encounter (Signed)
Spoke with Marcelino DusterMichelle she stated that she needs clarification on whether the pt is on modified bedrest or just work restrictions. Kandis MannanAdvised [name] from [employer] that pt is indeed on modified bedrest due to chronic back pain. Marcelino DusterMichelle states that she will process pt's paperwork.

## 2015-12-16 NOTE — L&D Delivery Note (Signed)
Delivery Summary for [employer]  Labor Events:   Preterm labor:   Rupture date:   Rupture time:   Rupture type:   Fluid Color:   Induction:   Augmentation:   Complications:   Cervical ripening:          Delivery:   Episiotomy:   Lacerations:   Repair suture:   Repair # of packets:   Blood loss (ml): 500   Information for the patient's newborn:  [employer], [place] [[id number]]    Delivery 02/04/2016 8:01 AM by  C-Section, Low Transverse Sex:  female Gestational Age: [redacted]w[redacted]d Delivery Clinician:  Hildred Laser Living?: Yes        APGARS  One minute Five minutes Ten minutes  Skin color: 0   1      Heart rate: 2   2      Grimace: 2   2      Muscle tone: 2   2      Breathing: 2   2      Totals: 8  9      Presentation/position: Vertex  Left Occiput Anterior Resuscitation: None  Cord information: 3 vessels   Disposition of cord blood: No    Blood gases sent? No Complications: None  Placenta: Delivered: 02/04/2016 8:02 AM  Manual removal  Intact appearance Newborn Measurements: Weight: 8 lb 3.2 oz (3720 g)  Height:    Head circumference:    Chest circumference:    Other providers: Delivery Nurse Transition RN Vergie Living Tiffany D Denmark  Additional  information: Forceps:   Vacuum:   Breech:   Observed anomalies        See Operative Note for details of delivery.    Hildred Laser, MD [facility]

## 2015-12-23 ENCOUNTER — Encounter: Payer: Self-pay | Admitting: *Deleted

## 2015-12-23 ENCOUNTER — Inpatient Hospital Stay
Admission: EM | Admit: 2015-12-23 | Discharge: 2015-12-23 | Disposition: A | Discharge status: Home / Self Care | Payer: Managed Care, Other (non HMO) | Attending: Obstetrics and Gynecology | Admitting: Obstetrics and Gynecology

## 2015-12-23 DIAGNOSIS — Z3A34 34 weeks gestation of pregnancy: Secondary | ICD-10-CM | POA: Diagnosis not present

## 2015-12-23 DIAGNOSIS — D696 Thrombocytopenia, unspecified: Secondary | ICD-10-CM | POA: Diagnosis not present

## 2015-12-23 DIAGNOSIS — O99119 Other diseases of the blood and blood-forming organs and certain disorders involving the immune mechanism complicating pregnancy, unspecified trimester: Secondary | ICD-10-CM

## 2015-12-23 LAB — URINALYSIS COMPLETE WITH MICROSCOPIC (ARMC ONLY)
BILIRUBIN URINE: NEGATIVE
GLUCOSE, UA: NEGATIVE mg/dL
Hgb urine dipstick: NEGATIVE
Ketones, ur: NEGATIVE mg/dL
LEUKOCYTES UA: NEGATIVE
Nitrite: NEGATIVE
Protein, ur: NEGATIVE mg/dL
Specific Gravity, Urine: 1.01 (ref 1.005–1.030)
pH: 7 (ref 5.0–8.0)

## 2015-12-23 MED ORDER — NITROFURANTOIN MONOHYD MACRO 100 MG PO CAPS
100.0000 mg | ORAL_CAPSULE | Freq: Two times a day (BID) | ORAL | Status: DC
  Administered 2015-12-23: 100 mg via ORAL
  Filled 2015-12-23: qty 1

## 2015-12-23 NOTE — Progress Notes (Signed)
Pt provided with hospital d/c instructions, and specific instructions as previously noted by [facility] [place]. Pt voicing understanding, no further questions. Pt awaiting spouse to retrieve car

## 2015-12-23 NOTE — OB Triage Note (Signed)
Pt recvd from ER. Instructed to changed into a gown. Hooked up to monitor and talked about POC. Pt verbalized understanding

## 2015-12-23 NOTE — Discharge Instructions (Signed)
Pt to call office in am to check on appt time for tomorrow- if need to cancel for tomorow, Nurse Midwife would like you to reschedule for this week Pt to pick up prescription in am, and take as instructed by Nurse Midwife. Pt to return if contractions worsen, fluid or bleeding develops vaginally, decreased fetal movement, or if further concerns develop Pt to increase true fluid intake

## 2015-12-23 NOTE — OB Triage Note (Signed)
TO call Office in am to check on appt time.\ If need to cancel such- please reschedule for this week. Call or come to hospital if contractions become more painful, decreased fetal movement, bleeding or fluid loss occurs vaginally or further concerns develop. In crease true fluid intake Pick up prescription in am, take as instructed by Nurse Midwife
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
## 2015-12-24 ENCOUNTER — Encounter: Payer: Managed Care, Other (non HMO) | Admitting: Certified Nurse Midwife

## 2015-12-25 ENCOUNTER — Ambulatory Visit (INDEPENDENT_AMBULATORY_CARE_PROVIDER_SITE_OTHER): Payer: Managed Care, Other (non HMO) | Admitting: Obstetrics and Gynecology

## 2015-12-25 ENCOUNTER — Encounter: Payer: Self-pay | Admitting: Obstetrics and Gynecology

## 2015-12-25 VITALS — BP 102/68 | HR 109 | Wt 161.3 lb

## 2015-12-25 DIAGNOSIS — Z3493 Encounter for supervision of normal pregnancy, unspecified, third trimester: Secondary | ICD-10-CM

## 2015-12-25 LAB — POCT URINALYSIS DIPSTICK
BILIRUBIN UA: NEGATIVE
Glucose, UA: NEGATIVE
KETONES UA: 2
LEUKOCYTES UA: NEGATIVE
Nitrite, UA: NEGATIVE
PH UA: 6
PROTEIN UA: NEGATIVE
RBC UA: NEGATIVE
SPEC GRAV UA: 1.025
Urobilinogen, UA: 0.2

## 2015-12-25 LAB — URINE CULTURE

## 2015-12-25 NOTE — Progress Notes (Signed)
Seen in er- given macrobid for UTI-Having ctx on/off. No vaginal bleeding or discharge.  Cervix: Long/closed/-2/bag of water intact.  History of prior C-section at 42 weeks for failure to progress.  Reassurance is given to the patient.

## 2015-12-28 ENCOUNTER — Telehealth: Payer: Self-pay

## 2015-12-28 NOTE — Telephone Encounter (Signed)
-----   Message from Purcell NailsMelody N Shambley, [place] sent at 12/27/2015  5:36 PM EST ----- Please see how she is feeling as urine culture came back with contamination-may need to drop off another sample

## 2015-12-28 NOTE — Telephone Encounter (Signed)
Called pt left message informing her of contamination in her recent urine sample, and the need to drop off a new clean catch sample if she is still symptomatic.

## 2016-01-08 ENCOUNTER — Ambulatory Visit (INDEPENDENT_AMBULATORY_CARE_PROVIDER_SITE_OTHER): Payer: Managed Care, Other (non HMO) | Admitting: Obstetrics and Gynecology

## 2016-01-08 VITALS — BP 94/61 | HR 98 | Wt 163.1 lb

## 2016-01-08 DIAGNOSIS — Z113 Encounter for screening for infections with a predominantly sexual mode of transmission: Secondary | ICD-10-CM

## 2016-01-08 DIAGNOSIS — Z369 Encounter for antenatal screening, unspecified: Secondary | ICD-10-CM

## 2016-01-08 DIAGNOSIS — Z3493 Encounter for supervision of normal pregnancy, unspecified, third trimester: Secondary | ICD-10-CM

## 2016-01-08 DIAGNOSIS — Z36 Encounter for antenatal screening of mother: Secondary | ICD-10-CM

## 2016-01-08 DIAGNOSIS — Z3483 Encounter for supervision of other normal pregnancy, third trimester: Secondary | ICD-10-CM

## 2016-01-08 LAB — POCT URINALYSIS DIPSTICK
BILIRUBIN UA: NEGATIVE
Blood, UA: NEGATIVE
GLUCOSE UA: NEGATIVE
Ketones, UA: NEGATIVE
LEUKOCYTES UA: NEGATIVE
NITRITE UA: NEGATIVE
Spec Grav, UA: 1.015
Urobilinogen, UA: NEGATIVE
pH, UA: 7

## 2016-01-09 NOTE — Progress Notes (Signed)
ROB: Patient notes irregular ctx.  36 week labs done today.  Patient inquires about BTL at time of C-section.  Discussion had regarding all modes of contraception (patient notes h/o uterine perforation with IUD, is afraid of anything else "implanted (i.e. Nexplanon), and notes difficulty with compliance with OCPs and is leary of Depo Provera due to weight gain).  DIscussed risk of regret based on patient's age. Will continue discussion with patient up until time of C-section. Desires to breastfeed.  36 week labs. RTC in 1 week.  Needs urine culture for TOC next visit for recent UTI.

## 2016-01-11 LAB — STREP GP B NAA+RFLX: STREP GP B NAA+RFLX: NEGATIVE

## 2016-01-12 LAB — GC/CHLAMYDIA PROBE AMP
Chlamydia trachomatis, NAA: NEGATIVE
Neisseria gonorrhoeae by PCR: NEGATIVE

## 2016-01-15 ENCOUNTER — Ambulatory Visit (INDEPENDENT_AMBULATORY_CARE_PROVIDER_SITE_OTHER): Payer: Managed Care, Other (non HMO) | Admitting: Obstetrics and Gynecology

## 2016-01-15 ENCOUNTER — Encounter: Payer: Managed Care, Other (non HMO) | Admitting: Obstetrics and Gynecology

## 2016-01-15 VITALS — BP 116/67 | HR 100 | Wt 165.1 lb

## 2016-01-15 DIAGNOSIS — O9989 Other specified diseases and conditions complicating pregnancy, childbirth and the puerperium: Secondary | ICD-10-CM

## 2016-01-15 DIAGNOSIS — N949 Unspecified condition associated with female genital organs and menstrual cycle: Secondary | ICD-10-CM | POA: Diagnosis not present

## 2016-01-15 DIAGNOSIS — Z3493 Encounter for supervision of normal pregnancy, unspecified, third trimester: Secondary | ICD-10-CM

## 2016-01-15 DIAGNOSIS — O26899 Other specified pregnancy related conditions, unspecified trimester: Secondary | ICD-10-CM

## 2016-01-15 DIAGNOSIS — R102 Pelvic and perineal pain unspecified side: Secondary | ICD-10-CM

## 2016-01-15 LAB — POCT URINALYSIS DIPSTICK
BILIRUBIN UA: NEGATIVE
Glucose, UA: NEGATIVE
KETONES UA: NEGATIVE
Leukocytes, UA: NEGATIVE
NITRITE UA: NEGATIVE
PH UA: 6.5
RBC UA: NEGATIVE
SPEC GRAV UA: 1.015
Urobilinogen, UA: 1

## 2016-01-15 NOTE — Progress Notes (Signed)
ROB: Patient c/o increased pelvic pressure, cramping, and low back pain.  Cervix with no significant change from prior exam.  NST performed to assess for persistent contractions.  Uterine irritability noted.  PTL precautions given.  36 week labs negative.  RTC in 1 week.   FETAL SURVEILLANCE TESTING SUMMARY  INDICATIONS: rule out uterine contractions  FHR baseline: 135  RESULTS: reactive  COMMENTS: Uterine irritability  PLAN:  1. Continue fetal kick counts twice a day.  2. Preterm labor precautions given.

## 2016-01-22 ENCOUNTER — Ambulatory Visit (INDEPENDENT_AMBULATORY_CARE_PROVIDER_SITE_OTHER): Payer: Managed Care, Other (non HMO) | Admitting: Obstetrics and Gynecology

## 2016-01-22 VITALS — BP 107/66 | HR 92 | Wt 169.6 lb

## 2016-01-22 DIAGNOSIS — Z3483 Encounter for supervision of other normal pregnancy, third trimester: Secondary | ICD-10-CM

## 2016-01-22 DIAGNOSIS — Z3493 Encounter for supervision of normal pregnancy, unspecified, third trimester: Secondary | ICD-10-CM

## 2016-01-22 LAB — POCT URINALYSIS DIPSTICK
BILIRUBIN UA: NEGATIVE
Blood, UA: NEGATIVE
GLUCOSE UA: NEGATIVE
KETONES UA: NEGATIVE
NITRITE UA: NEGATIVE
PH UA: 7
Protein, UA: NEGATIVE
Spec Grav, UA: 1.01
Urobilinogen, UA: NEGATIVE

## 2016-01-22 NOTE — Progress Notes (Signed)
ROB: Patient continues to note increased pelvic pressure and cramping.  Labor precautions.  36 week labs neg.  RTC in 1 week.  For pre-op at that time and discuss contraception again (desires BTL).

## 2016-01-23 ENCOUNTER — Telehealth: Payer: Self-pay | Admitting: Obstetrics and Gynecology

## 2016-01-23 NOTE — Telephone Encounter (Signed)
Vicki Cruz CALLED AND SAID WOULD YOU PLEASE SEND A VERIFICATION TO HER FMLA TO SAY BED REST UNTIL 2/20. THE LAST ONE SAID UNTIL 2/8. SHE SAID PLEASE AND THANK YOU SHE APPRECIATES THIS.

## 2016-01-23 NOTE — Telephone Encounter (Signed)
I will need her employer to send me a form to be filled out, they usually will not take just a note. I will need a form or at least a telephone number for who she wants me to call. Do we still have her FMLA forms up front? Thanks

## 2016-01-24 NOTE — Telephone Encounter (Signed)
I will try to change the date on the forms I have, thanks for getting them for me.

## 2016-01-24 NOTE — Telephone Encounter (Signed)
Do you still need the employer to fax you a paper since we still have the original, can u change the dates

## 2016-01-27 ENCOUNTER — Observation Stay
Admission: EM | Admit: 2016-01-27 | Discharge: 2016-01-27 | Disposition: A | Discharge status: Home / Self Care | Payer: Managed Care, Other (non HMO) | Attending: Obstetrics and Gynecology | Admitting: Obstetrics and Gynecology

## 2016-01-27 DIAGNOSIS — O26893 Other specified pregnancy related conditions, third trimester: Secondary | ICD-10-CM

## 2016-01-27 DIAGNOSIS — Z3A38 38 weeks gestation of pregnancy: Secondary | ICD-10-CM | POA: Insufficient documentation

## 2016-01-27 DIAGNOSIS — O34219 Maternal care for unspecified type scar from previous cesarean delivery: Secondary | ICD-10-CM | POA: Diagnosis not present

## 2016-01-27 DIAGNOSIS — M549 Dorsalgia, unspecified: Secondary | ICD-10-CM | POA: Diagnosis present

## 2016-01-27 NOTE — Discharge Instructions (Signed)
Patient is having contractions without cervical changes.  Patient instructed to come back for cervical check if her pain increases, leaking of fluid or if she has any concerns.  Next appointment scheduled on Tuesday (2/14) with Dr. Valentino Saxon.

## 2016-01-28 NOTE — Final Progress Note (Signed)
L&D OB Triage Note  SUBJECTIVE Vicki Cruz is a 25 y.o. G3P1011 female at [redacted]w[redacted]d, EDD Estimated Date of Delivery: 02/11/16 who presented to triage with complaints of contractions. History of Prior C/S.    OBJECTIVE Nursing Evaluation: BP 118/79 mmHg  Pulse 78  Temp(Src) 98.8 F (37.1 C) (Oral)  Resp 14  Ht  (1.626 m)  Wt 169 lb (76.658 kg)  BMI 28.99 kg/m2  LMP  (LMP Unknown) no significant findings  for labor. Cervix unchanged on serial exams.  NST was performed and has been reviewed by me.  NST INTERPRETATION: Indications: Contractions; Prior C/S  Mode: External Baseline Rate (A): 135 bpm Variability: Moderate Accelerations: 15 x 15       Contraction Frequency (min): 2-3  ASSESSMENT Impression:  1. Pregnancy:  G3P1011 at [redacted]w[redacted]d , EDD Estimated Date of Delivery: 02/11/16 2.  Reactive NST  PLAN 1. Reassurance given 2. Discharge home with bleeding/labor precautioLouanne Beltontinue routine prenatal care.   Herold Harms, MD

## 2016-01-29 ENCOUNTER — Encounter: Payer: Managed Care, Other (non HMO) | Admitting: Obstetrics and Gynecology

## 2016-01-29 ENCOUNTER — Ambulatory Visit (INDEPENDENT_AMBULATORY_CARE_PROVIDER_SITE_OTHER): Payer: Managed Care, Other (non HMO) | Admitting: Obstetrics and Gynecology

## 2016-01-29 VITALS — BP 127/73 | HR 93 | Wt 169.5 lb

## 2016-01-29 DIAGNOSIS — M549 Dorsalgia, unspecified: Secondary | ICD-10-CM

## 2016-01-29 DIAGNOSIS — O26893 Other specified pregnancy related conditions, third trimester: Secondary | ICD-10-CM

## 2016-01-29 DIAGNOSIS — O479 False labor, unspecified: Secondary | ICD-10-CM

## 2016-01-29 DIAGNOSIS — Z98891 History of uterine scar from previous surgery: Secondary | ICD-10-CM

## 2016-01-29 LAB — POCT URINALYSIS DIPSTICK
Bilirubin, UA: NEGATIVE
Blood, UA: NEGATIVE
GLUCOSE UA: NEGATIVE
KETONES UA: NEGATIVE
LEUKOCYTES UA: NEGATIVE
Nitrite, UA: NEGATIVE
SPEC GRAV UA: 1.02
Urobilinogen, UA: NEGATIVE
pH, UA: 6.5

## 2016-01-29 NOTE — Progress Notes (Signed)
ROB: Patient complains of contractions, q 2-7 min.  Was seen in L&D triage 2 days ago with similar complaints. Cervix at that time was 1 cm dilated.  Cervix unchanged today.  Labor precautions given.  Advised on Tylenol prn, warm baths.  Is scheduled for repeat C-section 02/04/16.  Discussion had again regarding  BTL.  Notes that procedure is still desired, has had time to think further and discuss again with husband.  Will perform at time of C-section.

## 2016-01-31 NOTE — Final Progress Note (Signed)
L&D OB Triage Note  SUBJECTIVE Vicki Cruz is a 25 y.o. G52P1011 female at [redacted]w[redacted]d, EDD Estimated Date of Delivery: 02/11/16 who presented to triage with complaints of contractions. Patient has history of previous cesarean section   OBJECTIVE Nursing Evaluation: BP 127/78 mmHg  Pulse 96  Temp(Src) 98.5 F (36.9 C) (Oral)  Resp 16  LMP  (LMP Unknown) no significant findings for labor  NST was performed and has been reviewed by me.  NST INTERPRETATION: Indications: Contractions; history of prior C-section  Mode: External Baseline Rate (A): 135 bpm Variability: Moderate Accelerations: 15 x 15 Decelerations: None     Contraction Frequency (min): same  ASSESSMENT Impression:  1. Pregnancy:  G3P1011 at [redacted]w[redacted]d , EDD Estimated Date of Delivery: 02/11/16 2.  Reactive NST 3. No evidence of cervical change on serial exams by nursing.  PLAN 1. Reassurance given 2. Discharge home with bleeding/labor precautions.  3. Continue routine prenatal care.   Herold Harms, MD

## 2016-02-01 ENCOUNTER — Observation Stay
Admission: RE | Admit: 2016-02-01 | Discharge: 2016-02-01 | Disposition: A | Discharge status: Home / Self Care | Payer: Managed Care, Other (non HMO) | Attending: Obstetrics and Gynecology | Admitting: Obstetrics and Gynecology

## 2016-02-01 ENCOUNTER — Encounter
Admission: RE | Admit: 2016-02-01 | Discharge: 2016-02-01 | Disposition: A | Discharge status: Home / Self Care | Payer: Managed Care, Other (non HMO) | Source: Ambulatory Visit | Attending: Obstetrics and Gynecology | Admitting: Obstetrics and Gynecology

## 2016-02-01 DIAGNOSIS — D696 Thrombocytopenia, unspecified: Secondary | ICD-10-CM

## 2016-02-01 DIAGNOSIS — O34219 Maternal care for unspecified type scar from previous cesarean delivery: Secondary | ICD-10-CM | POA: Insufficient documentation

## 2016-02-01 DIAGNOSIS — Z3A38 38 weeks gestation of pregnancy: Secondary | ICD-10-CM | POA: Diagnosis not present

## 2016-02-01 DIAGNOSIS — O99119 Other diseases of the blood and blood-forming organs and certain disorders involving the immune mechanism complicating pregnancy, unspecified trimester: Secondary | ICD-10-CM

## 2016-02-01 LAB — CBC
HEMATOCRIT: 31.1 % — AB (ref 35.0–47.0)
HEMOGLOBIN: 10.1 g/dL — AB (ref 12.0–16.0)
MCH: 24 pg — ABNORMAL LOW (ref 26.0–34.0)
MCHC: 32.4 g/dL (ref 32.0–36.0)
MCV: 74 fL — AB (ref 80.0–100.0)
Platelets: 123 10*3/uL — ABNORMAL LOW (ref 150–440)
RBC: 4.2 MIL/uL (ref 3.80–5.20)
RDW: 17.2 % — AB (ref 11.5–14.5)
WBC: 10.8 10*3/uL (ref 3.6–11.0)

## 2016-02-01 LAB — TYPE AND SCREEN
ABO/RH(D): O POS
ANTIBODY SCREEN: NEGATIVE
Extend sample reason: UNDETERMINED

## 2016-02-01 LAB — RAPID HIV SCREEN (HIV 1/2 AB+AG)
HIV 1/2 ANTIBODIES: NONREACTIVE
HIV-1 P24 Antigen - HIV24: NONREACTIVE

## 2016-02-01 LAB — ABO/RH: ABO/RH(D): O POS

## 2016-02-01 MED ORDER — TERBUTALINE SULFATE 1 MG/ML IJ SOLN
0.2500 mg | Freq: Once | INTRAMUSCULAR | Status: AC
  Administered 2016-02-01: 0.25 mg via SUBCUTANEOUS
  Filled 2016-02-01: qty 1

## 2016-02-01 NOTE — OB Triage Note (Signed)
Ms. Sallyanne Havers here with c/o contractions since 01/27/16, was seen for pre-op and advised to come for evaluation.

## 2016-02-01 NOTE — Discharge Instructions (Signed)
Return to the Emergency Room for signs of labor, including  Leaking of fluid   Regular, painful contractions that do not go away when you drink water and relax   Drink plenty of fluid, get plenty of rest. Return to the hospital Monday morning at 7:15am

## 2016-02-01 NOTE — Final Progress Note (Signed)
L&D OB Triage Note  Vicki Cruz is a 25 y.o. G32P1011 female at [redacted]w[redacted]d, EDD Estimated Date of Delivery: 02/11/16 who presented to triage after pre-operative appointment for scheduled C-section (to be performed 02/04/2016) for complaints of contractions.  She was evaluated by the nurses with no significant findings of labor, however was noted to be contracting q 2-3 minutes. Vital signs stable. An NST was performed and has been reviewed by MD. She was treated with 1 dose of terbutaline.   NST INTERPRETATION: Indications: rule out uterine contractions  Mode: External Baseline Rate (A): 145 bpm Variability: Moderate Accelerations: 15 x 15 Decelerations: None     Contraction Frequency (min): irregular  Impression: reactive   Assessment:  1) Uterine contractions 2) H/o C-section x 1  Plan: NST performed was reviewed and was found to be reactive.Cervix was noted to be unchanged from clinic exam (1/30/-3/posterior).  She was discharged home with bleeding/labor precautions.  Continue routine prenatal care. Follow up for scheduled C-section on 02/04/2016.      Hildred Laser, MD [facility]

## 2016-02-01 NOTE — Patient Instructions (Signed)
  Your procedure is scheduled on:02/04/16  At 0715 am Report to birthplace  Third floor .  Remember: Instructions that are not followed completely may result in serious medical risk, up to and including death, or upon the discretion of your surgeon and anesthesiologist your surgery may need to be rescheduled.    __x__ 1. Do not eat food or drink liquids after midnight. No gum chewing or hard candies.     _x_ 2. No Alcohol for 24 hours before or after surgery.   ____ 3. Bring all medications with you on the day of surgery if instructed.    _x__ 4. Notify your doctor if there is any change in your medical condition     (cold, fever, infections).     Do not wear jewelry, make-up, hairpins, clips or nail polish.  Do not wear lotions, powders, or perfumes. You may wear deodorant.  Do not shave 48 hours prior to surgery. Men may shave face and neck.  Do not bring valuables to the hospital.    [facility] is not responsible for any belongings or valuables.               Contacts, dentures or bridgework may not be worn into surgery.  Leave your suitcase in the car. After surgery it may be brought to your room.  For patients admitted to the hospital, discharge time is determined by your                treatment team.   Patients discharged the day of surgery will not be allowed to drive home.   Please read over the following fact sheets that you were given:   Surgical Site Infection Prevention   ____ Take these medicines the morning of surgery with A SIP OF WATER:    1. none  2.   3.   4.  5.  6.  ____ Fleet Enema (as directed)   ____ Use CHG Soap as directed  ____ Use inhalers on the day of surgery  ____ Stop metformin 2 days prior to surgery    ____ Take 1/2 of usual insulin dose the night before surgery and none on the morning of surgery.   ____ Stop Coumadin/Plavix/aspirin on  ____ Stop Anti-inflammatories on  ____ Stop supplements until after surgery.    ____ Bring  C-Pap to the hospital.    Practice turn,cough,deep breathing  Practice incentive spirometry

## 2016-02-01 NOTE — Pre-Procedure Instructions (Signed)
Patient arrived for preop visit stating contractions all week but worse today.notified kelly in birthplace patient on her way to be evaluated. Told she should be sent to ed first. Patient already on her way to birthplace

## 2016-02-02 LAB — RPR: RPR: NONREACTIVE

## 2016-02-02 LAB — CULTURE, OB URINE: SPECIAL REQUESTS: NORMAL

## 2016-02-04 ENCOUNTER — Inpatient Hospital Stay: Payer: Managed Care, Other (non HMO) | Admitting: Anesthesiology

## 2016-02-04 ENCOUNTER — Inpatient Hospital Stay
Admission: RE | Admit: 2016-02-04 | Discharge: 2016-02-06 | DRG: 765 | Disposition: A | Discharge status: Home / Self Care | Payer: Managed Care, Other (non HMO) | Source: Ambulatory Visit | Attending: Obstetrics and Gynecology | Admitting: Obstetrics and Gynecology

## 2016-02-04 ENCOUNTER — Encounter
Admission: RE | Disposition: A | Discharge status: Home / Self Care | Payer: Self-pay | Source: Ambulatory Visit | Attending: Obstetrics and Gynecology

## 2016-02-04 DIAGNOSIS — D696 Thrombocytopenia, unspecified: Secondary | ICD-10-CM

## 2016-02-04 DIAGNOSIS — Z302 Encounter for sterilization: Secondary | ICD-10-CM

## 2016-02-04 DIAGNOSIS — O872 Hemorrhoids in the puerperium: Secondary | ICD-10-CM | POA: Diagnosis present

## 2016-02-04 DIAGNOSIS — O9902 Anemia complicating childbirth: Secondary | ICD-10-CM | POA: Diagnosis present

## 2016-02-04 DIAGNOSIS — Z3A39 39 weeks gestation of pregnancy: Secondary | ICD-10-CM

## 2016-02-04 DIAGNOSIS — O34211 Maternal care for low transverse scar from previous cesarean delivery: Secondary | ICD-10-CM | POA: Diagnosis present

## 2016-02-04 DIAGNOSIS — D649 Anemia, unspecified: Secondary | ICD-10-CM | POA: Diagnosis present

## 2016-02-04 DIAGNOSIS — Z98891 History of uterine scar from previous surgery: Secondary | ICD-10-CM

## 2016-02-04 DIAGNOSIS — Z3483 Encounter for supervision of other normal pregnancy, third trimester: Secondary | ICD-10-CM | POA: Diagnosis not present

## 2016-02-04 DIAGNOSIS — O99119 Other diseases of the blood and blood-forming organs and certain disorders involving the immune mechanism complicating pregnancy, unspecified trimester: Secondary | ICD-10-CM

## 2016-02-04 DIAGNOSIS — O34219 Maternal care for unspecified type scar from previous cesarean delivery: Secondary | ICD-10-CM | POA: Diagnosis present

## 2016-02-04 SURGERY — Surgical Case
Anesthesia: Spinal | Wound class: Clean Contaminated

## 2016-02-04 MED ORDER — DIBUCAINE 1 % RE OINT
1.0000 "application " | TOPICAL_OINTMENT | RECTAL | Status: DC | PRN
  Administered 2016-02-05: 1 via RECTAL
  Filled 2016-02-04: qty 28

## 2016-02-04 MED ORDER — MORPHINE SULFATE (PF) 0.5 MG/ML IJ SOLN
INTRAMUSCULAR | Status: DC | PRN
  Administered 2016-02-04: .2 mg via EPIDURAL

## 2016-02-04 MED ORDER — OXYCODONE-ACETAMINOPHEN 5-325 MG PO TABS: 1.0000 | ORAL_TABLET | ORAL | Status: DC | PRN

## 2016-02-04 MED ORDER — PRENATAL MULTIVITAMIN CH
1.0000 | ORAL_TABLET | Freq: Every day | ORAL | Status: DC
  Filled 2016-02-04: qty 1

## 2016-02-04 MED ORDER — LACTATED RINGERS IV SOLN
INTRAVENOUS | Status: DC
  Administered 2016-02-04: 22:00:00 via INTRAVENOUS

## 2016-02-04 MED ORDER — BUPIVACAINE IN DEXTROSE 0.75-8.25 % IT SOLN
INTRATHECAL | Status: DC | PRN
  Administered 2016-02-04: 1.8 mL via INTRATHECAL

## 2016-02-04 MED ORDER — MENTHOL 3 MG MT LOZG: 1.0000 | LOZENGE | OROMUCOSAL | Status: DC | PRN

## 2016-02-04 MED ORDER — PHENYLEPHRINE HCL 10 MG/ML IJ SOLN
10000.0000 ug | INTRAMUSCULAR | Status: DC | PRN
  Administered 2016-02-04: 60 ug/min via INTRAVENOUS

## 2016-02-04 MED ORDER — LIDOCAINE 5 % EX PTCH
MEDICATED_PATCH | CUTANEOUS | Status: DC | PRN
  Administered 2016-02-04: 1 via TRANSDERMAL

## 2016-02-04 MED ORDER — LIDOCAINE 5 % EX PTCH
1.0000 | MEDICATED_PATCH | CUTANEOUS | Status: DC
  Administered 2016-02-05 – 2016-02-06 (×2): 1 via TRANSDERMAL
  Filled 2016-02-04 (×2): qty 1

## 2016-02-04 MED ORDER — CITRIC ACID-SODIUM CITRATE 334-500 MG/5ML PO SOLN
ORAL | Status: AC
  Administered 2016-02-04: 08:00:00
  Filled 2016-02-04: qty 15

## 2016-02-04 MED ORDER — CLINDAMYCIN PHOSPHATE 900 MG/50ML IV SOLN
900.0000 mg | INTRAVENOUS | Status: AC
  Administered 2016-02-04: 900 mg via INTRAVENOUS
  Filled 2016-02-04: qty 50

## 2016-02-04 MED ORDER — OXYTOCIN 40 UNITS IN LACTATED RINGERS INFUSION - SIMPLE MED
INTRAVENOUS | Status: DC | PRN
  Administered 2016-02-04: 200 mL via INTRAVENOUS

## 2016-02-04 MED ORDER — FENTANYL CITRATE (PF) 100 MCG/2ML IJ SOLN: 25.0000 ug | INTRAMUSCULAR | Status: DC | PRN

## 2016-02-04 MED ORDER — GLYCOPYRROLATE 0.2 MG/ML IJ SOLN
INTRAMUSCULAR | Status: DC | PRN
  Administered 2016-02-04: 0.2 mg via INTRAVENOUS

## 2016-02-04 MED ORDER — SENNOSIDES-DOCUSATE SODIUM 8.6-50 MG PO TABS
2.0000 | ORAL_TABLET | ORAL | Status: DC
  Administered 2016-02-04 – 2016-02-05 (×2): 2 via ORAL
  Filled 2016-02-04 (×2): qty 2

## 2016-02-04 MED ORDER — NALBUPHINE HCL 10 MG/ML IJ SOLN: 5.0000 mg | INTRAMUSCULAR | Status: DC | PRN

## 2016-02-04 MED ORDER — OXYTOCIN 10 UNIT/ML IJ SOLN
40.0000 [IU] | INTRAMUSCULAR | Status: DC | PRN
  Administered 2016-02-04: 10 [IU] via INTRAVENOUS

## 2016-02-04 MED ORDER — MAGNESIUM HYDROXIDE 400 MG/5ML PO SUSP: 30.0000 mL | ORAL | Status: DC | PRN

## 2016-02-04 MED ORDER — SIMETHICONE 80 MG PO CHEW
80.0000 mg | CHEWABLE_TABLET | ORAL | Status: DC | PRN
Start: 2016-02-04 — End: 2016-02-06

## 2016-02-04 MED ORDER — LACTATED RINGERS IV SOLN
INTRAVENOUS | Status: DC
  Administered 2016-02-04: 06:00:00 via INTRAVENOUS

## 2016-02-04 MED ORDER — NALOXONE HCL 2 MG/2ML IJ SOSY
1.0000 ug/kg/h | PREFILLED_SYRINGE | INTRAVENOUS | Status: DC | PRN
  Filled 2016-02-04: qty 2

## 2016-02-04 MED ORDER — NALBUPHINE HCL 10 MG/ML IJ SOLN: 5.0000 mg | Freq: Once | INTRAMUSCULAR | Status: DC | PRN

## 2016-02-04 MED ORDER — LANOLIN HYDROUS EX OINT: 1.0000 "application " | TOPICAL_OINTMENT | CUTANEOUS | Status: DC | PRN

## 2016-02-04 MED ORDER — FENTANYL CITRATE (PF) 100 MCG/2ML IJ SOLN
50.0000 ug | Freq: Once | INTRAMUSCULAR | Status: AC
Start: 2016-02-04 — End: 2016-02-04
  Administered 2016-02-04: 50 ug via INTRAVENOUS

## 2016-02-04 MED ORDER — DIPHENHYDRAMINE HCL 25 MG PO CAPS: 25.0000 mg | ORAL_CAPSULE | Freq: Four times a day (QID) | ORAL | Status: DC | PRN

## 2016-02-04 MED ORDER — WITCH HAZEL-GLYCERIN EX PADS
1.0000 "application " | MEDICATED_PAD | CUTANEOUS | Status: DC | PRN
  Administered 2016-02-05: 1 via TOPICAL
  Filled 2016-02-04: qty 100

## 2016-02-04 MED ORDER — IBUPROFEN 600 MG PO TABS
600.0000 mg | ORAL_TABLET | Freq: Four times a day (QID) | ORAL | Status: DC
  Administered 2016-02-04 (×2): 600 mg via ORAL
  Filled 2016-02-04 (×2): qty 1

## 2016-02-04 MED ORDER — FENTANYL CITRATE (PF) 100 MCG/2ML IJ SOLN
INTRAMUSCULAR | Status: AC
  Administered 2016-02-04: 50 ug via INTRAVENOUS
  Filled 2016-02-04: qty 2

## 2016-02-04 MED ORDER — OXYTOCIN 10 UNIT/ML IJ SOLN
2.5000 [IU]/h | INTRAVENOUS | Status: DC
  Administered 2016-02-04: 2.5 [IU]/h via INTRAVENOUS
  Filled 2016-02-04: qty 4

## 2016-02-04 MED ORDER — FERROUS SULFATE 325 (65 FE) MG PO TABS
325.0000 mg | ORAL_TABLET | Freq: Two times a day (BID) | ORAL | Status: DC
  Administered 2016-02-04 – 2016-02-06 (×4): 325 mg via ORAL
  Filled 2016-02-04 (×4): qty 1

## 2016-02-04 MED ORDER — SCOPOLAMINE 1 MG/3DAYS TD PT72: 1.0000 | MEDICATED_PATCH | Freq: Once | TRANSDERMAL | Status: DC

## 2016-02-04 MED ORDER — IBUPROFEN 600 MG PO TABS
600.0000 mg | ORAL_TABLET | Freq: Four times a day (QID) | ORAL | Status: DC | PRN
  Administered 2016-02-04 – 2016-02-06 (×5): 600 mg via ORAL
  Filled 2016-02-04 (×5): qty 1

## 2016-02-04 MED ORDER — MEPERIDINE HCL 25 MG/ML IJ SOLN: 6.2500 mg | INTRAMUSCULAR | Status: DC | PRN

## 2016-02-04 MED ORDER — NALOXONE HCL 0.4 MG/ML IJ SOLN: 0.4000 mg | INTRAMUSCULAR | Status: DC | PRN

## 2016-02-04 MED ORDER — EPHEDRINE SULFATE 50 MG/ML IJ SOLN
INTRAMUSCULAR | Status: DC | PRN
  Administered 2016-02-04: 10 mg via INTRAVENOUS

## 2016-02-04 MED ORDER — DIPHENHYDRAMINE HCL 50 MG/ML IJ SOLN
12.5000 mg | INTRAMUSCULAR | Status: DC | PRN
  Administered 2016-02-04: 12.5 mg via INTRAVENOUS
  Filled 2016-02-04: qty 1

## 2016-02-04 MED ORDER — SIMETHICONE 80 MG PO CHEW
80.0000 mg | CHEWABLE_TABLET | ORAL | Status: DC
  Administered 2016-02-04 – 2016-02-05 (×2): 80 mg via ORAL
  Filled 2016-02-04 (×2): qty 1

## 2016-02-04 MED ORDER — CIPROFLOXACIN IN D5W 400 MG/200ML IV SOLN
400.0000 mg | INTRAVENOUS | Status: AC
  Administered 2016-02-04: 400 mg via INTRAVENOUS
  Filled 2016-02-04: qty 200

## 2016-02-04 MED ORDER — LIDOCAINE 5 % EX PTCH
1.0000 | MEDICATED_PATCH | CUTANEOUS | Status: DC
  Filled 2016-02-04: qty 1

## 2016-02-04 MED ORDER — ACETAMINOPHEN 325 MG PO TABS: 650.0000 mg | ORAL_TABLET | ORAL | Status: DC | PRN

## 2016-02-04 MED ORDER — ONDANSETRON HCL 4 MG/2ML IJ SOLN
4.0000 mg | Freq: Three times a day (TID) | INTRAMUSCULAR | Status: DC | PRN
  Administered 2016-02-04: 4 mg via INTRAVENOUS
  Filled 2016-02-04: qty 2

## 2016-02-04 MED ORDER — SODIUM CHLORIDE 0.9% FLUSH: 3.0000 mL | INTRAVENOUS | Status: DC | PRN

## 2016-02-04 MED ORDER — ONDANSETRON HCL 4 MG/2ML IJ SOLN
INTRAMUSCULAR | Status: DC | PRN
  Administered 2016-02-04: 4 mg via INTRAVENOUS

## 2016-02-04 MED ORDER — DIPHENHYDRAMINE HCL 25 MG PO CAPS
25.0000 mg | ORAL_CAPSULE | ORAL | Status: DC | PRN
  Administered 2016-02-04: 25 mg via ORAL
  Filled 2016-02-04: qty 1

## 2016-02-04 MED ORDER — ZOLPIDEM TARTRATE 5 MG PO TABS: 5.0000 mg | ORAL_TABLET | Freq: Every evening | ORAL | Status: DC | PRN

## 2016-02-04 MED ORDER — OXYCODONE-ACETAMINOPHEN 5-325 MG PO TABS
2.0000 | ORAL_TABLET | ORAL | Status: DC | PRN
Start: 2016-02-04 — End: 2016-02-06
  Administered 2016-02-04 – 2016-02-06 (×9): 2 via ORAL
  Filled 2016-02-04 (×9): qty 2

## 2016-02-04 MED ORDER — ONDANSETRON HCL 4 MG/2ML IJ SOLN: 4.0000 mg | Freq: Once | INTRAMUSCULAR | Status: DC | PRN

## 2016-02-04 SURGICAL SUPPLY — 25 items
BAG COUNTER SPONGE EZ (MISCELLANEOUS) ×3 IMPLANT
CANISTER SUCT 3000ML (MISCELLANEOUS) ×3 IMPLANT
CHLORAPREP W/TINT 26ML (MISCELLANEOUS) ×6 IMPLANT
DRSG TELFA 3X8 NADH (GAUZE/BANDAGES/DRESSINGS) ×3 IMPLANT
ELECT REM PT RETURN 9FT ADLT (ELECTROSURGICAL) ×3
ELECTRODE REM PT RTRN 9FT ADLT (ELECTROSURGICAL) ×2 IMPLANT
GAUZE SPONGE 4X4 12PLY STRL (GAUZE/BANDAGES/DRESSINGS) ×3 IMPLANT
GLOVE BIO SURGEON STRL SZ 6 (GLOVE) ×3 IMPLANT
GLOVE BIOGEL PI IND STRL 6.5 (GLOVE) ×2 IMPLANT
GLOVE BIOGEL PI INDICATOR 6.5 (GLOVE) ×1
GOWN STRL REUS W/ TWL LRG LVL3 (GOWN DISPOSABLE) ×4 IMPLANT
GOWN STRL REUS W/TWL LRG LVL3 (GOWN DISPOSABLE) ×2
KIT RM TURNOVER STRD PROC AR (KITS) ×3 IMPLANT
MARKER SKIN DUAL TIP RULER LAB (MISCELLANEOUS) ×3 IMPLANT
NS IRRIG 1000ML POUR BTL (IV SOLUTION) ×3 IMPLANT
PACK C SECTION AR (MISCELLANEOUS) ×3 IMPLANT
PAD OB MATERNITY 4.3X12.25 (PERSONAL CARE ITEMS) ×3 IMPLANT
PAD PREP 24X41 OB/GYN DISP (PERSONAL CARE ITEMS) ×3 IMPLANT
SUT CHROMIC 0 CT 1 (SUTURE) ×6 IMPLANT
SUT MNCRL AB 4-0 PS2 18 (SUTURE) ×3 IMPLANT
SUT PLAIN 2 0 XLH (SUTURE) IMPLANT
SUT VIC AB 0 CT1 36 (SUTURE) ×12 IMPLANT
SUT VIC AB 1 CT1 36 (SUTURE) IMPLANT
SUT VIC AB 3-0 SH 27 (SUTURE) ×1
SUT VIC AB 3-0 SH 27X BRD (SUTURE) ×2 IMPLANT

## 2016-02-04 NOTE — Anesthesia Preprocedure Evaluation (Signed)
Anesthesia Evaluation  Patient identified by MRN, date of birth, ID band Patient awake    Reviewed: Allergy & Precautions, NPO status , Patient's Chart, lab work & pertinent test results  Airway Mallampati: II       Dental no notable dental hx.    Pulmonary neg pulmonary ROS, former smoker,    Pulmonary exam normal        Cardiovascular Exercise Tolerance: Good  Rhythm:Regular     Neuro/Psych    GI/Hepatic negative GI ROS, Neg liver ROS,   Endo/Other  negative endocrine ROS  Renal/GU negative Renal ROS     Musculoskeletal   Abdominal Normal abdominal exam  (+)   Peds negative pediatric ROS (+)  Hematology negative hematology ROS (+)   Anesthesia Other Findings   Reproductive/Obstetrics                             Anesthesia Physical Anesthesia Plan  ASA: II  Anesthesia Plan: Spinal   Post-op Pain Management:    Induction:   Airway Management Planned: Natural Airway and Nasal Cannula  Additional Equipment:   Intra-op Plan:   Post-operative Plan:   Informed Consent: I have reviewed the patients History and Physical, chart, labs and discussed the procedure including the risks, benefits and alternatives for the proposed anesthesia with the patient or authorized representative who has indicated his/her understanding and acceptance.     Plan Discussed with: CRNA  Anesthesia Plan Comments:         Anesthesia Quick Evaluation

## 2016-02-04 NOTE — Op Note (Signed)
Cesarean Section with Bilateral Tubal Ligation Procedure Note  Indications: previous uterine incision low transverse x 1, declines trial of labor after cesarean, multiparous desiring permanent sterility  Pre-operative Diagnosis: 39 week 0 day pregnancy, h/o prior c-section x 1 (declines TOLAC), multiparity desiring permanent sterilization.  Post-operative Diagnosis: Same  Surgeon: Hildred Laser, MD  Assistants: Sharon Seller, MD  Procedure: Repeat low transverse Cesarean Section with bilateral tubal ligation  Anesthesia: Spinal anesthesia  ASA Class: II  Procedure Details: The patient was seen in the Holding Room. The risks, benefits, complications, treatment options, and expected outcomes were discussed with the patient.  The patient concurred with the proposed plan, giving informed consent.  The site of surgery properly noted/marked. The patient was taken to the Operating Room, identified as Vicki Cruz and the procedure verified as C-Section Delivery. A Time Out was held and the above information confirmed.  After induction of anesthesia, the patient was draped and prepped in the usual sterile manner. Anesthesia was tested and noted to be adequate. A Pfannenstiel incision was made and carried down through the subcutaneous tissue to the fascia. Fascial incision was made and extended transversely. The fascia was separated from the underlying rectus tissue superiorly and inferiorly. The peritoneum was identified and entered. Peritoneal incision was extended longitudinally. A thin uterine window was noted at the lower uterine segment and so a bladder flap was not created.  A low transverse uterine incision was made. Delivered from cephalic presentation was a 3713 gram Female with Apgar scores of 8 at one minute and 9 at five minutes.After the umbilical cord was clamped and cut cord blood was obtained for evaluation. The placenta was removed intact and appeared normal. The uterus was  exteriorized and cleared of all clots and debris. The uterine outline, tubes and ovaries appeared normal.  The uterine incision was closed with a running locked suture of 0-Vicryl.   Hemostasis was observed.  Attention was then turned to the fallopian tubes, and where the patient's right fallopian tube was identified and grasped with a Babcock clamp.  The tube was then followed out to the vimbria.  The Babcock clamp was then used to grasp the tube approximately 4 cm from the cornual region.  A 3 cm segment of tube was then ligated with a free tie of 0-Chromic using the [place] method and excised.  The left fallopian tube was then ligated in a similar fashion and excised. The tubal lumens were cauterized bilaterally.  Good hemostasis was noted with bilateral fallopian tubes. The uterus was then returned to the abdomen.   Lavage was carried out until clear. The fascia was then reapproximated with a running suture of 0-Vicryl. The skin was reapproximated with 4-0 Monocryl.    Instrument, sponge, and needle counts were correct prior the abdominal closure and at the conclusion of the case.   Findings: Female infant in cephalic presentation, 3713 grams, with Apgar scores of 8 at one minute and 9 at five minutes. Intact placenta with 3 vessel cord.  The uterine outline, tubes and ovaries appeared normal.   Estimated Blood Loss:  500 ml      Drains: foley catheter to gravity drainage, 25 ml of clear urine at end of the procedure         Total IV Fluids:  1100 ml  Specimens: Placenta and segments of bilateral fallopian tubes. Disposition:  Sent to Pathology         Implants: None  Complications:  None; patient tolerated the procedure well.         Disposition: PACU - hemodynamically stable.         Condition: stable   Hildred Laser, MD [facility]

## 2016-02-04 NOTE — Lactation Note (Signed)
This note was copied from a baby's chart. Lactation Consultation Note Vicki Cruz has a tight lingual frenulum with tongue curling under when attempts to extend over the lower gum and slight indentation when pulls tongue upward or cries vigorously.  Outwardly, Vicki Cruz appears to be breast feeding well with mom verbalizing strong tug at the breast without nipple pain.  Mom has small nipples and they are not pinched after he comes off the breast.  Towards end of breast feeding, he was slipping back to shallow latch and dimpling was noted.  This is her third pregnancy with one loss and one other living son.  Mom is very anxious about everything to do with her care and or Vicki Cruz's care even though this is her second baby.  She was unsuccessful with breast feeding her other son reporting difficulties with latching.  Discussed tight frenulum with mom and encouraged her to discuss with Pediatrician.  Since mom was getting so upset when talking about frenulum, LC did not go into further detail.  The grandmother of baby in room was reassuring mom that she had to have her frenulum clipped and was fine.   Patient Name: Vicki Cruz Today's Date: 02/04/2016 Reason for consult: Initial assessment   Maternal Data Formula Feeding for Exclusion: No Has patient been taught Hand Expression?: Yes Does the patient have breastfeeding experience prior to this delivery?: Yes  Feeding Feeding Type: Breast Fed Length of feed: 15 min  LATCH Score/Interventions Latch: Repeated attempts needed to sustain latch, nipple held in mouth throughout feeding, stimulation needed to elicit sucking reflex. Intervention(s): Adjust position;Assist with latch;Breast massage;Breast compression  Audible Swallowing: A few with stimulation Intervention(s): Hand expression  Type of Nipple: Everted at rest and after stimulation  Comfort (Breast/Nipple): Soft / non-tender     Hold (Positioning): Assistance needed to correctly position  infant at breast and maintain latch. Intervention(s): Breastfeeding basics reviewed;Support Pillows;Position options  LATCH Score: 7  Lactation Tools Discussed/Used WIC Program: No Information systems manager)   Consult Status Consult Status: Follow-up Date: 02/04/16 Follow-up type: In-patient    Louis Meckel 02/04/2016, 1:30 PM

## 2016-02-04 NOTE — Anesthesia Procedure Notes (Signed)
Spinal Patient location during procedure: OR Start time: 02/04/2016 7:30 AM End time: 02/04/2016 7:40 AM Reason for block: at surgeon's request Staffing Anesthesiologist: Elijio Miles F Performed by: anesthesiologist  Preanesthetic Checklist Completed: patient identified, site marked, surgical consent, pre-op evaluation, timeout performed, IV checked, risks and benefits discussed, monitors and equipment checked and at surgeon's request Spinal Block Patient position: sitting Prep: Betadine Approach: midline Location: L3-4 Injection technique: single-shot Needle Needle type: Quincke  Needle gauge: 25 G Needle length: 9 cm Needle insertion depth: 6 cm Assessment Sensory level: T6

## 2016-02-04 NOTE — H&P (Signed)
Obstetric Preoperative History and Physical  Vicki Cruz is a 25 y.o. G3P1011 with IUP at [redacted]w[redacted]d presenting for presenting for scheduled repeat cesarean section for h/o prior C-section x 1, declining TOLAC.  No acute concerns.   Prenatal Course Source of Care: [facility] with onset of care at 12 weeks Pregnancy complications or risks: Patient Active Problem List   Diagnosis Date Noted  . H/O cesarean section complicating pregnancy 02/04/2016  . Labor and delivery, indication for care 02/01/2016  . Pregnancy related back pain in third trimester, antepartum 01/27/2016  . Malaise 10/19/2015  . Back pain affecting pregnancy in second trimester 09/24/2015  . Thrombocytopenia affecting pregnancy, antepartum (HCC) 09/24/2015  . Tobacco abuse 06/11/2015  . H/O miscarriage, currently pregnant 06/11/2015   She plans to breastfeed She desires bilateral tubal ligation for postpartum contraception.   Prenatal labs and studies: ABO, Rh: --/--/O POS (02/17 1204) Antibody: NEG (02/17 1203) Rubella: <20.0 (08/17 0847) RPR: Non Reactive (02/17 1203)  HBsAg: Negative (08/17 0847)  HIV: Non Reactive (08/17 0847)  GBS: Negative (01/24  0430) 1 hr Glucola normal - 114 Genetic screening normal Anatomy US normal  Prenatal Transfer Tool  Maternal Diabetes: No Genetic Screening: Normal Maternal Ultrasounds/Referrals: Normal Fetal Ultrasounds or other Referrals:  None Maternal Substance Abuse:  No Significant Maternal Medications:  None Significant Maternal Lab Results: Lab values include: Group B Strep negative   OB History  Gravida Para Term Preterm AB SAB TAB Ectopic Multiple Living  3 1 1  1 1    1     # Outcome Date GA Lbr Len/2nd Weight Sex Delivery Anes PTL Lv  3 Current           2 SAB 04/10/15     SAB   FD  1 Term 2008 [redacted]w[redacted]d  7 lb 2 oz (3.232 kg) M CS-LTranv Spinal  Y     Complications: Failure to Progress in First Stage    Obstetric Comments  Failed IOL at 42  weeks    Past Medical History  Diagnosis Date  . Tobacco abuse     quit: 06/06/2015  . History of miscarriage   . Preterm labor     1st pregnancy had to get medicine at 21 weeks because of labor    Past Surgical History  Procedure Laterality Date  . Cesarean section  2008  . Iud removal      Family History  Problem Relation Age of Onset  . Cancer Paternal Grandfather     unsure of what type    Social History   Social History  . Marital Status: Married    Spouse Name: N/A  . Number of Children: 1  . Years of Education: N/A   Occupational History  . sales    Social History Main Topics  . Smoking status: Former Smoker    Quit date: 05/16/2015  . Smokeless tobacco: Never Used  . Alcohol Use: No  . Drug Use: No  . Sexual Activity:    Partners: Male   Other Topics Concern  . None   Social History Narrative    Prescriptions prior to admission  Medication Sig Dispense Refill Last Dose  . cyclobenzaprine (FLEXERIL) 10 MG tablet Take 1 tablet (10 mg total) by mouth every 8 (eight) hours as needed for muscle spasms. 30 tablet 1 Taking    Allergies  Allergen Reactions  . Penicillins Nausea And Vomiting, Rash and Other (See Comments)    Reaction:  Fever  Has  patient had a PCN reaction causing immediate rash, facial/tongue/throat swelling, SOB or lightheadedness with hypotension: No Has patient had a PCN reaction causing severe rash involving mucus membranes or skin necrosis: No Has patient had a PCN reaction that required hospitalization No Has patient had a PCN reaction occurring within the last 10 years: No If all of the above answers are "NO", then may proceed with Cephalosporin use.    Review of Systems: Negative except for what is mentioned in HPI.  Physical Exam: BP 121/76 mmHg  Pulse 111  Temp(Src) 98.7 F (37.1 C) (Oral)  Resp 16  LMP  (LMP Unknown) FHR by Doppler: 150 bpm GENERAL: Well-developed, well-nourished female in no acute distress.   LUNGS: Clear to auscultation bilaterally.  HEART: Regular rate and rhythm. ABDOMEN: Soft, nontender, nondistended, gravid, well-healed Pfannenstiel incision. PELVIC: Deferred EXTREMITIES: Nontender, no edema, 2+ distal pulses.   Pertinent Labs/Studies:   Results for orders placed or performed during the hospital encounter of 02/01/16 (from the past 72 hour(s))  CBC     Status: Abnormal   Collection Time: 02/01/16 12:03 PM  Result Value Ref Range   WBC 10.8 3.6 - 11.0 K/uL   RBC 4.20 3.80 - 5.20 MIL/uL   Hemoglobin 10.1 (L) 12.0 - 16.0 g/dL   HCT 16.1 (L) 09.6 - 04.5 %   MCV 74.0 (L) 80.0 - 100.0 fL   MCH 24.0 (L) 26.0 - 34.0 pg   MCHC 32.4 32.0 - 36.0 g/dL   RDW 40.9 (H) 81.1 - 91.4 %   Platelets 123 (L) 150 - 440 K/uL  Rapid HIV screen (HIV 1/2 Ab+Ag)     Status: None   Collection Time: 02/01/16 12:03 PM  Result Value Ref Range   HIV-1 P24 Antigen - HIV24 NON REACTIVE NON REACTIVE   HIV 1/2 Antibodies NON REACTIVE NON REACTIVE   Interpretation (HIV Ag Ab)      A non reactive test result means that HIV 1 or HIV 2 antibodies and HIV 1 p24 antigen were not detected in the specimen.  RPR     Status: None   Collection Time: 02/01/16 12:03 PM  Result Value Ref Range   RPR Ser Ql Non Reactive Non Reactive    Comment: (NOTE) Performed At: [facility] [address] [place], [place] [id number] Mila Homer MD [id number]   Type and screen     Status: None   Collection Time: 02/01/16 12:03 PM  Result Value Ref Range   ABO/RH(D) O POS    Antibody Screen NEG    Sample Expiration 02/04/2016    Extend sample reason PREGNANT WITHIN 3 MONTHS, UNABLE TO EXTEND   ABO/Rh     Status: None   Collection Time: 02/01/16 12:04 PM  Result Value Ref Range   ABO/RH(D) O POS   Culture, OB Urine     Status: None   Collection Time: 02/01/16 12:15 PM  Result Value Ref Range   Specimen Description URINE, RANDOM    Special Requests Normal    Culture MULTIPLE SPECIES PRESENT,  SUGGEST RECOLLECTION    Report Status 02/02/2016 FINAL     Assessment and Plan :Vicki Cruz is a 25 y.o. G3P1011 at [redacted]w[redacted]d being admitted  for scheduled cesarean section delivery . The patient is understanding of the planned procedure and is aware of and accepting of all surgical risks, including but not limited to: bleeding which may require transfusion or reoperation; infection which may require antibiotics; injury to bowel, bladder, ureters or other  surrounding organs which may require repair; injury to the fetus; need for additional procedures including hysterectomy in the event of life-threatening complications; placental abnormalities wth subsequent pregnancies; incisional problems; blood clot disorders which may require blood thinners;, and other postoperative/anesthesia complications. The patient is in agreement with the proposed plan, and gives informed written consent for the procedure. All questions have been answered.  Patient desires permanent sterilization.  Other reversible forms of contraception have been previously discussed with patient; she declines all other modalities. Risks of procedure discussed with patient including but not limited to: risk of regret, permanence of method, bleeding, infection, injury to surrounding organs and need for additional procedures.  Failure risk of 1-2 % with increased risk of ectopic gestation if pregnancy occurs was also discussed with patient.  Patient verbalized understanding of these risks and wants to proceed with sterilization.  Written informed consent obtained.  To OR when ready.   Hildred Laser, MD [facility]

## 2016-02-04 NOTE — Transfer of Care (Signed)
Immediate Anesthesia Transfer of Care Note  Patient: Vicki Cruz  Procedure(s) Performed: Procedure(s): REPEAT CESAREAN SECTION WITH BILATERAL TUBAL LIGATION (Bilateral)  Patient Location: PACU  Anesthesia Type:Spinal  Level of Consciousness: awake, alert , oriented and patient cooperative  Airway & Oxygen Therapy: Patient Spontanous Breathing  Post-op Assessment: Report given to RN and Post -op Vital signs reviewed and stable  Post vital signs: Reviewed and stable  Last Vitals:  Filed Vitals:   02/04/16 0616 02/04/16 0707  BP:  121/76  Pulse:  111  Temp: 37.1 C   Resp: 16     Complications: No apparent anesthesia complications   See v/s record for current vitals. Pt stable in post op care

## 2016-02-05 ENCOUNTER — Encounter: Payer: Managed Care, Other (non HMO) | Admitting: Obstetrics and Gynecology

## 2016-02-05 LAB — CBC
HCT: 23.5 % — ABNORMAL LOW (ref 35.0–47.0)
HEMOGLOBIN: 7.5 g/dL — AB (ref 12.0–16.0)
MCH: 23.3 pg — AB (ref 26.0–34.0)
MCHC: 31.9 g/dL — AB (ref 32.0–36.0)
MCV: 73 fL — AB (ref 80.0–100.0)
Platelets: 110 10*3/uL — ABNORMAL LOW (ref 150–440)
RBC: 3.22 MIL/uL — AB (ref 3.80–5.20)
RDW: 17.2 % — ABNORMAL HIGH (ref 11.5–14.5)
WBC: 10.5 10*3/uL (ref 3.6–11.0)

## 2016-02-05 LAB — SURGICAL PATHOLOGY

## 2016-02-05 MED ORDER — HYDROCORTISONE ACE-PRAMOXINE 1-1 % RE FOAM
1.0000 | Freq: Two times a day (BID) | RECTAL | Status: DC
  Administered 2016-02-05 – 2016-02-06 (×3): 1 via RECTAL
  Filled 2016-02-05: qty 10

## 2016-02-05 NOTE — Anesthesia Postprocedure Evaluation (Signed)
Anesthesia Post Note  Patient: Vicki Cruz  Procedure(s) Performed: Procedure(s) (LRB): REPEAT CESAREAN SECTION WITH BILATERAL TUBAL LIGATION (Bilateral)  Patient location during evaluation: Mother Baby Anesthesia Type: Spinal Level of consciousness: awake, awake and alert and oriented Pain management: pain level controlled Vital Signs Assessment: post-procedure vital signs reviewed and stable Respiratory status: spontaneous breathing, nonlabored ventilation and respiratory function stable Cardiovascular status: blood pressure returned to baseline and stable Postop Assessment: no headache and no backache Anesthetic complications: no    Last Vitals:  Filed Vitals:   02/04/16 2305 02/05/16 0345  BP: 115/64 94/50  Pulse: 81 72  Temp: 36.7 C 36.7 C  Resp: 18 18    Last Pain:  Filed Vitals:   02/05/16 0524  PainSc: 2                  Charnel Giles,  Sheran Fava

## 2016-02-05 NOTE — Progress Notes (Addendum)
Postpartum Day 1: Cesarean Delivery  Subjective: Patient denies nausea, vomiting and + BM.  Reports no problems voiding and tolerating regular diet. Complains of hemorrhoidal pain not controlled with current meds.   Objective: Vital signs in last 24 hours: Temp:  [98 F (36.7 C)-98.5 F (36.9 C)] 98.4 F (36.9 C) (02/21 1210) Pulse Rate:  [72-85] 84 (02/21 1210) Resp:  [16-18] 16 (02/21 1210) BP: (94-115)/(50-70) 106/64 mmHg (02/21 1210) SpO2:  [99 %-100 %] 100 % (02/21 1210) Weight:  [169 lb 8 oz (76.885 kg)] 169 lb 8 oz (76.885 kg) (02/20 1941)  Physical Exam:  General: alert and no distress  Lungs: clear to auscultation bilaterally Breasts: normal appearance, no masses or tenderness Heart: regular rate and rhythm, S1, S2 normal, no murmur, click, rub or gallop Lochia: appropriate Uterine Fundus: firm Incision: healing well, no significant drainage, no dehiscence, no significant erythema DVT Evaluation: No evidence of DVT seen on physical exam.  Negative Homan's sign.  No cords or calf tenderness. No significant calf/ankle edema.  CBC Latest Ref Rng 02/05/2016 02/01/2016 11/27/2015  WBC 3.6 - 11.0 K/uL 10.5 10.8 -  Hemoglobin 12.0 - 16.0 g/dL 7.5(L) 10.1(L) -  Hematocrit 35.0 - 47.0 % 23.5(L) 31.1(L) 33.3(L)  Platelets 150 - 440 K/uL 110(L) 123(L) -    Assessment/Plan: Status post Cesarean section. Doing well postoperatively.  Hemorrhoid pain.  Will prescribe Proctofoam.  Continue current care.  Anemia - moderate.  Asymptomatic. Will treat with PO iron.    Hildred Laser 02/05/2016, 2:11 PM

## 2016-02-05 NOTE — Anesthesia Post-op Follow-up Note (Signed)
  Anesthesia Pain Follow-up Note  Patient: Vicki Cruz  Day #: 1  Date of Follow-up: 02/05/2016 Time: 7:34 AM  Last Vitals:  Filed Vitals:   02/04/16 2305 02/05/16 0345  BP: 115/64 94/50  Pulse: 81 72  Temp: 36.7 C 36.7 C  Resp: 18 18    Level of Consciousness: alert  Pain: mild   Side Effects:None  Catheter Site Exam:clean, dry, no drainage  Plan: Continue current therapy  Ashika Apuzzo,  Sheran Fava

## 2016-02-06 MED ORDER — DOCUSATE SODIUM 100 MG PO CAPS: 100.0000 mg | ORAL_CAPSULE | Freq: Two times a day (BID) | ORAL | Status: DC | PRN

## 2016-02-06 MED ORDER — IBUPROFEN 800 MG PO TABS: 800.0000 mg | ORAL_TABLET | Freq: Three times a day (TID) | ORAL | Status: DC | PRN

## 2016-02-06 MED ORDER — OXYCODONE-ACETAMINOPHEN 5-325 MG PO TABS: 1.0000 | ORAL_TABLET | Freq: Four times a day (QID) | ORAL | Status: DC | PRN

## 2016-02-06 MED ORDER — PRENATAL MULTIVITAMIN CH: 1.0000 | ORAL_TABLET | Freq: Every day | ORAL | Status: DC

## 2016-02-06 MED ORDER — FERROUS SULFATE 325 (65 FE) MG PO TABS
325.0000 mg | ORAL_TABLET | Freq: Three times a day (TID) | ORAL | Status: DC
Start: 2016-02-06 — End: 2016-03-11

## 2016-02-06 NOTE — Progress Notes (Signed)
Pt discharged home with infant.  Discharge instructions and follow up appointment given to and reviewed with pt.  Pt verbalized understanding.  Escorted by auxillary. 

## 2016-02-06 NOTE — Discharge Instructions (Signed)
Cesarean Delivery, Care After  Refer to this sheet in the next few weeks. These instructions provide you with information on caring for yourself after your procedure. Your health care provider may also give you specific instructions. Your treatment has been planned according to current medical practices, but problems sometimes occur. Call your health care provider if you have any problems or questions after you go home.  HOME CARE INSTRUCTIONS   Only take over-the-counter or prescription medications as directed by your health care provider.   Do not drink alcohol, especially if you are breastfeeding or taking medication to relieve pain.   Do not chew or smoke tobacco.   Continue to use good perineal care. Good perineal care includes:    Wiping your perineum from front to back.    Keeping your perineum clean.   Check your surgical cut (incision) daily for increased redness, drainage, swelling, or separation of skin.   Clean your incision gently with soap and water every day, and then pat it dry. If your health care provider says it is okay, leave the incision uncovered. Use a bandage (dressing) if the incision is draining fluid or appears irritated. If the adhesive strips across the incision do not fall off within 7 days, carefully peel them off.   Hug a pillow when coughing or sneezing until your incision is healed. This helps to relieve pain.   Do not use tampons or douche until your health care provider says it is okay.   Shower, wash your hair, and take tub baths as directed by your health care provider.   Wear a well-fitting bra that provides breast support.   Limit wearing support panties or control-top hose.   Drink enough fluids to keep your urine clear or pale yellow.   Eat high-fiber foods such as whole grain cereals and breads, brown rice, beans, and fresh fruits and vegetables every day. These foods may help prevent or relieve constipation.   Resume activities such as climbing stairs,  driving, lifting, exercising, or traveling as directed by your health care provider.   Talk to your health care provider about resuming sexual activities. This is dependent upon your risk of infection, your rate of healing, and your comfort and desire to resume sexual activity.   Try to have someone help you with your household activities and your newborn for at least a few days after you leave the hospital.   Rest as much as possible. Try to rest or take a nap when your newborn is sleeping.   Increase your activities gradually.   Keep all of your scheduled postpartum appointments. It is very important to keep your scheduled follow-up appointments. At these appointments, your health care provider will be checking to make sure that you are healing physically and emotionally.  SEEK MEDICAL CARE IF:    You are passing large clots from your vagina. Save any clots to show your health care provider.   You have a foul smelling discharge from your vagina.   You have trouble urinating.   You are urinating frequently.   You have pain when you urinate.   You have a change in your bowel movements.   You have increasing redness, pain, or swelling near your incision.   You have pus draining from your incision.   Your incision is separating.   You have painful, hard, or reddened breasts.   You have a severe headache.   You have blurred vision or see spots.   You feel sad   or depressed.   You have thoughts of hurting yourself or your newborn.   You have questions about your care, the care of your newborn, or medications.   You are dizzy or light-headed.   You have a rash.   You have pain, redness, or swelling at the site of the removed intravenous access (IV) tube.   You have nausea or vomiting.   You stopped breastfeeding and have not had a menstrual period within 12 weeks of stopping.   You are not breastfeeding and have not had a menstrual period within 12 weeks of delivery.   You have a fever.  SEEK  IMMEDIATE MEDICAL CARE IF:   You have persistent pain.   You have chest pain.   You have shortness of breath.   You faint.   You have leg pain.   You have stomach pain.   Your vaginal bleeding saturates 2 or more sanitary pads in 1 hour.  MAKE SURE YOU:    Understand these instructions.   Will watch your condition.   Will get help right away if you are not doing well or get worse.     This information is not intended to replace advice given to you by your health care provider. Make sure you discuss any questions you have with your health care provider.     Document Released: 08/23/2002 Document Revised: 12/22/2014 Document Reviewed: 07/28/2012  Elsevier Interactive Patient Education 2016 [employer]

## 2016-02-06 NOTE — Discharge Summary (Signed)
Obstetric Discharge Summary Reason for Admission: scheduled repeat cesarean section Prenatal Procedures: ultrasound Intrapartum Procedures: cesarean: low cervical, transverse and tubal ligation Postpartum Procedures: none Complications-Operative and Postpartum: none HEMOGLOBIN  Date Value Ref Range Status  02/05/2016 7.5* 12.0 - 16.0 g/dL Final   HGB  Date Value Ref Range Status  04/06/2015 13.8 12.0-16.0 g/dL Final   HCT  Date Value Ref Range Status  02/05/2016 23.5* 35.0 - 47.0 % Final  04/06/2015 40.6 35.0-47.0 % Final   HEMATOCRIT  Date Value Ref Range Status  11/27/2015 33.3* 34.0 - 46.6 % Final    Physical Exam:  General: alert and no distress Lochia: appropriate Uterine Fundus: firm Incision: healing well DVT Evaluation: Negative Homan's sign. No cords or calf tenderness. No significant calf/ankle edema.  Discharge Diagnoses: Term Pregnancy-delivered, s/p C-section.  Anemia  Discharge Information: Date: 02/06/2016 Activity: pelvic rest Diet: routine Medications: PNV, Ibuprofen, Colace, Iron and Percocet Condition: stable Instructions: refer to practice specific booklet Discharge to: home   Follow-up Information    Follow up with Hildred Laser, MD In 1 week.   Specialties:  Obstetrics and Gynecology, Radiology   Why:  For wound re-check   Contact information:   [address] [place] [address] [phone]       Newborn Data: Live born female  Birth Weight: 8 lb 3.2 oz (3720 g) APGAR: 8, 9  Home with mother.  Hildred Laser 02/06/2016, 8:27 AM

## 2016-02-06 NOTE — Progress Notes (Signed)
Postpartum Day 2: Cesarean Delivery with BTL  Subjective: Patient denies nausea and vomiting.  Reports no problems voiding and tolerating regular diet. Notes hemorrhoidal pain better controlled with Proctofoam.   Objective: Vital signs in last 24 hours: Temp:  [97.9 F (36.6 C)-98.5 F (36.9 C)] 98 F (36.7 C) (02/22 0800) Pulse Rate:  [83-88] 84 (02/22 0800) Resp:  [16-18] 18 (02/22 0800) BP: (106-117)/(64-71) 117/66 mmHg (02/22 0800) SpO2:  [98 %-100 %] 100 % (02/21 1905)  Physical Exam:  General: alert and no distress  Lungs: clear to auscultation bilaterally Breasts: normal appearance, no masses or tenderness Heart: regular rate and rhythm, S1, S2 normal, no murmur, click, rub or gallop Lochia: appropriate Uterine Fundus: firm Incision: healing well, no significant drainage, no dehiscence, no significant erythema DVT Evaluation: No evidence of DVT seen on physical exam.  Negative Homan's sign.  No cords or calf tenderness. No significant calf/ankle edema.  CBC Latest Ref Rng 02/05/2016 02/01/2016 11/27/2015  WBC 3.6 - 11.0 K/uL 10.5 10.8 -  Hemoglobin 12.0 - 16.0 g/dL 7.5(L) 10.1(L) -  Hematocrit 35.0 - 47.0 % 23.5(L) 31.1(L) 33.3(L)  Platelets 150 - 440 K/uL 110(L) 123(L) -    Assessment/Plan: Status post Cesarean section. Doing well postoperatively.  Hemorrhoid pain.  Will prescribe Proctofoam.  Continue current care.  Anemia - moderate (anemia of pregnancy exacerbated by normal surgical blood loss).  Asymptomatic. Will treat with PO iron 3 x daily.  Can d/c home today.  Desires circumcision for female infant.   Hildred Laser 02/06/2016, 8:24 AM

## 2016-02-08 IMAGING — US US RENAL
1 series · 14 of 25 positions shown · non-contrast
Comparison: Abdominal and pelvic CT scan May 30, 2012

CLINICAL DATA: Right CVA pain, the patient is 20 weeks pregnant

EXAM:
RENAL / URINARY TRACT ULTRASOUND COMPLETE

[Series 1: us renal · 0.24mm/px · 14 of 41 slices shown]
[im 1/41]
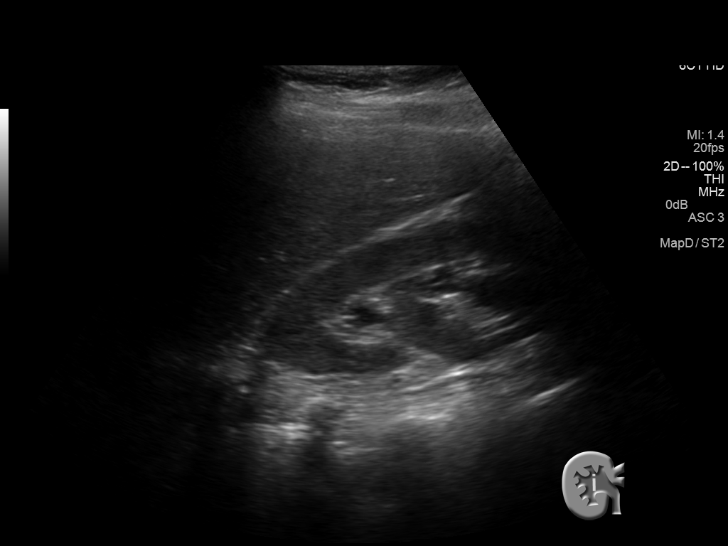
[im 4/41]
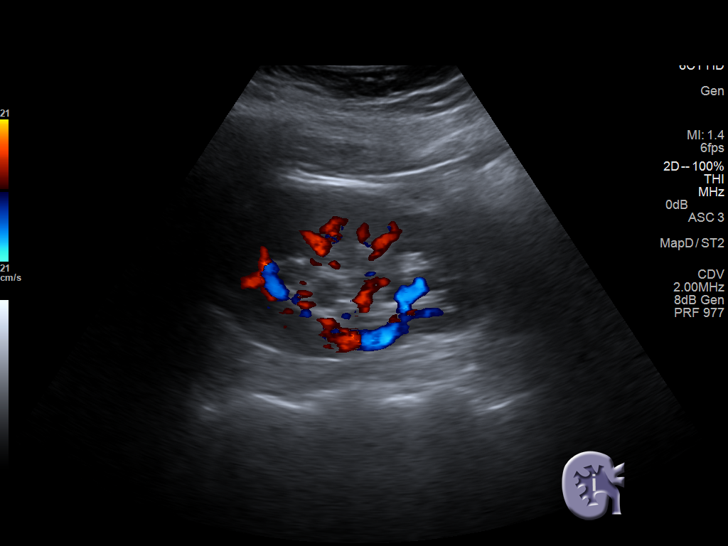
[im 7/41]
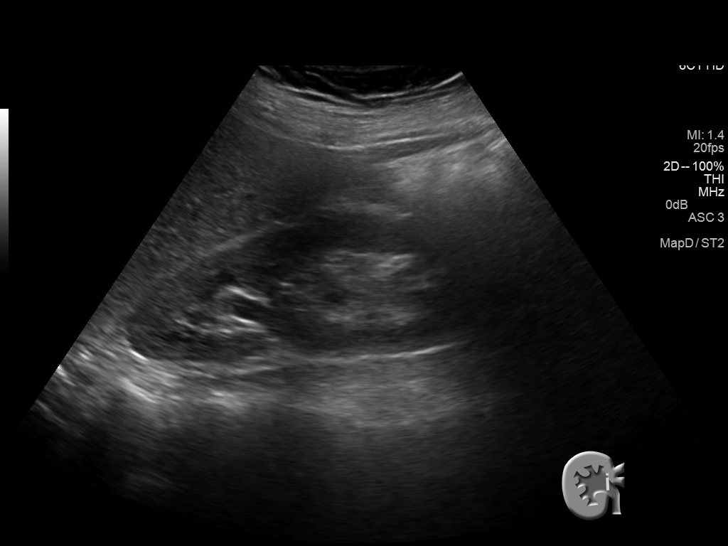
[im 11/41]
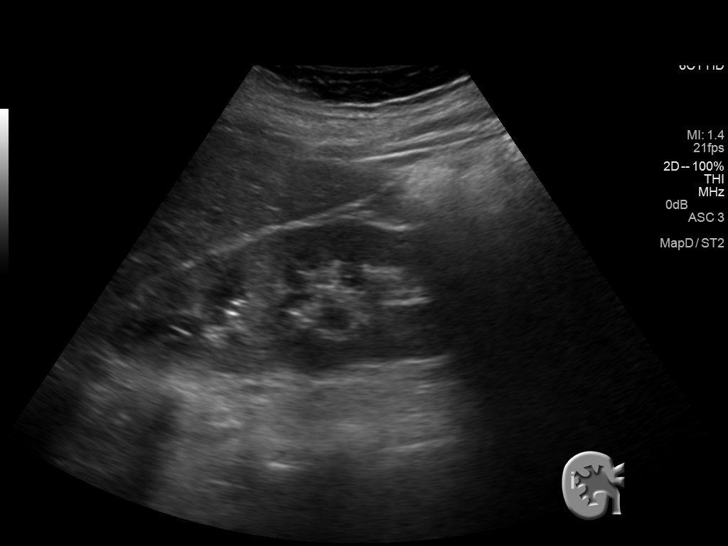
[im 14/41]
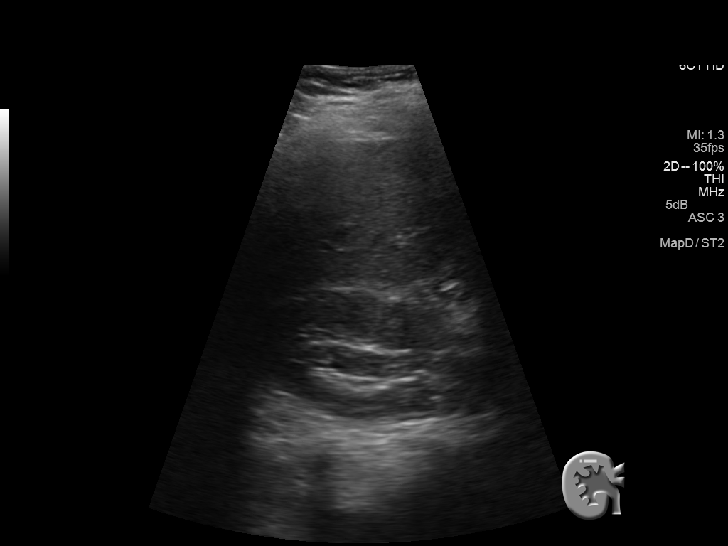
[im 16/41]
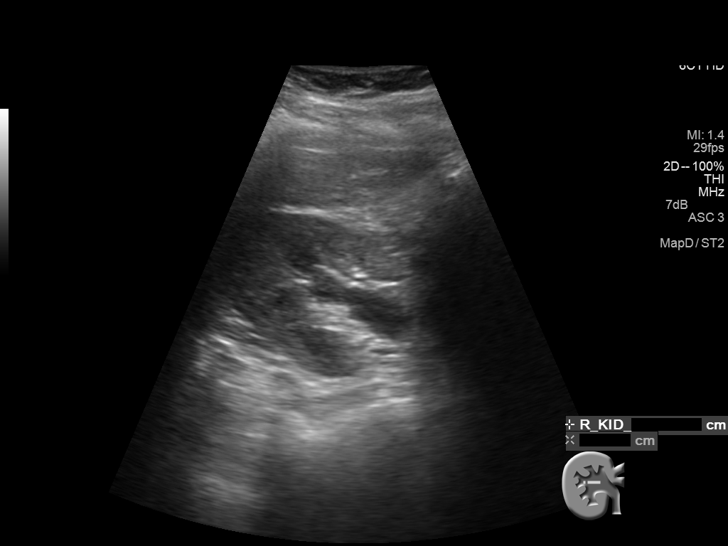
[im 19/41]
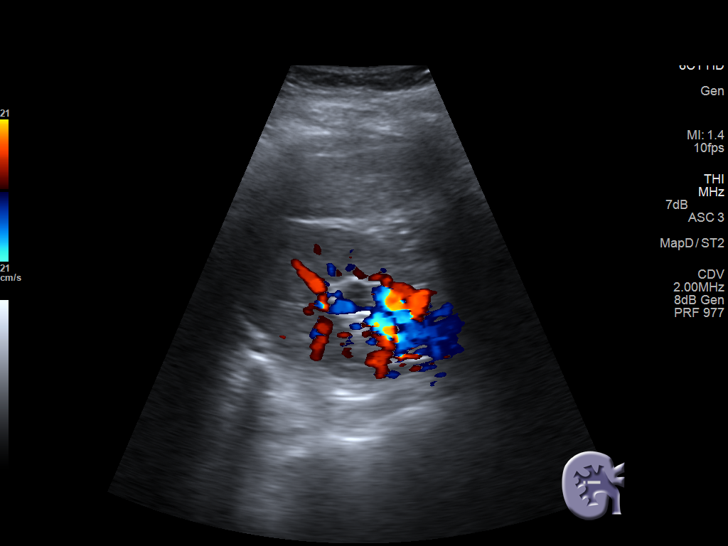
[im 22/41]
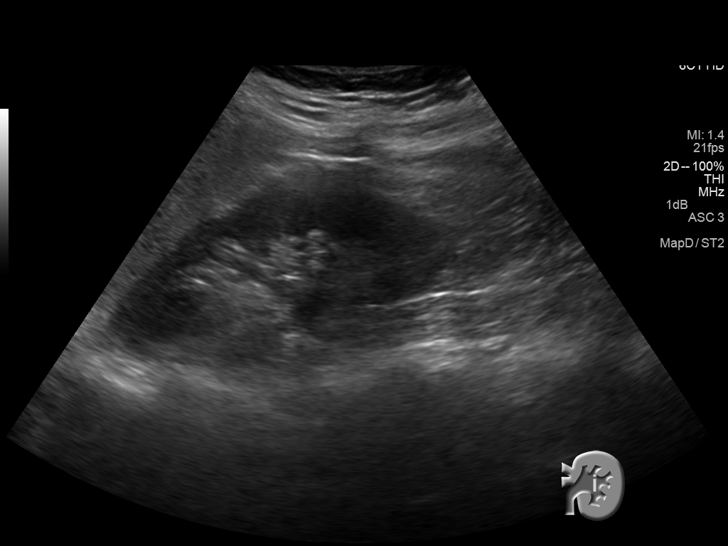
[im 26/41]
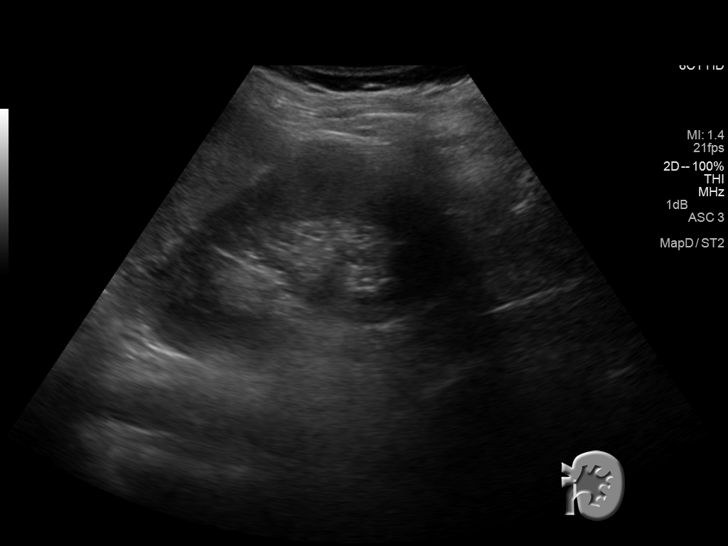
[im 27/41]
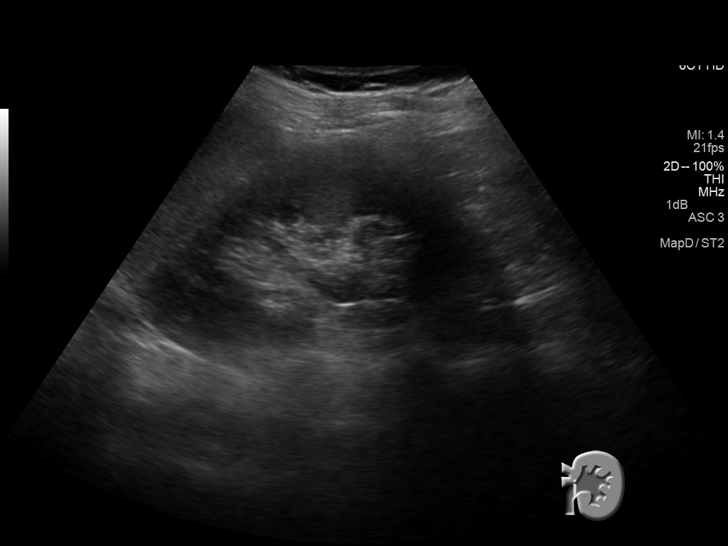
[im 31/41]
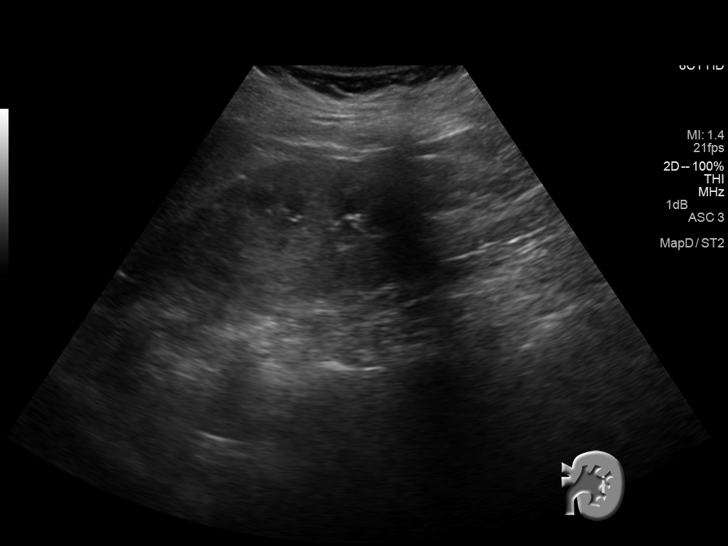
[im 34/41]
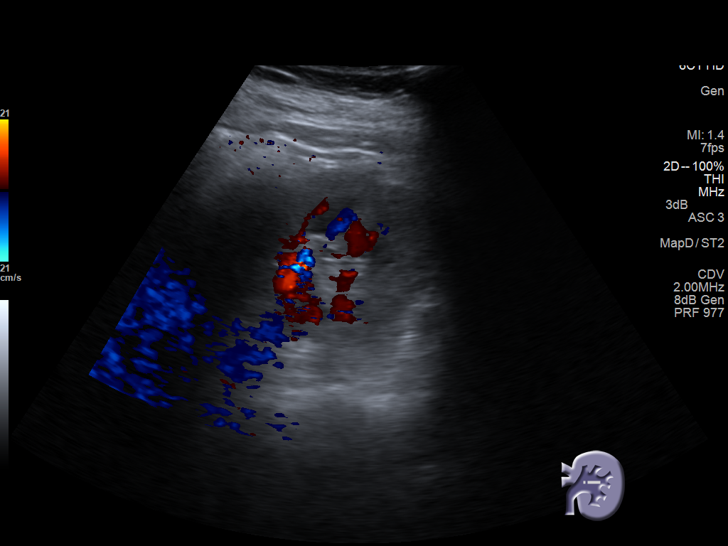
[im 37/41]
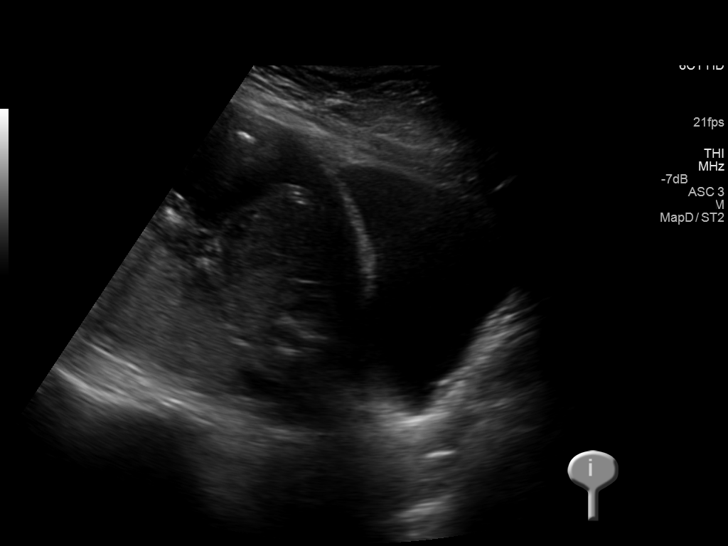
[im 41/41]
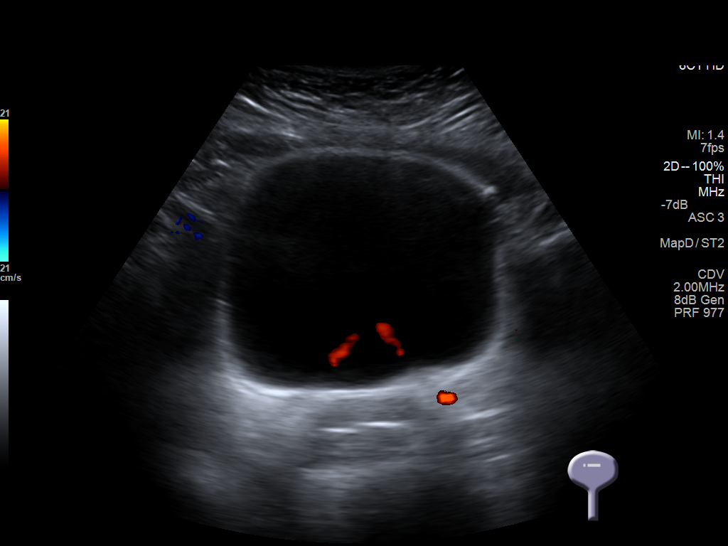

[14 of 25 positions shown; findings below may reference images not displayed]

FINDINGS: Right Kidney:

Length: 12.5 cm.. There is mild pelvocaliectasis on the right. No
stone is evident. There is no cystic or solid mass. The cortical
echotexture is normal.

Left Kidney:

Length: 11.4 cm.. There is no hydronephrosis. The renal cortical
echotexture is normal.

Bladder:

Appears normal for degree of bladder distention. Bilateral ureteral
jets are observed.
IMPRESSION: Mild right-sided hydronephrosis. The right ureteral jet is still
demonstrated. The left kidney and ureteral jet are unremarkable.

## 2016-02-11 ENCOUNTER — Telehealth: Payer: Self-pay | Admitting: Obstetrics and Gynecology

## 2016-02-11 NOTE — Telephone Encounter (Signed)
Called pt she states that she is having a daily persistent headache, it is temporarily relieved by ibuprofen but returns shortly after medication. Pt states that the pain is sharp and located at the back of her head near her right ear, pt states she is sensitive to sound. Pt also notes one episode of blurred vision in the right eye. Please advise.

## 2016-02-11 NOTE — Telephone Encounter (Signed)
Vicki Cruz is having bad headaches in the back of her head for days, neck stiff. She is taking 800 mg ibuprohen. It helps but headache comes right back.

## 2016-02-11 NOTE — Telephone Encounter (Signed)
Please advise patient that she can try taking a Percocet if she has any left from her surgery, and also drink caffeine (1/2 to 1 can of soda) every 2 hours. If still no relief, may have to refer to Pain clinic to be assessed by Anesthesiologist for possible epidural headache.

## 2016-02-12 ENCOUNTER — Telehealth: Payer: Self-pay | Admitting: Obstetrics and Gynecology

## 2016-02-12 ENCOUNTER — Ambulatory Visit (INDEPENDENT_AMBULATORY_CARE_PROVIDER_SITE_OTHER): Payer: Managed Care, Other (non HMO) | Admitting: Obstetrics and Gynecology

## 2016-02-12 ENCOUNTER — Encounter: Payer: Self-pay | Admitting: Obstetrics and Gynecology

## 2016-02-12 VITALS — BP 135/81 | HR 81 | Ht 64.0 in | Wt 150.6 lb

## 2016-02-12 DIAGNOSIS — F411 Generalized anxiety disorder: Secondary | ICD-10-CM

## 2016-02-12 DIAGNOSIS — Z98891 History of uterine scar from previous surgery: Secondary | ICD-10-CM

## 2016-02-12 DIAGNOSIS — O9089 Other complications of the puerperium, not elsewhere classified: Secondary | ICD-10-CM

## 2016-02-12 DIAGNOSIS — R519 Headache, unspecified: Secondary | ICD-10-CM

## 2016-02-12 DIAGNOSIS — R51 Headache: Secondary | ICD-10-CM

## 2016-02-12 DIAGNOSIS — F410 Panic disorder [episodic paroxysmal anxiety] without agoraphobia: Secondary | ICD-10-CM

## 2016-02-12 DIAGNOSIS — Z5189 Encounter for other specified aftercare: Secondary | ICD-10-CM

## 2016-02-12 LAB — COMPREHENSIVE METABOLIC PANEL
A/G RATIO: 1.4 (ref 1.1–2.5)
ALBUMIN: 4 g/dL (ref 3.5–5.5)
ALK PHOS: 130 IU/L — AB (ref 39–117)
ALT: 21 IU/L (ref 0–32)
AST: 20 IU/L (ref 0–40)
BILIRUBIN TOTAL: 0.3 mg/dL (ref 0.0–1.2)
BUN / CREAT RATIO: 13 (ref 8–20)
BUN: 9 mg/dL (ref 6–20)
CHLORIDE: 104 mmol/L (ref 96–106)
CO2: 21 mmol/L (ref 18–29)
Calcium: 8.6 mg/dL — ABNORMAL LOW (ref 8.7–10.2)
Creatinine, Ser: 0.7 mg/dL (ref 0.57–1.00)
GFR calc Af Amer: 138 mL/min/{1.73_m2} (ref >59)
GFR calc non Af Amer: 120 mL/min/{1.73_m2} (ref >59)
GLOBULIN, TOTAL: 2.8 g/dL (ref 1.5–4.5)
Glucose: 88 mg/dL (ref 65–99)
POTASSIUM: 4.2 mmol/L (ref 3.5–5.2)
SODIUM: 143 mmol/L (ref 134–144)
Total Protein: 6.8 g/dL (ref 6.0–8.5)

## 2016-02-12 LAB — URIC ACID: URIC ACID: 4.7 mg/dL (ref 2.5–7.1)

## 2016-02-12 LAB — CBC
HEMATOCRIT: 30.8 % — AB (ref 34.0–46.6)
Hemoglobin: 10.1 g/dL — ABNORMAL LOW (ref 11.1–15.9)
MCH: 23.6 pg — ABNORMAL LOW (ref 26.6–33.0)
MCHC: 32.8 g/dL (ref 31.5–35.7)
MCV: 72 fL — ABNORMAL LOW (ref 79–97)
Platelets: 242 10*3/uL (ref 150–379)
RBC: 4.28 x10E6/uL (ref 3.77–5.28)
RDW: 18.2 % — AB (ref 12.3–15.4)
WBC: 10.9 10*3/uL — ABNORMAL HIGH (ref 3.4–10.8)

## 2016-02-12 LAB — PROTEIN / CREATININE RATIO, URINE
Creatinine, Urine: 342.3 mg/dL
PROTEIN UR: 102.6 mg/dL
Protein/Creat Ratio: 300 mg/g creat — ABNORMAL HIGH (ref 0–200)

## 2016-02-12 MED ORDER — SERTRALINE HCL 50 MG PO TABS: 50.0000 mg | ORAL_TABLET | Freq: Every day | ORAL | Status: DC

## 2016-02-12 NOTE — Progress Notes (Signed)
   OBSTETRIC CLINIC PROGRESS NOTE   Subjective:     Vicki Cruz is a 25 y.o. G67P2012 female who presents to the clinic 1 weeks status post repeat C-section with BTL. Eating a regular diet without difficulty. Bowel movements are normal. Pain is controlled with current analgesics. Medications being used: prescription NSAID's including ibuprofen (Motrin) and narcotic analgesics including oxycodone/acetaminophen (Percocet).  The following portions of the patient's history were reviewed and updated as appropriate: allergies, current medications, past family history, past medical history, past social history, past surgical history and problem list.  Review of Systems A comprehensive review of systems was negative except for: Cardiovascular: positive for lower extremity edema Neurological: positive for headaches Behavioral/Psych: positive for anxiety    Objective:    BP 135/81 mmHg  Pulse 81  Ht  (1.626 m)  Wt 150 lb 9.6 oz (68.312 kg)  BMI 25.84 kg/m2   General:  alert and no distress  Abdomen: soft, bowel sounds active, non-tender  Incision:   healing well, no drainage, no erythema, no hernia, no seroma, no swelling, no dehiscence, incision well approximated  Neurolo:gic Grossly normal  Psychiatric:  normal mood and affect; positive for obsessive thoughts, worry, feelings of impending doom, mood changes, tearfulness        Assessment:    Doing well postoperatively. S/p repeat  C-section wth BTL  Anxiety with panic attacks  Plan:   1. Continue any current medications. 2. Wound care discussed. 3. Activity restrictions: no bending, stooping, or squatting, no climbing multiple stair cases and no lifting more than 15 pounds 4. Anticipated return to work: 5 weeks. 5.  Patient with headaches, LE swelling.  Concerned for postpartum pre-eclampsia (however patient's BPs wnl, although higher than patieet's norm).  Will order labs.  If negative, will remind patient to look out  for signs or symptoms.  Otherwise, can treat with caffeine, Excedrin, or Fioricet. 6. Anxiety - patient notes that she is having anxiety and panic episodes almost daily.  Desires treatment with medication as she does not think counseling will be as helpful. Initiated on Zoloft.  7. Follow up: 5 weeks for final postpartum check.  Will f/u with anxiety symptoms at that time.      Hildred Laser, MD [facility]

## 2016-02-12 NOTE — Patient Instructions (Signed)
Notify MD if headaches continue to persist despite use of Excedrin, or if visual changes reoccur.  You have been called in a prescription for Zoloft for management of anxiety and panic attacks. Begin with 1/2 tablet daily for first week, then increase to 1 whole tablet daily.

## 2016-02-12 NOTE — Telephone Encounter (Signed)
Pt advised at her appt today by Dr.Cherry.

## 2016-02-12 NOTE — Telephone Encounter (Signed)
Contacted patient, left voicemail informing patient that [facility] labs were normal.

## 2016-02-14 ENCOUNTER — Telehealth: Payer: Self-pay

## 2016-02-14 NOTE — Telephone Encounter (Signed)
-----   Message from Hildred Laser, MD sent at 02/12/2016  4:00 PM EST ----- Please inform patient of normal [facility] labs

## 2016-02-14 NOTE — Telephone Encounter (Signed)
Pt was contacted on 02/12/2016 by Dr.Cherry and informed of normal labs.

## 2016-03-11 ENCOUNTER — Encounter: Payer: Self-pay | Admitting: Obstetrics and Gynecology

## 2016-03-11 ENCOUNTER — Ambulatory Visit (INDEPENDENT_AMBULATORY_CARE_PROVIDER_SITE_OTHER): Payer: Managed Care, Other (non HMO) | Admitting: Obstetrics and Gynecology

## 2016-03-11 VITALS — BP 131/88 | HR 80 | Ht 64.0 in | Wt 140.7 lb

## 2016-03-11 DIAGNOSIS — F411 Generalized anxiety disorder: Secondary | ICD-10-CM

## 2016-03-11 DIAGNOSIS — F410 Panic disorder [episodic paroxysmal anxiety] without agoraphobia: Secondary | ICD-10-CM

## 2016-03-11 DIAGNOSIS — O9081 Anemia of the puerperium: Secondary | ICD-10-CM

## 2016-03-11 MED ORDER — SUMATRIPTAN SUCCINATE 50 MG PO TABS: 50.0000 mg | ORAL_TABLET | ORAL | Status: DC | PRN

## 2016-03-11 MED ORDER — IBUPROFEN 800 MG PO TABS: 800.0000 mg | ORAL_TABLET | Freq: Three times a day (TID) | ORAL | Status: DC | PRN

## 2016-03-11 MED ORDER — SERTRALINE HCL 100 MG PO TABS: 100.0000 mg | ORAL_TABLET | Freq: Every day | ORAL | Status: DC

## 2016-03-11 NOTE — Progress Notes (Signed)
   OBSTETRICS POSTPARTUM CLINIC PROGRESS NOTE  Subjective:     Vicki Cruz is a 25 y.o. 323P2012 female who presents for a postpartum visit. She is 6 weeks postpartum following a low cervical transverse Cesarean section with BTL. I have fully reviewed the prenatal and intrapartum course. The delivery was at 39 gestational weeks.  Anesthesia: spinal. Postpartum course has been complicated by anxiety and panic attacks.  Was initiated on Zoloft last visit. Baby's course has been well. Baby is feeding by bottle. Bleeding: patient has had menses, with last menstrual period of 03/11/2016. Bowel function is normal. Bladder function is normal. Patient is not sexually active. Contraception method desired is tubal ligation (received at time of C-section. Postpartum depression screening: positive, PHQ-score is 5.  Patient has been taking Zoloft as prescribed, however feels as though it is not really working.  Still noting anxiety (generalized) with associated panic attacks (however panic attacks have decreased slightly).   The following portions of the patient's history were reviewed and updated as appropriate: allergies, current medications, past family history, past medical history, past social history, past surgical history and problem list.  Review of Systems Pertinent items noted in HPI and remainder of comprehensive ROS otherwise negative.   Objective:    BP 131/88 mmHg  Pulse 80  Ht 5\' 4"  (1.626 m)  Wt 140 lb 11.2 oz (63.821 kg)  BMI 24.14 kg/m2  LMP 03/11/2016  Breastfeeding? No  General:  alert and no distress   Breasts:  inspection negative, no nipple discharge or bleeding, no masses or nodularity palpable  Lungs: clear to auscultation bilaterally  Heart:  regular rate and rhythm, S1, S2 normal, no murmur, click, rub or gallop  Abdomen: soft, non-tender; bowel sounds normal; no masses,  no organomegaly.  Well healed Pfannenstiel incision   Vulva:  normal  Vagina: normal vagina, no  discharge, exudate, lesion, or erythema  Cervix:  no cervical motion tenderness and no lesions  Corpus: normal size, contour, position, consistency, mobility, non-tender  Adnexa:  normal adnexa and no mass, fullness, tenderness  Rectal Exam: Not performed.         Labs:  Lab Results  Component Value Date   HGB 7.5* 02/05/2016     Assessment:    Routine postpartum exam.   Anemia of pregnancy Anxiety (and h/o depression) with panic attacks.    Plan:   1. Contraception: tubal ligation 2. Will check Hgb for h/o anemia.  3.  Will increase Zoloft to 100 mg daily (currently taking 50 mg).  Will f/u in 3-4 weeks to reassess treatment.  4. Follow up in: 3-4 weeks for reassessment of medications, andin 3-6 months for annual exam, or as needed.    Hildred LaserAnika Finnbar Cedillos, MD [facility]

## 2016-03-12 LAB — HEMOGLOBIN AND HEMATOCRIT, BLOOD
HEMATOCRIT: 37.8 % (ref 34.0–46.6)
Hemoglobin: 11.5 g/dL (ref 11.1–15.9)

## 2016-03-13 ENCOUNTER — Telehealth: Payer: Self-pay

## 2016-03-13 NOTE — Telephone Encounter (Signed)
Called pt informed her of normal Hgb, and the need to discontinue iron tablets. Pt gave verbal understanding.

## 2016-04-01 ENCOUNTER — Ambulatory Visit (INDEPENDENT_AMBULATORY_CARE_PROVIDER_SITE_OTHER): Payer: Managed Care, Other (non HMO) | Admitting: Obstetrics and Gynecology

## 2016-04-01 VITALS — BP 115/72 | HR 105 | Ht 64.0 in | Wt 142.4 lb

## 2016-04-01 DIAGNOSIS — F410 Panic disorder [episodic paroxysmal anxiety] without agoraphobia: Secondary | ICD-10-CM

## 2016-04-01 MED ORDER — ALPRAZOLAM 1 MG PO TABS: 1.0000 mg | ORAL_TABLET | Freq: Three times a day (TID) | ORAL | Status: DC | PRN

## 2016-04-01 MED ORDER — SERTRALINE HCL 100 MG PO TABS: 200.0000 mg | ORAL_TABLET | Freq: Every day | ORAL | Status: DC

## 2016-04-01 NOTE — Progress Notes (Signed)
    GYNECOLOGY PROGRESS NOTE  Subjective:    Patient ID: Vicki Cruz, female    DOB: 01/29/1991, 25 y.o.   MRN: [id number]  HPI  Patient is a 25 y.o. 693P2012 female who presents for 4 week f/u of anxiety with panic attacks.  Was initiated on Zoloft.  States that she has titrated dose up to 100 mg and notes no major difference in symptoms. Also still notes anxiety about leaving infant alone for even a few seconds, or other people touching her baby.   The following portions of the patient's history were reviewed and updated as appropriate: allergies, current medications, past family history, past medical history, past social history, past surgical history and problem list.  Review of Systems Pertinent items noted in HPI and remainder of comprehensive ROS otherwise negative.   Objective:   Blood pressure 115/72, pulse 105, height 5\' 4"  (1.626 m), weight 142 lb 6.4 oz (64.592 kg), last menstrual period 03/11/2016, not currently breastfeeding. General appearance: alert and no distress Exam deferred.   Assessment:   Anxiety with panic attacks  Plan:   Discussion had with patient regarding management options.  Recommend continue to increase Zoloft to max dose of 200 mg.  If still no noticeable results, can change to different medication.  Will also prescribe trial of Xanax to take prn if after max dose of Zoloft symptoms  Persist.  To f/u in 3-4 weeks.    Hildred LaserAnika Soloman Mckeithan, MD [facility]

## 2016-04-08 ENCOUNTER — Ambulatory Visit: Payer: Managed Care, Other (non HMO) | Admitting: Obstetrics and Gynecology

## 2016-04-29 ENCOUNTER — Ambulatory Visit: Payer: Managed Care, Other (non HMO) | Admitting: Obstetrics and Gynecology

## 2016-06-09 ENCOUNTER — Ambulatory Visit: Payer: Self-pay | Admitting: Primary Care

## 2016-06-13 ENCOUNTER — Ambulatory Visit: Payer: Self-pay | Admitting: Primary Care

## 2016-06-25 ENCOUNTER — Telehealth: Payer: Self-pay | Admitting: General Practice

## 2016-06-25 ENCOUNTER — Ambulatory Visit: Payer: Self-pay | Admitting: Primary Care

## 2016-06-25 DIAGNOSIS — Z0289 Encounter for other administrative examinations: Secondary | ICD-10-CM

## 2016-06-25 NOTE — Telephone Encounter (Signed)
Patient did not come in for their appointment today for new pt appt.  Please let me know if patient needs to be contacted immediately for follow up or no follow up needed. °

## 2016-06-26 NOTE — Telephone Encounter (Signed)
No immediate contact needed. Thanks!

## 2016-12-16 IMAGING — US US OB < 14 WEEKS - US OB TV
1 series · 14 of 28 positions shown · non-contrast
Comparison: None.

CLINICAL DATA: Vaginal bleeding.  Early pregnancy.

EXAM:
OBSTETRIC <14 WK US AND TRANSVAGINAL OB US
TECHNIQUE: Both transabdominal and transvaginal ultrasound examinations were
performed for complete evaluation of the gestation as well as the
maternal uterus, adnexal regions, and pelvic cul-de-sac.
Transvaginal technique was performed to assess early pregnancy.

[Series 1: us ob < 14 weeks - us ob tv · 0.20mm/px · 14 of 140 slices shown]
[im 6/140]
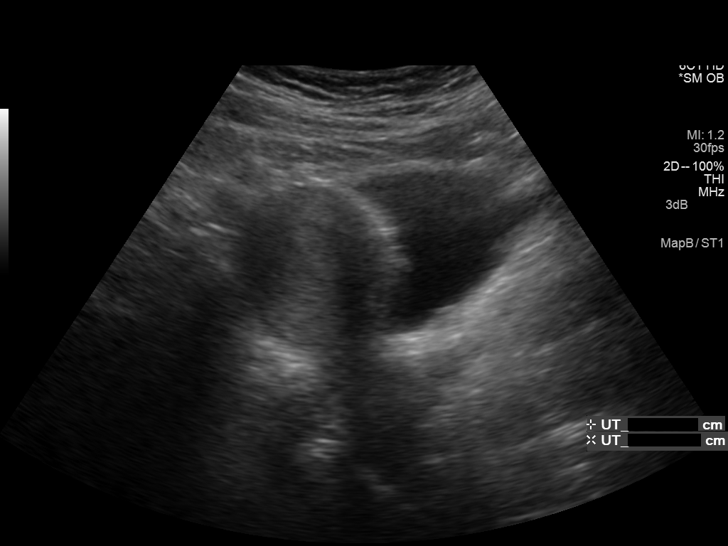
[im 16/140]
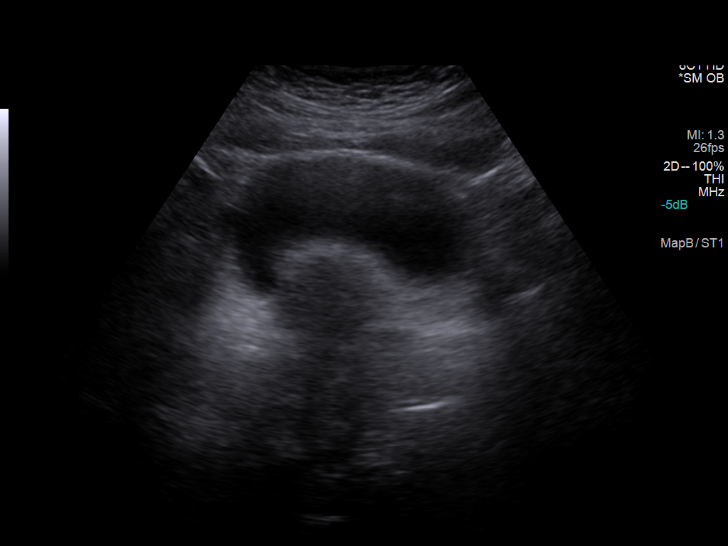
[im 26/140]
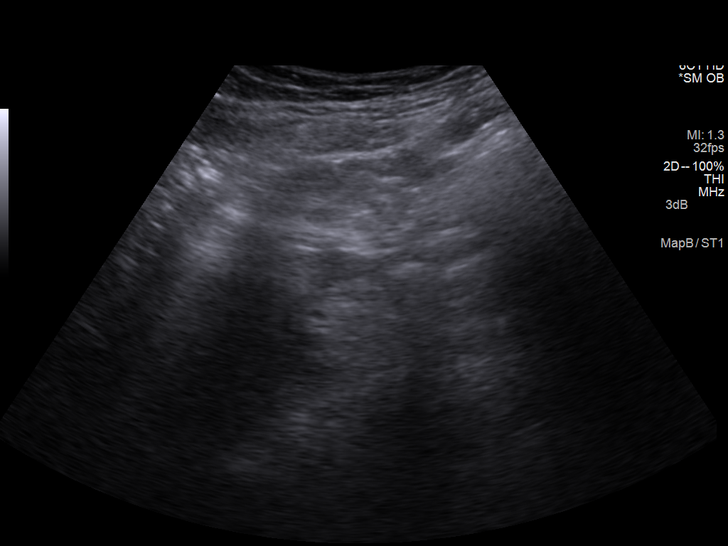
[im 37/140]
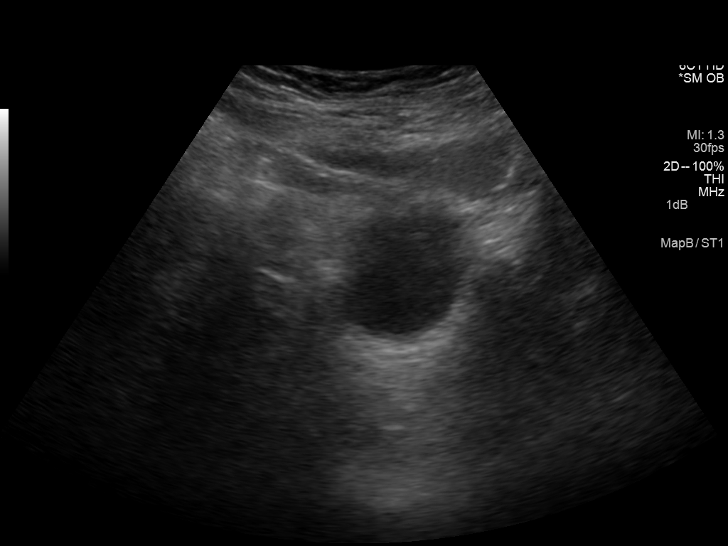
[im 47/140]
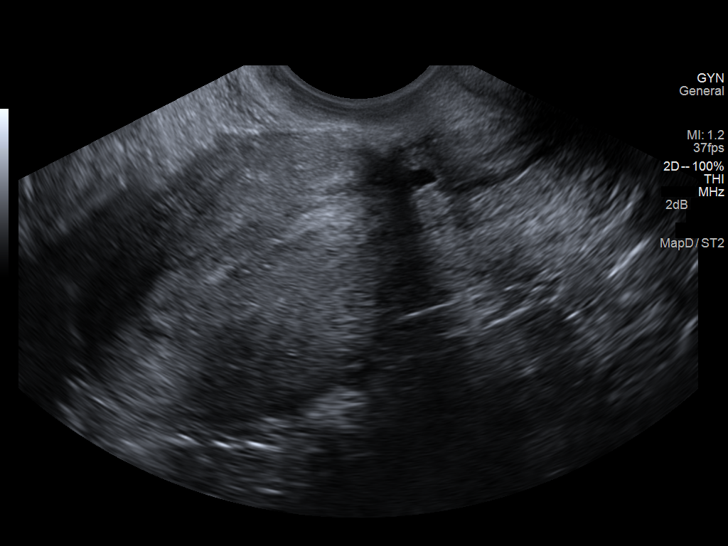
[im 57/140]
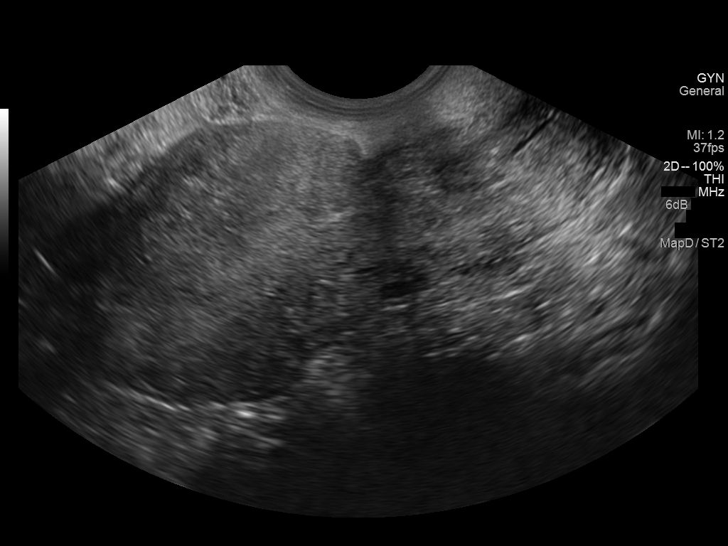
[im 67/140]
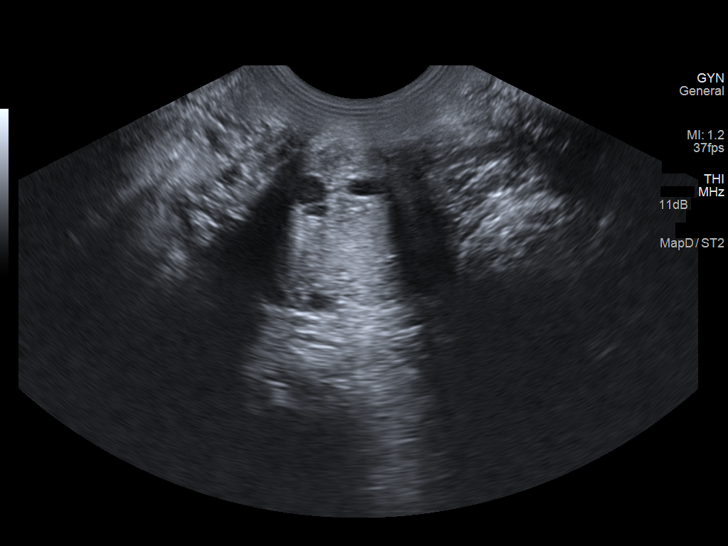
[im 78/140]
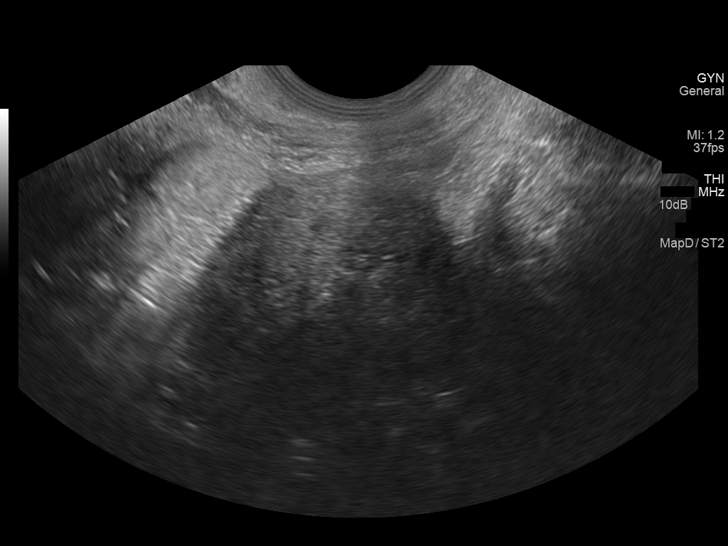
[im 88/140]
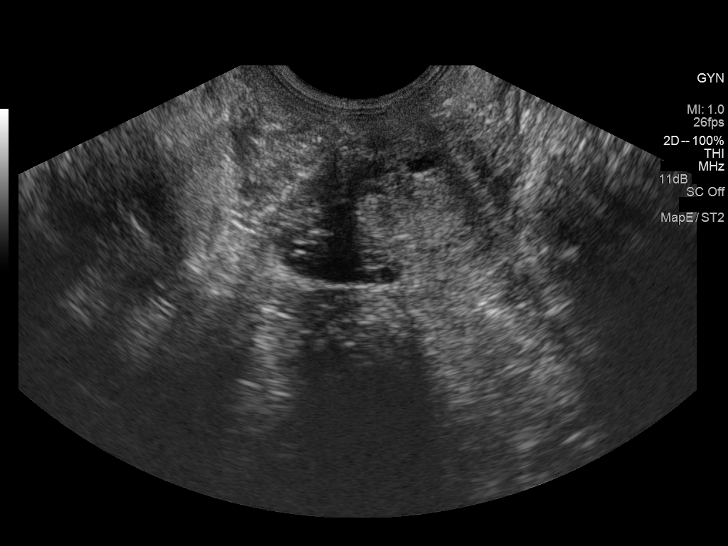
[im 98/140]
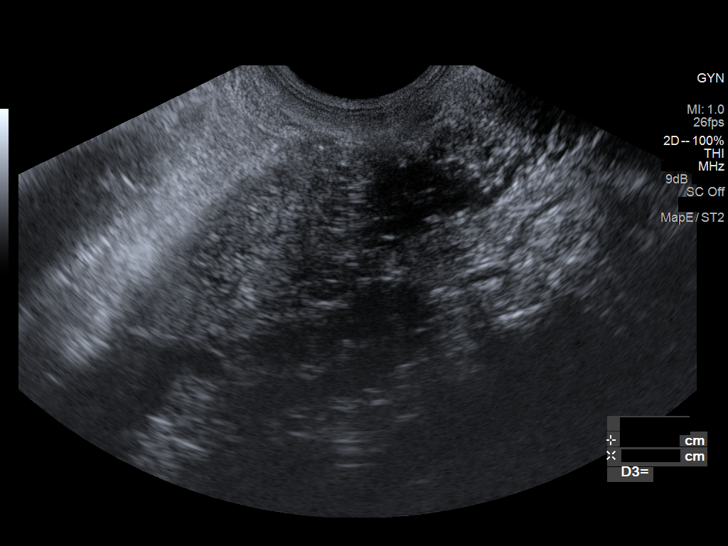
[im 109/140]
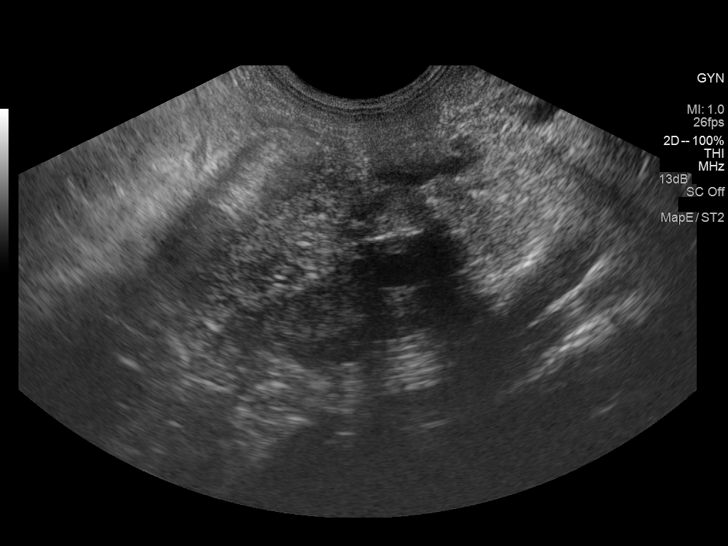
[im 119/140]
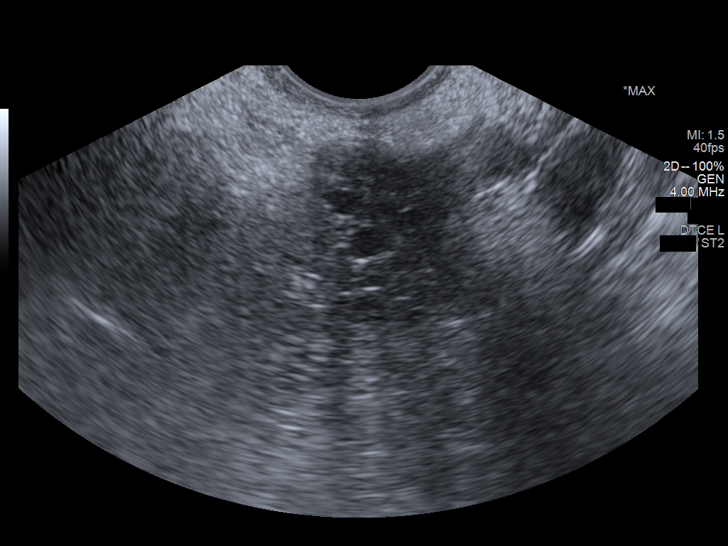
[im 129/140]
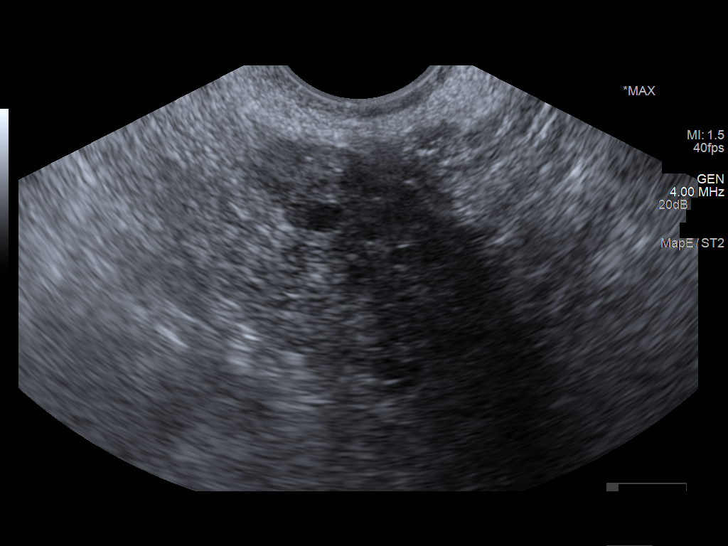
[im 140/140]
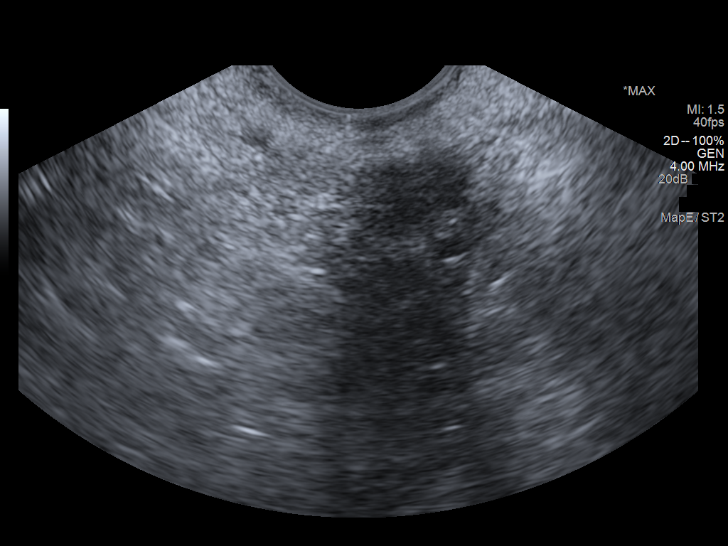

[14 of 28 positions shown; findings below may reference images not displayed]

FINDINGS: Intrauterine gestational sac: None

Yolk sac:  No

Embryo:  No

Cardiac Activity: No

Maternal uterus/adnexae: Ovaries are normal. Endometrial thickness
is 10 mm. No visible adnexal masses or free fluid.
IMPRESSION: Normal pelvic ultrasound. No evidence of ectopic or intrauterine
pregnancy.

## 2017-05-12 ENCOUNTER — Encounter: Payer: Self-pay | Admitting: Primary Care

## 2017-05-12 ENCOUNTER — Ambulatory Visit (INDEPENDENT_AMBULATORY_CARE_PROVIDER_SITE_OTHER): Payer: Commercial Managed Care - PPO | Admitting: Primary Care

## 2017-05-12 VITALS — BP 120/80 | HR 73 | Temp 98.5°F | Ht 64.25 in | Wt 155.4 lb

## 2017-05-12 DIAGNOSIS — F411 Generalized anxiety disorder: Secondary | ICD-10-CM | POA: Diagnosis not present

## 2017-05-12 DIAGNOSIS — G43909 Migraine, unspecified, not intractable, without status migrainosus: Secondary | ICD-10-CM

## 2017-05-12 DIAGNOSIS — G43701 Chronic migraine without aura, not intractable, with status migrainosus: Secondary | ICD-10-CM | POA: Diagnosis not present

## 2017-05-12 HISTORY — DX: Migraine, unspecified, not intractable, without status migrainosus: G43.909

## 2017-05-12 MED ORDER — PAROXETINE HCL 10 MG PO TABS: 10.0000 mg | ORAL_TABLET | Freq: Every day | ORAL | 1 refills | Status: DC

## 2017-05-12 NOTE — Assessment & Plan Note (Signed)
No improvement on Zoloft, not taking Alprazolam. Discouraged use of Alprazolam, will not prescribe. GAD 7 score of 18 and PHQ 9 score of 9 today.  Discussed treatment options including therapy and medication, she would like to try a different medication.  Paxil 10 mg sent into pharmacy. Patient is to take 1/2 tablet daily for 8 days, then advance to 1 full tablet thereafter. We discussed possible side effects of headache, GI upset, drowsiness, and SI/HI. If thoughts of SI/HI develop, we discussed to present to the emergency immediately. Patient verbalized understanding.   Follow up in 6 weeks for re-evaluation.

## 2017-05-12 NOTE — Assessment & Plan Note (Signed)
Taking Imitrex every 1-2 months, requires two doses plus Ibuprofen. Continue to monitor.

## 2017-05-12 NOTE — Progress Notes (Signed)
Subjective:    Patient ID: Vicki Cruz, female    DOB: [birth year], 26 y.o.   MRN: [id number]  HPI  Vicki Cruz is a 26 year old female who presents today to establish care and discuss the problems mentioned below. Will obtain old records.  1) Generalized Anxiety Disorder: Diagnosed about 1 year ago per GYN. Previously managed on Alprazolam 1 mg that was prescribed by her OB/GYN for three times daily use. She was previously managed on Zoloft 200 mg for which she stopped taking several months ago as she didn't like the way it made her feel. She's not taken her Alprazolam in several months.   She does experience feelings of daily anxiety, worry, palpitations. She will typically manage her symptoms with self-calming techniques but these aren't effective. GAD 7 score of 18 and PHQ 9 score of 9 today. She denies SI/HI. She is interested in a different treatment.  2) Migraines: Occur 1-2 times monthly. She takes Imitrex 50 mg x two doses with Ibuprofen with improvement.   Review of Systems  Constitutional: Positive for fatigue.  Respiratory: Negative for shortness of breath.   Cardiovascular: Negative for chest pain.  Neurological: Negative for dizziness.       Intermittent headaches  Psychiatric/Behavioral: Positive for sleep disturbance. Negative for suicidal ideas. The patient is nervous/anxious.        Past Medical History:  Diagnosis Date  . History of miscarriage   . Preterm labor    1st pregnancy had to get medicine at 21 weeks because of labor  . Tobacco abuse    quit: 06/06/2015     Social History   Social History  . Marital status: Married    Spouse name: N/A  . Number of children: 1  . Years of education: N/A   Occupational History  . sales    Social History Main Topics  . Smoking status: Former Smoker    Quit date: 05/16/2015  . Smokeless tobacco: Never Used  . Alcohol use No  . Drug use: No  . Sexual activity: Yes    Partners: Male    Birth control/  protection: Surgical   Other Topics Concern  . Not on file   Social History Narrative  . No narrative on file    Past Surgical History:  Procedure Laterality Date  . CESAREAN SECTION  2008  . CESAREAN SECTION WITH BILATERAL TUBAL LIGATION Bilateral 02/04/2016   Procedure: REPEAT CESAREAN SECTION WITH BILATERAL TUBAL LIGATION;  Surgeon: Hildred Laser, MD;  Location: [facility] ORS;  Service: Obstetrics;  Laterality: Bilateral;  . IUD REMOVAL      Family History  Problem Relation Age of Onset  . Cancer Paternal Grandfather        unsure of what type    Allergies  Allergen Reactions  . Penicillin G Rash  . Penicillins Nausea And Vomiting, Rash and Other (See Comments)    Reaction:  Fever  Has patient had a PCN reaction causing immediate rash, facial/tongue/throat swelling, SOB or lightheadedness with hypotension: No Has patient had a PCN reaction causing severe rash involving mucus membranes or skin necrosis: No Has patient had a PCN reaction that required hospitalization No Has patient had a PCN reaction occurring within the last 10 years: No If all of the above answers are "NO", then may proceed with Cephalosporin use.    Current Outpatient Prescriptions on File Prior to Visit  Medication Sig Dispense Refill  . SUMAtriptan (IMITREX) 50 MG tablet Take 1 tablet (50  mg total) by mouth every 2 (two) hours as needed for migraine. Can take up to 2 tablets every 2 hours as needed. (Patient not taking: Reported on 05/12/2017) 30 tablet 3   No current facility-administered medications on file prior to visit.     BP 120/80   Pulse 73   Temp 98.5 F (36.9 C) (Oral)   Ht 5' 4.25" (1.632 m)   Wt 155 lb 6.4 oz (70.5 kg)   LMP 05/11/2017   SpO2 98%   BMI 26.47 kg/m    Objective:   Physical Exam  Constitutional: She appears well-nourished.  Neck: Neck supple.  Cardiovascular: Normal rate and regular rhythm.   Pulmonary/Chest: Effort normal and breath sounds normal.  Skin: Skin is  warm and dry.  Psychiatric: She has a normal mood and affect.          Assessment & Plan:

## 2017-05-12 NOTE — Patient Instructions (Signed)
Start Paxil 10 mg tablets for anxiety and depression. Start by taking 1/2 tablet daily for 8 days, then increase to 1 full tablet thereafter.  Schedule a follow up visit in 6 weeks for re-evaluation.  It was a pleasure to meet you today! Please don't hesitate to call me with any questions. Welcome to [facility]!

## 2017-07-21 ENCOUNTER — Ambulatory Visit (INDEPENDENT_AMBULATORY_CARE_PROVIDER_SITE_OTHER): Payer: Commercial Managed Care - PPO | Admitting: Primary Care

## 2017-07-21 ENCOUNTER — Encounter: Payer: Self-pay | Admitting: Primary Care

## 2017-07-21 VITALS — BP 108/70 | HR 69 | Temp 98.4°F | Ht 64.25 in | Wt 157.1 lb

## 2017-07-21 DIAGNOSIS — F419 Anxiety disorder, unspecified: Secondary | ICD-10-CM

## 2017-07-21 DIAGNOSIS — F329 Major depressive disorder, single episode, unspecified: Secondary | ICD-10-CM

## 2017-07-21 DIAGNOSIS — F411 Generalized anxiety disorder: Secondary | ICD-10-CM | POA: Diagnosis not present

## 2017-07-21 MED ORDER — FLUOXETINE HCL 20 MG PO TABS: 20.0000 mg | ORAL_TABLET | Freq: Every day | ORAL | 1 refills | Status: DC

## 2017-07-21 NOTE — Progress Notes (Signed)
Subjective:    Patient ID: Vicki Cruz, female    DOB: 07-03-1991, 27 y.o.   MRN: [id number]  HPI  Vicki Cruz is a 26 year old female who presents today for follow up anxiety. She presented in late May 2018 with a one year long complaint of anxiety, worry, palpitations. She was initiated on Paxil 10 mg during her last visit for GAD 7 score of 18 and PHQ 9 score of 9.   Since her last visit she's not noticed any improvement in her symptoms. She took the Paxil and didn't feel any different. Symptoms still include nervous, depressed, worrying. More recently she's been experiencing crying spells and irritability. She stopped taking Paxil 2-3 weeks ago, did not contact our office and stopped suddenly without weaning. She is interested in trying another medication. She denies SI/HI.  Review of Systems  Constitutional: Positive for fatigue.  Respiratory: Negative for shortness of breath.   Cardiovascular: Negative for chest pain and palpitations.  Psychiatric/Behavioral: Negative for sleep disturbance and suicidal ideas. The patient is nervous/anxious.        Past Medical History:  Diagnosis Date  . History of miscarriage   . Migraines 05/12/2017  . Preterm labor    1st pregnancy had to get medicine at 21 weeks because of labor  . Tobacco abuse    quit: 06/06/2015     Social History   Social History  . Marital status: Married    Spouse name: N/A  . Number of children: 1  . Years of education: N/A   Occupational History  . sales    Social History Main Topics  . Smoking status: Former Smoker    Quit date: 05/16/2015  . Smokeless tobacco: Never Used  . Alcohol use No  . Drug use: No  . Sexual activity: Yes    Partners: Male    Birth control/ protection: Surgical   Other Topics Concern  . Not on file   Social History Narrative  . No narrative on file    Past Surgical History:  Procedure Laterality Date  . CESAREAN SECTION  2008  . CESAREAN SECTION WITH BILATERAL  TUBAL LIGATION Bilateral 02/04/2016   Procedure: REPEAT CESAREAN SECTION WITH BILATERAL TUBAL LIGATION;  Surgeon: Hildred Laser, MD;  Location: [facility] ORS;  Service: Obstetrics;  Laterality: Bilateral;  . IUD REMOVAL      Family History  Problem Relation Age of Onset  . Cancer Paternal Grandfather        unsure of what type    Allergies  Allergen Reactions  . Penicillin G Rash  . Penicillins Nausea And Vomiting, Rash and Other (See Comments)    Reaction:  Fever  Has patient had a PCN reaction causing immediate rash, facial/tongue/throat swelling, SOB or lightheadedness with hypotension: No Has patient had a PCN reaction causing severe rash involving mucus membranes or skin necrosis: No Has patient had a PCN reaction that required hospitalization No Has patient had a PCN reaction occurring within the last 10 years: No If all of the above answers are "NO", then may proceed with Cephalosporin use.    Current Outpatient Prescriptions on File Prior to Visit  Medication Sig Dispense Refill  . SUMAtriptan (IMITREX) 50 MG tablet Take 1 tablet (50 mg total) by mouth every 2 (two) hours as needed for migraine. Can take up to 2 tablets every 2 hours as needed. (Patient not taking: Reported on 05/12/2017) 30 tablet 3   No current facility-administered medications on file prior to  visit.     BP 108/70   Pulse 69   Temp 98.4 F (36.9 C) (Oral)   Ht 5' 4.25" (1.632 m)   Wt 157 lb 1.9 oz (71.3 kg)   LMP 07/21/2017   SpO2 98%   BMI 26.76 kg/m    Objective:   Physical Exam  Constitutional: She appears well-nourished.  Neck: Neck supple.  Cardiovascular: Normal rate and regular rhythm.   Pulmonary/Chest: Effort normal and breath sounds normal.  Skin: Skin is warm and dry.  Psychiatric: She has a normal mood and affect.          Assessment & Plan:

## 2017-07-21 NOTE — Patient Instructions (Signed)
Start fluoxetine 20 mg tablets. Take 1/2 tablet daily for 8 days, then advance to 1 full tablet thereafter.  Please message or call me if you have any problems. Update me in 1 month in regards to your anxiety.   It was a pleasure to see you today!

## 2017-07-21 NOTE — Assessment & Plan Note (Signed)
No improvement on Paxil, stopped this without consulting our office. Discussed other options, will try Prozac.   Rx for Prozac 20 mg sent to pharmacy. Patient is to take 1/2 tablet daily for 8 days, then advance to 1 full tablet thereafter. We discussed possible side effects of headache, GI upset, drowsiness, and SI/HI. If thoughts of SI/HI develop, we discussed to present to the emergency immediately. Patient verbalized understanding.   We will message her in 4 weeks for an update.

## 2017-08-18 ENCOUNTER — Telehealth: Payer: Self-pay | Admitting: Primary Care

## 2017-08-18 NOTE — Telephone Encounter (Signed)
Message left for patient to return my call and send patient on MyChart.

## 2017-08-18 NOTE — Telephone Encounter (Signed)
-----   Message from Doreene NestKatherine K Clark, NP sent at 07/21/2017  3:01 PM EDT ----- Regarding: Anxiety and Depression How's she doing since we started Prozac for anxiety?

## 2017-08-19 ENCOUNTER — Encounter: Payer: Self-pay | Admitting: Primary Care

## 2017-08-19 DIAGNOSIS — F419 Anxiety disorder, unspecified: Principal | ICD-10-CM

## 2017-08-19 DIAGNOSIS — F329 Major depressive disorder, single episode, unspecified: Secondary | ICD-10-CM

## 2017-08-19 NOTE — Telephone Encounter (Signed)
Noted, I responded via my chart.

## 2017-08-19 NOTE — Telephone Encounter (Signed)
Patient reply through MyChart 

## 2017-08-20 MED ORDER — FLUOXETINE HCL 40 MG PO CAPS: 40.0000 mg | ORAL_CAPSULE | Freq: Every day | ORAL | 0 refills | Status: DC

## 2017-09-17 ENCOUNTER — Encounter: Payer: Self-pay | Admitting: Primary Care

## 2017-10-07 ENCOUNTER — Encounter: Payer: Self-pay | Admitting: Primary Care

## 2017-10-07 DIAGNOSIS — F411 Generalized anxiety disorder: Secondary | ICD-10-CM

## 2017-10-08 MED ORDER — BUSPIRONE HCL 7.5 MG PO TABS: 7.5000 mg | ORAL_TABLET | Freq: Two times a day (BID) | ORAL | 1 refills | Status: DC

## 2017-10-15 ENCOUNTER — Other Ambulatory Visit: Payer: Self-pay | Admitting: Primary Care

## 2017-10-15 DIAGNOSIS — F419 Anxiety disorder, unspecified: Principal | ICD-10-CM

## 2017-10-15 DIAGNOSIS — F329 Major depressive disorder, single episode, unspecified: Secondary | ICD-10-CM

## 2017-10-15 MED ORDER — FLUOXETINE HCL 40 MG PO CAPS: 40.0000 mg | ORAL_CAPSULE | Freq: Every day | ORAL | 0 refills | Status: DC

## 2017-11-16 ENCOUNTER — Emergency Department: Payer: Commercial Managed Care - PPO

## 2017-11-16 ENCOUNTER — Emergency Department
Admission: EM | Admit: 2017-11-16 | Discharge: 2017-11-16 | Disposition: A | Discharge status: Home / Self Care | Payer: Commercial Managed Care - PPO | Attending: Emergency Medicine | Admitting: Emergency Medicine

## 2017-11-16 ENCOUNTER — Ambulatory Visit: Payer: Self-pay

## 2017-11-16 ENCOUNTER — Encounter: Payer: Self-pay | Admitting: Emergency Medicine

## 2017-11-16 ENCOUNTER — Other Ambulatory Visit: Payer: Self-pay

## 2017-11-16 DIAGNOSIS — F17210 Nicotine dependence, cigarettes, uncomplicated: Secondary | ICD-10-CM | POA: Insufficient documentation

## 2017-11-16 DIAGNOSIS — R079 Chest pain, unspecified: Secondary | ICD-10-CM | POA: Diagnosis not present

## 2017-11-16 DIAGNOSIS — B349 Viral infection, unspecified: Secondary | ICD-10-CM | POA: Diagnosis not present

## 2017-11-16 DIAGNOSIS — Z79899 Other long term (current) drug therapy: Secondary | ICD-10-CM | POA: Diagnosis not present

## 2017-11-16 HISTORY — DX: Generalized anxiety disorder: F41.1

## 2017-11-16 LAB — BASIC METABOLIC PANEL
ANION GAP: 6 (ref 5–15)
BUN: 14 mg/dL (ref 6–20)
CALCIUM: 8.7 mg/dL — AB (ref 8.9–10.3)
CO2: 21 mmol/L — AB (ref 22–32)
CREATININE: 0.62 mg/dL (ref 0.44–1.00)
Chloride: 110 mmol/L (ref 101–111)
Glucose, Bld: 116 mg/dL — ABNORMAL HIGH (ref 65–99)
Potassium: 4 mmol/L (ref 3.5–5.1)
Sodium: 137 mmol/L (ref 135–145)

## 2017-11-16 LAB — INFLUENZA PANEL BY PCR (TYPE A & B)
INFLAPCR: NEGATIVE
INFLBPCR: NEGATIVE

## 2017-11-16 LAB — POCT RAPID STREP A: STREPTOCOCCUS, GROUP A SCREEN (DIRECT): NEGATIVE

## 2017-11-16 LAB — CBC
HCT: 40.9 % (ref 35.0–47.0)
HEMOGLOBIN: 13.8 g/dL (ref 12.0–16.0)
MCH: 28.1 pg (ref 26.0–34.0)
MCHC: 33.7 g/dL (ref 32.0–36.0)
MCV: 83.5 fL (ref 80.0–100.0)
PLATELETS: 151 10*3/uL (ref 150–440)
RBC: 4.89 MIL/uL (ref 3.80–5.20)
RDW: 15.7 % — ABNORMAL HIGH (ref 11.5–14.5)
WBC: 9.3 10*3/uL (ref 3.6–11.0)

## 2017-11-16 LAB — TROPONIN I

## 2017-11-16 MED ORDER — GUAIFENESIN-CODEINE 100-10 MG/5ML PO SOLN: 5.0000 mL | Freq: Three times a day (TID) | ORAL | 0 refills | Status: DC | PRN

## 2017-11-16 MED ORDER — KETOROLAC TROMETHAMINE 30 MG/ML IJ SOLN
30.0000 mg | Freq: Once | INTRAMUSCULAR | Status: AC
  Administered 2017-11-16: 30 mg via INTRAVENOUS
  Filled 2017-11-16: qty 1

## 2017-11-16 NOTE — ED Notes (Signed)
Pt c/o bodyaches with sore throat and pressure/tightness in her chest since yesterday. Pt is in NAD on arrival.

## 2017-11-16 NOTE — Telephone Encounter (Signed)
Noted and reviewed chart. Diagnosed with viral URI.

## 2017-11-16 NOTE — ED Triage Notes (Signed)
Pt to ed with c/o chest pain center of chest, pressure while laying in bed then it went away and she was able to sleep.  Reports this am she awoke with sharpe pain in center of chest, worse with deep breath,  States she felt like she could not catch her breath.  Pt states pain has continued.  Pt brought to ED by [facility] for CP.  EKG done and shown to MD.  Pt reports mild nausea associated with cp.

## 2017-11-16 NOTE — ED Provider Notes (Signed)
[facility] Emergency Department Provider Note  ____________________________________________   First MD Initiated Contact with Patient 11/16/17 1403     (approximate)  I have reviewed the triage vital signs and the nursing notes.   HISTORY  Chief Complaint Chest Pain   HPI Vicki Cruz is a 26 y.o. female with a history of generalized anxiety disorder who is presenting to the emergency department today with 1 day of chest pain associated with body aches and sore throat and cough.  She says that the chest pain is to the center of her chest and does not radiate.  She says that it does worsen with deep breathing.  Says that she smokes cigarettes but is not on any hormone supplementation or birth control.  Patient denies any sick contacts.  Says that she has a low-grade, subjective, fever at home.  Patient says the pain feels like a pressure on the center of her chest and has been constant over the past day.  Also associated with sore throat especially when swallowing and coughing but without any runny nose, ear pressure.  Cough is nonproductive.  Past Medical History:  Diagnosis Date  . GAD (generalized anxiety disorder)   . History of miscarriage   . Migraines 05/12/2017  . Preterm labor    1st pregnancy had to get medicine at 21 weeks because of labor  . Tobacco abuse    quit: 06/06/2015    Patient Active Problem List   Diagnosis Date Noted  . GAD (generalized anxiety disorder) 05/12/2017  . Migraines 05/12/2017  . Tobacco abuse 06/11/2015    Past Surgical History:  Procedure Laterality Date  . CESAREAN SECTION  2008  . CESAREAN SECTION WITH BILATERAL TUBAL LIGATION Bilateral 02/04/2016   Procedure: REPEAT CESAREAN SECTION WITH BILATERAL TUBAL LIGATION;  Surgeon: Hildred LaserAnika Cherry, MD;  Location: [facility] ORS;  Service: Obstetrics;  Laterality: Bilateral;  . IUD REMOVAL      Prior to Admission medications   Medication Sig Start Date End Date Taking?  Authorizing Provider  busPIRone (BUSPAR) 7.5 MG tablet Take 1 tablet (7.5 mg total) by mouth 2 (two) times daily. 10/08/17   Doreene Nestlark, Katherine K, NP  FLUoxetine (PROZAC) 40 MG capsule Take 1 capsule (40 mg total) by mouth daily. 10/15/17   Doreene Nestlark, Katherine K, NP  SUMAtriptan (IMITREX) 50 MG tablet Take 1 tablet (50 mg total) by mouth every 2 (two) hours as needed for migraine. Can take up to 2 tablets every 2 hours as needed. Patient not taking: Reported on 05/12/2017 03/11/16   Hildred Laserherry, Anika, MD    Allergies Penicillin g and Penicillins  Family History  Problem Relation Age of Onset  . Cancer Paternal Grandfather        unsure of what type    Social History Social History   Tobacco Use  . Smoking status: Current Every Day Smoker    Types: Cigarettes    Last attempt to quit: 05/16/2015    Years since quitting: 2.5  . Smokeless tobacco: Never Used  Substance Use Topics  . Alcohol use: No    Alcohol/week: 0.0 oz  . Drug use: No    Review of Systems  Constitutional: No fever/chills Eyes: No visual changes. ENT: No sore throat. Cardiovascular: as above Respiratory: as above Gastrointestinal: No abdominal pain.  No nausea, no vomiting.  No diarrhea.  No constipation. Genitourinary: Negative for dysuria. Musculoskeletal: Negative for back pain. Skin: Negative for rash. Neurological: Negative for headaches, focal weakness or numbness.  ____________________________________________   PHYSICAL EXAM:  VITAL SIGNS: ED Triage Vitals  Enc Vitals Group     BP 11/16/17 1225 130/75     Pulse Rate 11/16/17 1225 86     Resp 11/16/17 1225 18     Temp 11/16/17 1225 98.8 F (37.1 C)     Temp Source 11/16/17 1225 Oral     SpO2 11/16/17 1225 98 %     Weight 11/16/17 1225 140 lb (63.5 kg)     Height 11/16/17 1225 5\' 4"  (1.626 m)     Head Circumference --      Peak Flow --      Pain Score 11/16/17 1230 7     Pain Loc --      Pain Edu? --      Excl. in GC? --     Constitutional:  Alert and oriented. Well appearing and in no acute distress. Eyes: Conjunctivae are normal.  Head: Atraumatic. Nose: No congestion/rhinnorhea. Mouth/Throat: Mucous membranes are moist.  No tonsillar swelling or exudate.  No pharyngeal erythema. Neck: No stridor.   Cardiovascular: Normal rate, regular rhythm. Grossly normal heart sounds.  Chest pain is reproducible to palpation over the sternum.  No erythema, ecchymosis, deformity nor crepitus. Respiratory: Normal respiratory effort.  No retractions. Lungs CTAB. Gastrointestinal: Soft and nontender. No distention. No CVA tenderness. Musculoskeletal: No lower extremity tenderness nor edema.  No joint effusions. Neurologic:  Normal speech and language. No gross focal neurologic deficits are appreciated. Skin:  Skin is warm, dry and intact. No rash noted. Psychiatric: Mood and affect are normal. Speech and behavior are normal.  ____________________________________________   LABS (all labs ordered are listed, but only abnormal results are displayed)  Labs Reviewed  BASIC METABOLIC PANEL - Abnormal; Notable for the following components:      Result Value   CO2 21 (*)    Glucose, Bld 116 (*)    Calcium 8.7 (*)    All other components within normal limits  CBC - Abnormal; Notable for the following components:   RDW 15.7 (*)    All other components within normal limits  TROPONIN I  INFLUENZA PANEL BY PCR (TYPE A & B)  POCT RAPID STREP A  POC URINE PREG, ED   ____________________________________________  EKG  ED ECG REPORT I, Arelia Longest, the attending physician, personally viewed and interpreted this ECG.   Date: 11/16/2017  EKG Time: 1223  Rate: 95  Rhythm: normal sinus rhythm  Axis: Normal  Intervals:none  ST&T Change: No ST segment elevation or depression.  No abnormal T wave inversion.  ____________________________________________  RADIOLOGY  No acute  process. ____________________________________________   PROCEDURES  Procedure(s) performed: None  Procedures  Critical Care performed: No  ____________________________________________   INITIAL IMPRESSION / ASSESSMENT AND PLAN / ED COURSE  Pertinent labs & imaging results that were available during my care of the patient were reviewed by me and considered in my medical decision making (see chart for details).  Differential diagnosis includes, but is not limited to, ACS, aortic dissection, pulmonary embolism, cardiac tamponade, pneumothorax, pneumonia, pericarditis, myocarditis, GI-related causes including esophagitis/gastritis, and musculoskeletal chest wall pain.    As part of my medical decision making, I reviewed the following data within the electronic MEDICAL RECORD NUMBER Notes from prior ED visits  PERC negative.    ----------------------------------------- 3:32 PM on 11/16/2017 -----------------------------------------  Patient at this time with negative strep as well as flu swabs.  Still says that she is having pain.  Very  reassuring lab workup.  Likely viral syndrome causing her constellation of symptoms.  Patient will be discharged home with guaifenesin with codeine.  She is understanding of the plan and willing to comply.  Will follow up with her primary care doctor and will return to the emergency department for any worsening or concerning symptoms.  She is understanding of the plan and willing to comply.      ____________________________________________   FINAL CLINICAL IMPRESSION(S) / ED DIAGNOSES  Chest pain.  Viral syndrome.    NEW MEDICATIONS STARTED DURING THIS VISIT:  This SmartLink is deprecated. Use AVSMEDLIST instead to display the medication list for a patient.   Note:  This document was prepared using Dragon voice recognition software and may include unintentional dictation errors.     Myrna BlazerSchaevitz, David Matthew, MD 11/16/17 [phone]

## 2017-11-16 NOTE — Telephone Encounter (Signed)
Pt states last night began to have "pressure" to her mid chest. She went to bed. Awoke up with intermittent "chest tightness or pressure" than became constant in the past 15 minutes. Describes it feels like "someone is sitting on my chest'. She is alone except with her 28 year old daughter.   Pt denies that pain radiates but does complain of shortness of breath and the tips of her fingers feeling cold. She states that she feels very fatigued. Pt called her mother and I advised pt to call 911. Reason for Disposition . [1] Chest pain lasts > 5 minutes AND [2] described as crushing, pressure-like, or heavy  Answer Assessment - Initial Assessment Questions 1. LOCATION: "Where does it hurt?"       Middle of chest  2. RADIATION: "Does the pain go anywhere else?" (e.g., into neck, jaw, arms, back)     no 3. ONSET: "When did the chest pain begin?" (Minutes, hours or days)      Last night  4. PATTERN "Does the pain come and go, or has it been constant since it started?"  "Does it get worse with exertion?"      Constant past 15 minutes, has not tried any exertion  5. DURATION: "How long does it last" (e.g., seconds, minutes, hours)     Constant pain 6. SEVERITY: "How bad is the pain?"  (e.g., Scale 1-10; mild, moderate, or severe)    - MILD (1-3): doesn't interfere with normal activities     - MODERATE (4-7): interferes with normal activities or awakens from sleep    - SEVERE (8-10): excruciating pain, unable to do any normal activities       8/10 7. CARDIAC RISK FACTORS: "Do you have any history of heart problems or risk factors for heart disease?" (e.g., prior heart attack, angina; high blood pressure, diabetes, being overweight, high cholesterol, smoking, or strong family history of heart disease)     Born with heart murmur 8. PULMONARY RISK FACTORS: "Do you have any history of lung disease?"  (e.g., blood clots in lung, asthma, emphysema, birth control pills)     No  9. CAUSE: "What do you think is  causing the chest pain?"     Pt is unsure  10. OTHER SYMPTOMS: "Do you have any other symptoms?" (e.g., dizziness, nausea, vomiting, sweating, fever, difficulty breathing, cough)       Feels cold at the finger tips 11. PREGNANCY: "Is there any chance you are pregnant?" "When was your last menstrual period?"       S/p tubal ligation  Protocols used: CHEST PAIN-A-AH

## 2017-11-16 NOTE — ED Notes (Signed)
Pt arrived via EMS from home for reports of chest pain that began last night. EMS reports NSR, VSS. Pt reports history of anxiety and takes buspar and prozac. EMS administered one spray NTG with no change. No apparent distress noted on arrival.

## 2017-11-24 ENCOUNTER — Emergency Department
Admission: EM | Admit: 2017-11-24 | Discharge: 2017-11-24 | Disposition: A | Discharge status: Home / Self Care | Payer: Commercial Managed Care - PPO | Attending: Emergency Medicine | Admitting: Emergency Medicine

## 2017-11-24 ENCOUNTER — Other Ambulatory Visit: Payer: Self-pay

## 2017-11-24 ENCOUNTER — Emergency Department: Payer: Commercial Managed Care - PPO

## 2017-11-24 DIAGNOSIS — W108XXA Fall (on) (from) other stairs and steps, initial encounter: Secondary | ICD-10-CM | POA: Insufficient documentation

## 2017-11-24 DIAGNOSIS — W19XXXA Unspecified fall, initial encounter: Secondary | ICD-10-CM | POA: Diagnosis not present

## 2017-11-24 DIAGNOSIS — Y939 Activity, unspecified: Secondary | ICD-10-CM | POA: Diagnosis not present

## 2017-11-24 DIAGNOSIS — Y999 Unspecified external cause status: Secondary | ICD-10-CM | POA: Diagnosis not present

## 2017-11-24 DIAGNOSIS — F17210 Nicotine dependence, cigarettes, uncomplicated: Secondary | ICD-10-CM | POA: Diagnosis not present

## 2017-11-24 DIAGNOSIS — Z79899 Other long term (current) drug therapy: Secondary | ICD-10-CM | POA: Insufficient documentation

## 2017-11-24 DIAGNOSIS — Y929 Unspecified place or not applicable: Secondary | ICD-10-CM | POA: Diagnosis not present

## 2017-11-24 DIAGNOSIS — S5001XA Contusion of right elbow, initial encounter: Secondary | ICD-10-CM | POA: Insufficient documentation

## 2017-11-24 DIAGNOSIS — S59901A Unspecified injury of right elbow, initial encounter: Secondary | ICD-10-CM | POA: Diagnosis present

## 2017-11-24 MED ORDER — HYDROCODONE-ACETAMINOPHEN 5-325 MG PO TABS
1.0000 | ORAL_TABLET | Freq: Once | ORAL | Status: AC
  Administered 2017-11-24: 1 via ORAL
  Filled 2017-11-24: qty 1

## 2017-11-24 MED ORDER — IBUPROFEN 100 MG/5ML PO SUSP
ORAL | Status: DC
Start: 2017-11-24 — End: 2017-11-24
  Filled 2017-11-24: qty 30

## 2017-11-24 MED ORDER — HYDROCODONE-ACETAMINOPHEN 5-325 MG PO TABS: 1.0000 | ORAL_TABLET | ORAL | 0 refills | Status: DC | PRN

## 2017-11-24 MED ORDER — IBUPROFEN 100 MG/5ML PO SUSP
600.0000 mg | Freq: Once | ORAL | Status: AC
  Administered 2017-11-24: 600 mg via ORAL
  Filled 2017-11-24: qty 30

## 2017-11-24 MED ORDER — IBUPROFEN 600 MG PO TABS: 600.0000 mg | ORAL_TABLET | Freq: Four times a day (QID) | ORAL | 0 refills | Status: DC | PRN

## 2017-11-24 NOTE — ED Notes (Signed)
See triage note  States she slipped down steps   Landed on right elbow  Min swelling and abrasion noted

## 2017-11-24 NOTE — ED Triage Notes (Signed)
Pt states she slipped and fell on concrete steps while carrying her child on her hip. Pt c/o right elbow pain with noted abrasion.

## 2017-11-24 NOTE — ED Provider Notes (Signed)
[facility] Emergency Department Provider Note ____________________________________________  Time seen: Approximately 2:48 PM  I have reviewed the triage vital signs and the nursing notes.   HISTORY  Chief Complaint Elbow Injury    HPI Vicki Cruz is a 26 y.o. female who presents to the emergency department for evaluation of right elbow pain.  She states that she had a mechanical, non-syncopal fall on some concrete steps while carrying her child on her right hip.  No alleviating measures have been attempted for this complaint. Past Medical History:  Diagnosis Date  . GAD (generalized anxiety disorder)   . History of miscarriage   . Migraines 05/12/2017  . Preterm labor    1st pregnancy had to get medicine at 21 weeks because of labor  . Tobacco abuse    quit: 06/06/2015    Patient Active Problem List   Diagnosis Date Noted  . GAD (generalized anxiety disorder) 05/12/2017  . Migraines 05/12/2017  . Tobacco abuse 06/11/2015    Past Surgical History:  Procedure Laterality Date  . CESAREAN SECTION  2008  . CESAREAN SECTION WITH BILATERAL TUBAL LIGATION Bilateral 02/04/2016   Procedure: REPEAT CESAREAN SECTION WITH BILATERAL TUBAL LIGATION;  Surgeon: Hildred LaserAnika Cherry, MD;  Location: [facility] ORS;  Service: Obstetrics;  Laterality: Bilateral;  . IUD REMOVAL      Prior to Admission medications   Medication Sig Start Date End Date Taking? Authorizing Provider  busPIRone (BUSPAR) 7.5 MG tablet Take 1 tablet (7.5 mg total) by mouth 2 (two) times daily. 10/08/17   Doreene Nestlark, Katherine K, NP  FLUoxetine (PROZAC) 40 MG capsule Take 1 capsule (40 mg total) by mouth daily. 10/15/17   Doreene Nestlark, Katherine K, NP  guaiFENesin-codeine 100-10 MG/5ML syrup Take 5 mLs by mouth 3 (three) times daily as needed for cough. 11/16/17   Schaevitz, Myra Rudeavid Matthew, MD  HYDROcodone-acetaminophen (NORCO/VICODIN) 5-325 MG tablet Take 1 tablet by mouth every 4 (four) hours as needed for moderate  pain. 11/24/17 11/24/18  Caleigh Rabelo, Kasandra Knudsenari B, FNP  ibuprofen (ADVIL,MOTRIN) 600 MG tablet Take 1 tablet (600 mg total) by mouth every 6 (six) hours as needed. 11/24/17   Koriana Stepien, Rulon Eisenmengerari B, FNP  SUMAtriptan (IMITREX) 50 MG tablet Take 1 tablet (50 mg total) by mouth every 2 (two) hours as needed for migraine. Can take up to 2 tablets every 2 hours as needed. Patient not taking: Reported on 05/12/2017 03/11/16   Hildred Laserherry, Anika, MD    Allergies Penicillin g and Penicillins  Family History  Problem Relation Age of Onset  . Cancer Paternal Grandfather        unsure of what type    Social History Social History   Tobacco Use  . Smoking status: Current Every Day Smoker    Types: Cigarettes    Last attempt to quit: 05/16/2015    Years since quitting: 2.5  . Smokeless tobacco: Never Used  Substance Use Topics  . Alcohol use: No    Alcohol/week: 0.0 oz  . Drug use: No    Review of Systems Constitutional: Negative for recent illness. Cardiovascular: Negative for chest pain Respiratory: Negative for shortness of breath Musculoskeletal: Positive for right elbow pain Skin: Positive for abrasion over the right elbow Neurological: Negative for paresthesias  ____________________________________________   PHYSICAL EXAM:  VITAL SIGNS: ED Triage Vitals  Enc Vitals Group     BP 11/24/17 1329 126/77     Pulse Rate 11/24/17 1329 94     Resp 11/24/17 1329 18  Temp 11/24/17 1329 98.7 F (37.1 C)     Temp Source 11/24/17 1329 Oral     SpO2 11/24/17 1329 99 %     Weight 11/24/17 1328 140 lb (63.5 kg)     Height 11/24/17 1328 5\' 4"  (1.626 m)     Head Circumference --      Peak Flow --      Pain Score 11/24/17 1328 10     Pain Loc --      Pain Edu? --      Excl. in GC? --     Constitutional: Alert and oriented. Well appearing and in no acute distress. Eyes: Conjunctivae are clear without discharge or drainage Head: Atraumatic Neck: Nexus criteria is negative Respiratory: Even and  unlabored Musculoskeletal: Limited extension of the right elbow secondary to pain.  Patient has diffuse tenderness over the olecranon process.   Neurologic:Sharp and dull sensation is intact over the right upper extremity. Skin: Abrasion present over the olecranon Psychiatric: Affect and behavior is normal  ____________________________________________   LABS (all labs ordered are listed, but only abnormal results are displayed)  Labs Reviewed - No data to display ____________________________________________  RADIOLOGY  Right elbow image is negative for acute bony abnormality per radiology. I, Kem Boroughsari Refael Fulop, personally viewed and evaluated these images (plain radiographs) as part of my medical decision making, as well as reviewing the written report by the radiologist.  ___________________________________________   PROCEDURES  Procedures  ____________________________________________   INITIAL IMPRESSION / ASSESSMENT AND PLAN / ED COURSE  Vicki Cruz is a 26 y.o. female who presented to the emergency department for evaluation and treatment of right elbow pain.  X-rays negative for acute bony abnormality, however due to significant pain and difficulty extending at the elbow she was placed in a sling.  She was advised to gradually begin to go through range of motion exercises with the goal of being able to fully extend the arm.  She was advised that if she is unable to do so after 1 week that she will need to follow-up either with her primary care provider or with orthopedics.  She was given a prescription for Norco and ibuprofen.  She was advised to return to the emergency department for symptoms of change or worsen if she is unable to schedule appointment.  Medications  HYDROcodone-acetaminophen (NORCO/VICODIN) 5-325 MG per tablet 1 tablet (1 tablet Oral Given 11/24/17 1511)  ibuprofen (ADVIL,MOTRIN) 100 MG/5ML suspension 600 mg (600 mg Oral Given 11/24/17 1511)    Pertinent  labs & imaging results that were available during my care of the patient were reviewed by me and considered in my medical decision making (see chart for details).  _________________________________________   FINAL CLINICAL IMPRESSION(S) / ED DIAGNOSES  Final diagnoses:  Contusion of right elbow, initial encounter    ED Discharge Orders        Ordered    HYDROcodone-acetaminophen (NORCO/VICODIN) 5-325 MG tablet  Every 4 hours PRN     11/24/17 1459    ibuprofen (ADVIL,MOTRIN) 600 MG tablet  Every 6 hours PRN     11/24/17 1459       If controlled substance prescribed during this visit, 12 month history viewed on the [facility] prior to issuing an initial prescription for Schedule II or III opiod.    Chinita Pesterriplett, Kenley Troop B, FNP 11/24/17 1554    Governor RooksLord, Rebecca, MD 11/25/17 [phone]

## 2017-11-24 NOTE — Discharge Instructions (Signed)
Please follow up with orthopedics for pain that is not improving over the next week. Gradually start to try and fully extend the elbow during that time. Take the medication as prescribed. Return to the ER for symptoms that change or worsen if unable to schedule an appointment with primary care or orthopedics.

## 2017-12-22 ENCOUNTER — Encounter: Payer: Self-pay | Admitting: Primary Care

## 2017-12-28 ENCOUNTER — Ambulatory Visit: Payer: Self-pay | Admitting: Primary Care

## 2017-12-30 ENCOUNTER — Ambulatory Visit: Payer: Commercial Managed Care - PPO | Admitting: Primary Care

## 2017-12-30 ENCOUNTER — Encounter: Payer: Self-pay | Admitting: Primary Care

## 2017-12-30 DIAGNOSIS — F419 Anxiety disorder, unspecified: Secondary | ICD-10-CM

## 2017-12-30 DIAGNOSIS — F411 Generalized anxiety disorder: Secondary | ICD-10-CM | POA: Diagnosis not present

## 2017-12-30 DIAGNOSIS — F329 Major depressive disorder, single episode, unspecified: Secondary | ICD-10-CM | POA: Diagnosis not present

## 2017-12-30 DIAGNOSIS — F32A Depression, unspecified: Secondary | ICD-10-CM

## 2017-12-30 MED ORDER — HYDROXYZINE HCL 10 MG PO TABS: 10.0000 mg | ORAL_TABLET | Freq: Two times a day (BID) | ORAL | 0 refills | Status: DC | PRN

## 2017-12-30 MED ORDER — VENLAFAXINE HCL ER 37.5 MG PO CP24: 37.5000 mg | ORAL_CAPSULE | Freq: Every day | ORAL | 1 refills | Status: DC

## 2017-12-30 NOTE — Progress Notes (Signed)
Subjective:    Patient ID: Vicki Cruz, female    DOB: 04/22/1991, 27 y.o.   MRN: [id number]  HPI  Vicki Cruz is a 27 year old female with a history of anxiety disorder who presents today with a chief complaint of anxiety.  She is currently managed on fluoxetine 40 mg and buspirone 7.5 mg BID. Her fluoxetine was increased from 20 mg to 40 mg in September 2018 due to little improvement in anxiety symptoms. She sent a message through the patient portal in October 2018 reporting 3-4 panic/anxiety attacks daily and requesting Xanax so she was prescribed buspirone 7.5 mg BID.   Since initiation of Buspar she feels no improvement. She's still experiencing numerous panic attacks daily, daily worry, daily anxiety. She's not had her Buspar in three weeks as her dose is on backorder and the pharmacy has not contacted her.  PHQ 9 score of 8 and GAD 7 score of 21 today. She denies SI/HI.  Review of Systems  Respiratory: Negative for shortness of breath.   Cardiovascular: Negative for chest pain.  Gastrointestinal: Negative for nausea.  Neurological: Negative for headaches.  Psychiatric/Behavioral: Negative for suicidal ideas.       See HPI       Past Medical History:  Diagnosis Date  . GAD (generalized anxiety disorder)   . History of miscarriage   . Migraines 05/12/2017  . Preterm labor    1st pregnancy had to get medicine at 21 weeks because of labor  . Tobacco abuse    quit: 06/06/2015     Social History   Socioeconomic History  . Marital status: Married    Spouse name: Not on file  . Number of children: 1  . Years of education: Not on file  . Highest education level: Not on file  Social Needs  . Financial resource strain: Not on file  . Food insecurity - worry: Not on file  . Food insecurity - inability: Not on file  . Transportation needs - medical: Not on file  . Transportation needs - non-medical: Not on file  Occupational History  . 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 Smoking status: Current Every Day Smoker    Types: Cigarettes    Last attempt to quit: 05/16/2015    Years since quitting: 2.6  . Smokeless tobacco: Never Used  Substance and Sexual Activity  . Alcohol use: No    Alcohol/week: 0.0 oz  . Drug use: No  . Sexual activity: Yes    Partners: Male    Birth control/protection: Surgical  Other Topics Concern  . Not on file  Social History Narrative  . Not on file    Past Surgical History:  Procedure Laterality Date  . CESAREAN SECTION  2008  . CESAREAN SECTION WITH BILATERAL TUBAL LIGATION Bilateral 02/04/2016   Procedure: REPEAT CESAREAN SECTION WITH BILATERAL TUBAL LIGATION;  Surgeon: Hildred LaserAnika Cherry, MD;  Location: [facility] ORS;  Service: Obstetrics;  Laterality: Bilateral;  . IUD REMOVAL      Family History  Problem Relation Age of Onset  . Cancer Paternal Grandfather        unsure of what type    Allergies  Allergen Reactions  . Penicillin G Rash  . Penicillins Nausea And Vomiting, Rash and Other (See Comments)    Reaction:  Fever  Has patient had a PCN reaction causing immediate rash, facial/tongue/throat swelling, SOB or lightheadedness with hypotension: No Has patient had a PCN reaction causing severe rash involving  mucus membranes or skin necrosis: No Has patient had a PCN reaction that required hospitalization No Has patient had a PCN reaction occurring within the last 10 years: No If all of the above answers are "NO", then may proceed with Cephalosporin use.    Current Outpatient Medications on File Prior to Visit  Medication Sig Dispense Refill  . SUMAtriptan (IMITREX) 50 MG tablet Take 1 tablet (50 mg total) by mouth every 2 (two) hours as needed for migraine. Can take up to 2 tablets every 2 hours as needed. (Patient not taking: Reported on 05/12/2017) 30 tablet 3   No current facility-administered medications on file prior to visit.     BP 114/66   Pulse 82   Temp 98.8 F (37.1 C) (Oral)   Ht 5' 4.25" (1.632 m)    Wt 161 lb (73 kg)   LMP 12/24/2017   SpO2 98%   BMI 27.42 kg/m    Objective:   Physical Exam  Constitutional: She appears well-nourished.  Neck: Neck supple.  Cardiovascular: Normal rate and regular rhythm.  Pulmonary/Chest: Effort normal and breath sounds normal.  Skin: Skin is warm and dry.  Psychiatric: She has a normal mood and affect.          Assessment & Plan:

## 2017-12-30 NOTE — Patient Instructions (Signed)
Stop fluoxetine 40 mg capsules, start venlafaxine 37.5 mg capsules. Take 1 capsule by mouth every morning with food.  You may take the hydroxyzine 10 mg tablets twice daily as needed for anxiety. Caution as it may cause drowsiness.  Schedule a follow up visit with me in 4 weeks.   It was a pleasure to see you today!

## 2018-01-26 ENCOUNTER — Encounter: Payer: Self-pay | Admitting: Primary Care

## 2018-01-26 DIAGNOSIS — F411 Generalized anxiety disorder: Secondary | ICD-10-CM

## 2018-01-26 NOTE — Assessment & Plan Note (Signed)
No improvement with increased dose of Fluoxetine, Buspar on back order. Wean off of Fluoxetine, switch to Effexor 37.5 mg. Rx for hydroxyzine provided to use PRN anxiety/panic attacks.  She will update through My Chart in 4 weeks. Denies SI/HI.

## 2018-01-29 MED ORDER — HYDROXYZINE HCL 25 MG PO TABS: 25.0000 mg | ORAL_TABLET | Freq: Two times a day (BID) | ORAL | 0 refills | Status: DC | PRN

## 2018-02-19 ENCOUNTER — Encounter: Payer: Self-pay | Admitting: Primary Care

## 2018-02-19 ENCOUNTER — Ambulatory Visit: Payer: Commercial Managed Care - PPO | Admitting: Primary Care

## 2018-02-19 VITALS — BP 122/76 | HR 111 | Temp 99.4°F | Ht 64.25 in | Wt 164.8 lb

## 2018-02-19 DIAGNOSIS — R6889 Other general symptoms and signs: Secondary | ICD-10-CM

## 2018-02-19 DIAGNOSIS — N926 Irregular menstruation, unspecified: Secondary | ICD-10-CM

## 2018-02-19 LAB — POC INFLUENZA A&B (BINAX/QUICKVUE)
Influenza A, POC: NEGATIVE
Influenza B, POC: NEGATIVE

## 2018-02-19 LAB — POCT RAPID STREP A (OFFICE): Rapid Strep A Screen: NEGATIVE

## 2018-02-19 LAB — HCG, QUANTITATIVE, PREGNANCY: QUANTITATIVE HCG: 0.04 m[IU]/mL

## 2018-02-19 NOTE — Addendum Note (Signed)
Addended by: Tawnya CrookSAMBATH, Steffanie Mingle on: 02/19/2018 10:13 AM   Modules accepted: Orders

## 2018-02-19 NOTE — Patient Instructions (Addendum)
Stop by the lab prior to leaving today. I will notify you of your results once received.   Start taking Tylenol as needed for body aches, fevers, chills.  You can try Delsym or Robitussin for cough.   Nasal Congestion/Ear Pressure: Try using Flonase (fluticasone) nasal spray. Instill 1 spray in each nostril twice daily.   You are negative for strep and flu.   Make sure to drink plenty of fluids and rest.   It was a pleasure to see you today!

## 2018-02-19 NOTE — Progress Notes (Signed)
Subjective:    Patient ID: Vicki Cruz, female    DOB: 1991/03/01, 27 y.o.   MRN: [id number]  HPI  Vicki Cruz is a 27 year old female who presents today with a chief complaint of body aches. She also reports nausea, vomiting, dizziness, fevers, cough. She's also not had a menstrual cycle since December 24, 2017.   Her symptoms hit suddenly yesterday in the afternoon. Her fevers are running 99-100 on average. She's dry heaving mostly, and has done this twice total. She denies diarrhea. Her husband has had "cold symptoms" without fevers or body aches. She's taken Mucinex Cold and Flu last night without improvement.   LMP was December 24, 2017. She's never missed a menstrual cycle in the past. She is not on contraception. She reports breast tenderness, and abdominal cramping as though her period will start, but has not. She's taken two home pregnancy tests of which were negative.   Review of Systems  Constitutional: Positive for chills, fatigue and fever.  HENT: Positive for sore throat. Negative for congestion.   Respiratory: Positive for cough.   Musculoskeletal: Positive for myalgias.       Past Medical History:  Diagnosis Date  . GAD (generalized anxiety disorder)   . History of miscarriage   . Migraines 05/12/2017  . Preterm labor    1st pregnancy had to get medicine at 21 weeks because of labor  . Tobacco abuse    quit: 06/06/2015     Social History   Socioeconomic History  . Marital status: Married    Spouse name: Not on file  . Number of children: 1  . Years of education: Not on file  . Highest education level: Not on file  Social Needs  . Financial resource strain: Not on file  . Food insecurity - worry: Not on file  . Food insecurity - inability: Not on file  . Transportation needs - medical: Not on file  . Transportation needs - non-medical: Not on file  Occupational History  . Occupation: Airline pilot  Tobacco Use  . Smoking status: Current Every Day Smoker   Types: Cigarettes    Last attempt to quit: 05/16/2015    Years since quitting: 2.7  . Smokeless tobacco: Never Used  Substance and Sexual Activity  . Alcohol use: No    Alcohol/week: 0.0 oz  . Drug use: No  . Sexual activity: Yes    Partners: Male    Birth control/protection: Surgical  Other Topics Concern  . Not on file  Social History Narrative  . Not on file    Past Surgical History:  Procedure Laterality Date  . CESAREAN SECTION  2008  . CESAREAN SECTION WITH BILATERAL TUBAL LIGATION Bilateral 02/04/2016   Procedure: REPEAT CESAREAN SECTION WITH BILATERAL TUBAL LIGATION;  Surgeon: Hildred Laser, MD;  Location: [facility] ORS;  Service: Obstetrics;  Laterality: Bilateral;  . IUD REMOVAL      Family History  Problem Relation Age of Onset  . Cancer Paternal Grandfather        unsure of what type    Allergies  Allergen Reactions  . Penicillin G Rash  . Penicillins Nausea And Vomiting, Rash and Other (See Comments)    Reaction:  Fever  Has patient had a PCN reaction causing immediate rash, facial/tongue/throat swelling, SOB or lightheadedness with hypotension: No Has patient had a PCN reaction causing severe rash involving mucus membranes or skin necrosis: No Has patient had a PCN reaction that required hospitalization No Has patient  had a PCN reaction occurring within the last 10 years: No If all of the above answers are "NO", then may proceed with Cephalosporin use.    Current Outpatient Medications on File Prior to Visit  Medication Sig Dispense Refill  . hydrOXYzine (ATARAX/VISTARIL) 25 MG tablet Take 1 tablet (25 mg total) by mouth 2 (two) times daily as needed for anxiety. 30 tablet 0  . venlafaxine XR (EFFEXOR XR) 37.5 MG 24 hr capsule Take 1 capsule (37.5 mg total) by mouth daily with breakfast. 30 capsule 1  . SUMAtriptan (IMITREX) 50 MG tablet Take 1 tablet (50 mg total) by mouth every 2 (two) hours as needed for migraine. Can take up to 2 tablets every 2 hours as  needed. (Patient not taking: Reported on 05/12/2017) 30 tablet 3   No current facility-administered medications on file prior to visit.     BP 122/76   Pulse (!) 111   Temp 99.4 F (37.4 C) (Oral)   Ht 5' 4.25" (1.632 m)   Wt 164 lb 12 oz (74.7 kg)   LMP 12/24/2017   SpO2 98%   BMI 28.06 kg/m    Objective:   Physical Exam  Constitutional: She appears well-nourished. She appears ill.  HENT:  Right Ear: Ear canal normal. Tympanic membrane is bulging. Tympanic membrane is not erythematous.  Left Ear: Tympanic membrane and ear canal normal. Tympanic membrane is not erythematous.  Nose: Right sinus exhibits no maxillary sinus tenderness and no frontal sinus tenderness. Left sinus exhibits no maxillary sinus tenderness and no frontal sinus tenderness.  Mouth/Throat: Oropharynx is clear and moist.  Tenderness to anterior neck distal to mandible bilaterally. No swelling.    Eyes: Conjunctivae are normal.  Neck: Neck supple.  Cardiovascular: Normal rate and regular rhythm.  Pulmonary/Chest: Effort normal and breath sounds normal. She has no wheezes. She has no rales.  Lymphadenopathy:    She has no cervical adenopathy.  Skin: Skin is warm and dry.          Assessment & Plan:  URI:  Also with low grade fevers, chills, mild cough, sore throat. Symptoms hit suddenly yesterday. No improvement with OTC treatment. Rapid Flu: Negative Rapid Strep: Negative Suspect viral involvement and will treat with conservative measures. Flonase, Delsym, tylenol.  Return precautions provided.  Doreene NestKatherine K Remedy Corporan, NP   Missed Menstrual Cycle:   LMP 12/24/17. Negative urine pregnancy tests x 2. Is not on contraceptives, is sexually active. Check HCG quantitative. If negative then recommend additional time and to notify if no menstrual cycle in March 2019.  Doreene NestKatherine K Rodolph Hagemann, NP

## 2018-03-10 ENCOUNTER — Other Ambulatory Visit: Payer: Self-pay | Admitting: Primary Care

## 2018-03-10 DIAGNOSIS — F411 Generalized anxiety disorder: Secondary | ICD-10-CM

## 2018-03-10 MED ORDER — VENLAFAXINE HCL ER 37.5 MG PO CP24: 37.5000 mg | ORAL_CAPSULE | Freq: Every day | ORAL | 1 refills | Status: DC

## 2018-03-10 NOTE — Telephone Encounter (Signed)
Copied from CRM [id number]. Topic: Quick Communication - Rx Refill/Question >> Mar 10, 2018  2:41 PM Maia PettiesOrtiz, Kristie S wrote: Medication: venlafaxine XR (EFFEXOR XR) 37.5 MG 24 hr capsule  - taking 1 day - has 5 left Has the patient contacted their pharmacy? No - didn't know she had to, no refills left (Agent: If no, request that the patient contact the pharmacy for the refill.) Preferred Pharmacy (with phone number or street name): [address] [place] [address] [phone] (Phone) [phone] (Fax)

## 2018-03-25 ENCOUNTER — Ambulatory Visit: Payer: Commercial Managed Care - PPO | Admitting: Family Medicine

## 2018-03-25 ENCOUNTER — Telehealth: Payer: Self-pay | Admitting: Primary Care

## 2018-03-25 ENCOUNTER — Encounter: Payer: Self-pay | Admitting: Family Medicine

## 2018-03-25 VITALS — BP 124/76 | HR 93 | Temp 98.3°F | Ht 64.25 in | Wt 163.8 lb

## 2018-03-25 DIAGNOSIS — H6591 Unspecified nonsuppurative otitis media, right ear: Secondary | ICD-10-CM | POA: Diagnosis not present

## 2018-03-25 MED ORDER — NEOMYCIN-POLYMYXIN-HC 1 % OT SOLN: 3.0000 [drp] | Freq: Three times a day (TID) | OTIC | 0 refills | Status: DC

## 2018-03-25 MED ORDER — FLUTICASONE PROPIONATE 50 MCG/ACT NA SUSP: 2.0000 | Freq: Every day | NASAL | 1 refills | Status: DC

## 2018-03-25 MED ORDER — ANTIPYRINE-BENZOCAINE 5.4-1.4 % OT SOLN: 3.0000 [drp] | OTIC | 0 refills | Status: DC | PRN

## 2018-03-25 MED ORDER — AZITHROMYCIN 250 MG PO TABS: ORAL_TABLET | ORAL | 0 refills | Status: DC

## 2018-03-25 NOTE — Telephone Encounter (Addendum)
Will forward to treating MD.

## 2018-03-25 NOTE — Patient Instructions (Signed)
I think you have an early ear infection  Continue ibuprofen for pain  Use warm or cool compress on ear if you want to  Try the auralgan drops for pain Take zpak for bacterial infection  flonase to open ear tube - at least 2 weeks   Update if not starting to improve in a week or if worsening

## 2018-03-25 NOTE — Assessment & Plan Note (Addendum)
Early R OM with effusion  zpak px - (pcn all) Auralgan for pain  Ibuprofen prn  flonase daily for ETD Disc symptomatic care - see instructions on AVS  Update if not starting to improve in a week or if worsening   Enc to quit smoking

## 2018-03-25 NOTE — Addendum Note (Signed)
Addended by: Roxy MannsWER, Herbie Lehrmann A on: 03/25/2018 07:22 PM   Modules accepted: Orders

## 2018-03-25 NOTE — Telephone Encounter (Signed)
I sent the cortisporin drops

## 2018-03-25 NOTE — Telephone Encounter (Signed)
See pharmacy request regarding Auralgan OTIC solution  Pt of Mayra ReelKate Clark, NP

## 2018-03-25 NOTE — Progress Notes (Signed)
Subjective:    Patient ID: Vicki Cruz, female    DOB: 04/15/1991, 27 y.o.   MRN: [id number]  HPI Here for R ear pain for 3 days   Right ear  Sharp pain - feels like a jabbing pain  It occ radiates down her jaw -that is improved now  Has a headache-on the R side   No nasal congestion  No fever   Smokes a cig once in a blue moon   No trauma to ear  No altitude changes Does clean with q tips  No hx of wax problems  No recent swimming    Wt Readings from Last 3 Encounters:  03/25/18 163 lb 12 oz (74.3 kg)  02/19/18 164 lb 12 oz (74.7 kg)  12/30/17 161 lb (73 kg)    Patient Active Problem List   Diagnosis Date Noted  . OME (otitis media with effusion), right 03/25/2018  . GAD (generalized anxiety disorder) 05/12/2017  . Migraines 05/12/2017  . Tobacco abuse 06/11/2015   Past Medical History:  Diagnosis Date  . GAD (generalized anxiety disorder)   . History of miscarriage   . Migraines 05/12/2017  . Preterm labor    1st pregnancy had to get medicine at 21 weeks because of labor  . Tobacco abuse    quit: 06/06/2015   Past Surgical History:  Procedure Laterality Date  . CESAREAN SECTION  2008  . CESAREAN SECTION WITH BILATERAL TUBAL LIGATION Bilateral 02/04/2016   Procedure: REPEAT CESAREAN SECTION WITH BILATERAL TUBAL LIGATION;  Surgeon: Hildred LaserAnika Cherry, MD;  Location: [facility] ORS;  Service: Obstetrics;  Laterality: Bilateral;  . IUD REMOVAL     Social History   Tobacco Use  . Smoking status: Current Some Day Smoker    Types: Cigarettes  . Smokeless tobacco: Never Used  . Tobacco comment: occ cigarette  Substance Use Topics  . Alcohol use: No    Alcohol/week: 0.0 oz  . Drug use: No   Family History  Problem Relation Age of Onset  . Cancer Paternal Grandfather        unsure of what type   Allergies  Allergen Reactions  . Penicillin G Rash  . Penicillins Nausea And Vomiting, Rash and Other (See Comments)    Reaction:  Fever  Has patient had a PCN  reaction causing immediate rash, facial/tongue/throat swelling, SOB or lightheadedness with hypotension: No Has patient had a PCN reaction causing severe rash involving mucus membranes or skin necrosis: No Has patient had a PCN reaction that required hospitalization No Has patient had a PCN reaction occurring within the last 10 years: No If all of the above answers are "NO", then may proceed with Cephalosporin use.   Current Outpatient Medications on File Prior to Visit  Medication Sig Dispense Refill  . hydrOXYzine (ATARAX/VISTARIL) 25 MG tablet Take 1 tablet (25 mg total) by mouth 2 (two) times daily as needed for anxiety. 30 tablet 0  . venlafaxine XR (EFFEXOR XR) 37.5 MG 24 hr capsule Take 1 capsule (37.5 mg total) by mouth daily with breakfast. 30 capsule 1   No current facility-administered medications on file prior to visit.      Review of Systems  Constitutional: Negative for activity change, appetite change, fatigue, fever and unexpected weight change.  HENT: Positive for ear pain. Negative for congestion, ear discharge, facial swelling, hearing loss, postnasal drip, rhinorrhea, sinus pressure and sore throat.   Eyes: Negative for pain, redness and visual disturbance.  Respiratory: Negative for cough, shortness  of breath and wheezing.   Cardiovascular: Negative for chest pain and palpitations.  Gastrointestinal: Negative for abdominal pain, blood in stool, constipation and diarrhea.  Endocrine: Negative for polydipsia and polyuria.  Genitourinary: Negative for dysuria, frequency and urgency.  Musculoskeletal: Negative for arthralgias, back pain and myalgias.  Skin: Negative for pallor and rash.  Allergic/Immunologic: Negative for environmental allergies.  Neurological: Negative for dizziness, syncope and headaches.  Hematological: Negative for adenopathy. Does not bruise/bleed easily.  Psychiatric/Behavioral: Negative for decreased concentration and dysphoric mood. The patient is  not nervous/anxious.        Objective:   Physical Exam  Constitutional: She appears well-developed and well-nourished. No distress.  Well appearing but uncomfortable with ear pain   HENT:  Head: Normocephalic and atraumatic.  Left Ear: External ear normal.  Mouth/Throat: Oropharynx is clear and moist. No oropharyngeal exudate.  Nares are boggy and slightly injected  No sinus tenderness   R TM- dull/effusion and slt erythema  Not bulging  Nl L TM   Both canals clear  No external ear redness or swelling   Eyes: Pupils are equal, round, and reactive to light. Conjunctivae and EOM are normal. Right eye exhibits no discharge. Left eye exhibits no discharge. No scleral icterus.  Neck: Normal range of motion. Neck supple.  Cardiovascular: Normal rate, regular rhythm and normal heart sounds.  Pulmonary/Chest: Effort normal and breath sounds normal. No respiratory distress. She has no wheezes. She has no rales.  Lymphadenopathy:    She has no cervical adenopathy.  Neurological: She is alert. No cranial nerve deficit.  Skin: Skin is warm and dry. No rash noted. No erythema. No pallor.  Psychiatric: She has a normal mood and affect.          Assessment & Plan:   Problem List Items Addressed This Visit      Nervous and Auditory   OME (otitis media with effusion), right - Primary    Early R OM with effusion  zpak px - (pcn all) Auralgan for pain  Ibuprofen prn  flonase daily for ETD Disc symptomatic care - see instructions on AVS  Update if not starting to improve in a week or if worsening   Enc to quit smoking       Relevant Medications   azithromycin (ZITHROMAX Z-PAK) 250 MG tablet

## 2018-03-25 NOTE — Telephone Encounter (Signed)
Copied from CRM [id number]. Topic: Quick Communication - Rx Refill/Question >> Mar 25, 2018 12:03 PM Louie BunPalacios Medina, Rosey Batheresa D wrote: Medication: antipyrine-benzocaine Lyla Son(AURALGAN) OTIC solution was sent over but its not available. They have cortisporin to give patient if provider would like. Please fax new rx or call . Has the patient contacted their pharmacy?Yes (Agent: If no, request that the patient contact the pharmacy for the refill.) Preferred Pharmacy (with phone number or street name): [facility] - [place], [place] - [address] Agent: Please be advised that RX refills may take up to 3 business days. We ask that you follow-up with your pharmacy.

## 2018-04-12 ENCOUNTER — Encounter (INDEPENDENT_AMBULATORY_CARE_PROVIDER_SITE_OTHER): Payer: Self-pay

## 2018-04-12 ENCOUNTER — Telehealth: Payer: Self-pay | Admitting: Primary Care

## 2018-04-12 ENCOUNTER — Encounter: Payer: Self-pay | Admitting: Primary Care

## 2018-04-12 NOTE — Telephone Encounter (Signed)
(  Patient recently treated with Zpak for an ear infection) Please notify patient that we are always happy to see her in the office for further evaluation, please schedule her at her convenience.

## 2018-04-12 NOTE — Telephone Encounter (Signed)
Patient sent a MyChart message. I have sent a reply to the message that she need to come in.

## 2018-04-12 NOTE — Telephone Encounter (Signed)
Will offer OTC advice via My Chart.

## 2018-04-12 NOTE — Telephone Encounter (Signed)
Message from Maryjo Rochester to Doreene Nest, NP sent at 04/12/2018 2:53 PM -----    I'm sorry. I just don't have the co pay to come in.

## 2018-04-12 NOTE — Telephone Encounter (Signed)
Copied from CRM [id number]. Topic: Quick Communication - See Telephone Encounter >> Apr 12, 2018 10:12 AM Eston Mould B wrote: CRM for notification. See Telephone encounter for: 04/12/18.  Pt is requesting a zpac  rx for her allergies and sinuses . Otc medication is not working   [facility] - [place], [place] - [address] [phone] (Phone) [phone] (Fax)

## 2018-05-14 ENCOUNTER — Ambulatory Visit: Payer: Self-pay | Admitting: *Deleted

## 2018-05-14 ENCOUNTER — Emergency Department: Payer: Commercial Managed Care - PPO

## 2018-05-14 ENCOUNTER — Encounter: Payer: Self-pay | Admitting: Emergency Medicine

## 2018-05-14 ENCOUNTER — Emergency Department
Admission: EM | Admit: 2018-05-14 | Discharge: 2018-05-14 | Disposition: A | Discharge status: Home / Self Care | Payer: Commercial Managed Care - PPO | Attending: Emergency Medicine | Admitting: Emergency Medicine

## 2018-05-14 ENCOUNTER — Ambulatory Visit: Payer: Commercial Managed Care - PPO | Admitting: Family Medicine

## 2018-05-14 ENCOUNTER — Other Ambulatory Visit: Payer: Self-pay

## 2018-05-14 DIAGNOSIS — M545 Low back pain: Secondary | ICD-10-CM | POA: Diagnosis present

## 2018-05-14 DIAGNOSIS — M549 Dorsalgia, unspecified: Secondary | ICD-10-CM | POA: Insufficient documentation

## 2018-05-14 DIAGNOSIS — R112 Nausea with vomiting, unspecified: Secondary | ICD-10-CM | POA: Insufficient documentation

## 2018-05-14 DIAGNOSIS — R1031 Right lower quadrant pain: Secondary | ICD-10-CM | POA: Insufficient documentation

## 2018-05-14 DIAGNOSIS — F17210 Nicotine dependence, cigarettes, uncomplicated: Secondary | ICD-10-CM | POA: Diagnosis not present

## 2018-05-14 DIAGNOSIS — R197 Diarrhea, unspecified: Secondary | ICD-10-CM | POA: Diagnosis not present

## 2018-05-14 DIAGNOSIS — R1032 Left lower quadrant pain: Secondary | ICD-10-CM | POA: Insufficient documentation

## 2018-05-14 DIAGNOSIS — Z79899 Other long term (current) drug therapy: Secondary | ICD-10-CM | POA: Insufficient documentation

## 2018-05-14 LAB — COMPREHENSIVE METABOLIC PANEL
ALBUMIN: 4.6 g/dL (ref 3.5–5.0)
ALK PHOS: 69 U/L (ref 38–126)
ALT: 29 U/L (ref 14–54)
ANION GAP: 10 (ref 5–15)
AST: 28 U/L (ref 15–41)
BILIRUBIN TOTAL: 0.6 mg/dL (ref 0.3–1.2)
BUN: 19 mg/dL (ref 6–20)
CALCIUM: 9.2 mg/dL (ref 8.9–10.3)
CO2: 22 mmol/L (ref 22–32)
Chloride: 104 mmol/L (ref 101–111)
Creatinine, Ser: 0.6 mg/dL (ref 0.44–1.00)
GFR calc Af Amer: >60 mL/min (ref >60)
GLUCOSE: 101 mg/dL — AB (ref 65–99)
POTASSIUM: 4.1 mmol/L (ref 3.5–5.1)
Sodium: 136 mmol/L (ref 135–145)
TOTAL PROTEIN: 8.1 g/dL (ref 6.5–8.1)

## 2018-05-14 LAB — URINALYSIS, COMPLETE (UACMP) WITH MICROSCOPIC
Bacteria, UA: NONE SEEN
Bilirubin Urine: NEGATIVE
Glucose, UA: NEGATIVE mg/dL
HGB URINE DIPSTICK: NEGATIVE
Ketones, ur: NEGATIVE mg/dL
LEUKOCYTES UA: NEGATIVE
NITRITE: NEGATIVE
Protein, ur: NEGATIVE mg/dL
SPECIFIC GRAVITY, URINE: 1.025 (ref 1.005–1.030)
pH: 5 (ref 5.0–8.0)

## 2018-05-14 LAB — CBC
HEMATOCRIT: 43.3 % (ref 35.0–47.0)
HEMOGLOBIN: 14.3 g/dL (ref 12.0–16.0)
MCH: 26.8 pg (ref 26.0–34.0)
MCHC: 33.1 g/dL (ref 32.0–36.0)
MCV: 80.9 fL (ref 80.0–100.0)
Platelets: 174 10*3/uL (ref 150–440)
RBC: 5.36 MIL/uL — ABNORMAL HIGH (ref 3.80–5.20)
RDW: 15.4 % — AB (ref 11.5–14.5)
WBC: 9.8 10*3/uL (ref 3.6–11.0)

## 2018-05-14 LAB — LIPASE, BLOOD: Lipase: 30 U/L (ref 11–51)

## 2018-05-14 LAB — POCT PREGNANCY, URINE: Preg Test, Ur: NEGATIVE

## 2018-05-14 MED ORDER — IOPAMIDOL (ISOVUE-300) INJECTION 61%
100.0000 mL | Freq: Once | INTRAVENOUS | Status: AC | PRN
  Administered 2018-05-14: 100 mL via INTRAVENOUS

## 2018-05-14 MED ORDER — KETOROLAC TROMETHAMINE 30 MG/ML IJ SOLN
30.0000 mg | Freq: Once | INTRAMUSCULAR | Status: AC
  Administered 2018-05-14: 30 mg via INTRAVENOUS
  Filled 2018-05-14: qty 1

## 2018-05-14 MED ORDER — DICYCLOMINE HCL 10 MG PO CAPS: 10.0000 mg | ORAL_CAPSULE | Freq: Three times a day (TID) | ORAL | 0 refills | Status: DC | PRN

## 2018-05-14 MED ORDER — SODIUM CHLORIDE 0.9 % IV BOLUS
1000.0000 mL | Freq: Once | INTRAVENOUS | Status: AC
  Administered 2018-05-14: 1000 mL via INTRAVENOUS

## 2018-05-14 MED ORDER — ONDANSETRON HCL 8 MG PO TABS: 8.0000 mg | ORAL_TABLET | Freq: Three times a day (TID) | ORAL | 0 refills | Status: DC | PRN

## 2018-05-14 MED ORDER — MORPHINE SULFATE (PF) 4 MG/ML IV SOLN
4.0000 mg | Freq: Once | INTRAVENOUS | Status: AC
  Administered 2018-05-14: 4 mg via INTRAVENOUS
  Filled 2018-05-14: qty 1

## 2018-05-14 MED ORDER — ONDANSETRON HCL 4 MG/2ML IJ SOLN
4.0000 mg | Freq: Once | INTRAMUSCULAR | Status: AC
  Administered 2018-05-14: 4 mg via INTRAVENOUS
  Filled 2018-05-14: qty 2

## 2018-05-14 NOTE — ED Notes (Signed)
Vital signs stable. 

## 2018-05-14 NOTE — ED Notes (Signed)
ED Provider at bedside. 

## 2018-05-14 NOTE — Discharge Instructions (Addendum)
You may use the prescribed medication as needed for nausea and abdominal or back cramping.  You can also take over-the-counter ibuprofen or Tylenol.  Return to the ER for new, worsening, persistent severe back pain, persistent vomiting or inability to take the medication, fevers, weakness, or any other new or worsening symptoms that concern you.  Follow-up with your regular doctor.

## 2018-05-14 NOTE — ED Notes (Signed)
Patient transported to CT 

## 2018-05-14 NOTE — Telephone Encounter (Signed)
Pt reports nausea, vomiting, diarrhea, onset 4 pm yesterday. Sudden, severe bilateral lower back pain presented at same time. Pain is at lower back, sides and flank areas, constant 8/10 "Shooting at times."  States is "getting worse". Reports 1-2 episodes of vomiting since yesterday but "many" episodes of diarrhea. Stools are yellowish. States has not been able to drink, only few sips;  urine "dark yellow' Has not noted any hematuria. Unsure of fever but "felt warm yesterday, chills this AM."  Pt sounds distressed. Directed to ED. Grandmother will drive.  Reason for Disposition . Patient sounds very sick or weak to the triager  Answer Assessment - Initial Assessment Questions 1. ONSET: "When did the pain begin?"      Yesterday 4pm 2. LOCATION: "Where does it hurt?" (upper, mid or lower back)     Lower back, sides, flank areas 3. SEVERITY: "How bad is the pain?"  (e.g., Scale 1-10; mild, moderate, or severe)   - MILD (1-3): doesn't interfere with normal activities    - MODERATE (4-7): interferes with normal activities or awakens from sleep    - SEVERE (8-10): excruciating pain, unable to do any normal activities      Severe 4. PATTERN: "Is the pain constant?" (e.g., yes, no; constant, intermittent)      constant 5. RADIATION: "Does the pain shoot into your legs or elsewhere?"     no 6. CAUSE:  "What do you think is causing the back pain?"      unsure 7. BACK OVERUSE:  "Any recent lifting of heavy objects, strenuous work or exercise?"     no 8. MEDICATIONS: "What have you taken so far for the pain?" (e.g., nothing, acetaminophen, NSAIDS)      9. NEUROLOGIC SYMPTOMS: "Do you have any weakness, numbness, or problems with bowel/bladder control?"     no 10. OTHER SYMPTOMS: "Do you have any other symptoms?" (e.g., fever, abdominal pain, burning with urination, blood in urine)    Nausea, vomiting, diarrhea, onset with back pain.  Urine dark.  Protocols used: BACK PAIN-A-AH

## 2018-05-14 NOTE — ED Provider Notes (Signed)
[facility] Emergency Department Provider Note ____________________________________________   First MD Initiated Contact with Patient 05/14/18 1227     (approximate)  I have reviewed the triage vital signs and the nursing notes.   HISTORY  Chief Complaint Back Pain; Emesis; and Diarrhea    HPI Buford L Pryer is a 27 y.o. female with PMH as noted below who presents with bilateral low back pain, acute onset yesterday, and associated with vomiting and diarrhea.  Patient states that she initially started to have some nausea, vomiting and a few episodes of diarrhea the day prior, and then the pain started.  It is bilateral and nonradiating.  She states it feels similar to prior kidney infections.  She has not had any specific urinary symptoms, and she denies vaginal bleeding or discharge.  She denies any significant abdominal pain.  Past Medical History:  Diagnosis Date  . GAD (generalized anxiety disorder)   . History of miscarriage   . Migraines 05/12/2017  . Preterm labor    1st pregnancy had to get medicine at 21 weeks because of labor  . Tobacco abuse    quit: 06/06/2015    Patient Active Problem List   Diagnosis Date Noted  . OME (otitis media with effusion), right 03/25/2018  . GAD (generalized anxiety disorder) 05/12/2017  . Migraines 05/12/2017  . Tobacco abuse 06/11/2015    Past Surgical History:  Procedure Laterality Date  . CESAREAN SECTION  2008  . CESAREAN SECTION WITH BILATERAL TUBAL LIGATION Bilateral 02/04/2016   Procedure: REPEAT CESAREAN SECTION WITH BILATERAL TUBAL LIGATION;  Surgeon: Hildred Laser, MD;  Location: [facility] ORS;  Service: Obstetrics;  Laterality: Bilateral;  . IUD REMOVAL      Prior to Admission medications   Medication Sig Start Date End Date Taking? Authorizing Provider  antipyrine-benzocaine Lyla Son) OTIC solution Place 3-4 drops into the right ear every 2 (two) hours as needed for ear pain. 03/25/18   Tower, Audrie Gallus, MD  azithromycin (ZITHROMAX Z-PAK) 250 MG tablet Take 2 pills by mouth today and then 1 pill daily for 4 days 03/25/18   Tower, Audrie Gallus, MD  dicyclomine (BENTYL) 10 MG capsule Take 1 capsule (10 mg total) by mouth 3 (three) times daily as needed for up to 5 days for spasms. 05/14/18 05/19/18  Dionne Bucy, MD  fluticasone (FLONASE) 50 MCG/ACT nasal spray Place 2 sprays into both nostrils daily. 03/25/18   Tower, Audrie Gallus, MD  hydrOXYzine (ATARAX/VISTARIL) 25 MG tablet Take 1 tablet (25 mg total) by mouth 2 (two) times daily as needed for anxiety. 01/29/18   Doreene Nest, NP  NEOMYCIN-POLYMYXIN-HYDROCORTISONE (CORTISPORIN) 1 % SOLN OTIC solution Place 3 drops into the right ear every 8 (eight) hours. 03/25/18   Tower, Audrie Gallus, MD  ondansetron (ZOFRAN) 8 MG tablet Take 1 tablet (8 mg total) by mouth every 8 (eight) hours as needed for nausea or vomiting. 05/14/18   Dionne Bucy, MD  venlafaxine XR (EFFEXOR XR) 37.5 MG 24 hr capsule Take 1 capsule (37.5 mg total) by mouth daily with breakfast. 03/10/18   Doreene Nest, NP    Allergies Penicillin g and Penicillins  Family History  Problem Relation Age of Onset  . Cancer Paternal Grandfather        unsure of what type    Social History Social History   Tobacco Use  . Smoking status: Current Some Day Smoker    Types: Cigarettes  . Smokeless tobacco: Never Used  . Tobacco  comment: occ cigarette  Substance Use Topics  . Alcohol use: No    Alcohol/week: 0.0 oz  . Drug use: No    Review of Systems  Constitutional: No fever. Eyes: No redness. ENT: No neck pain. Cardiovascular: Denies chest pain. Respiratory: Denies shortness of breath. Gastrointestinal: Positive for nausea, vomiting, diarrhea. Genitourinary: Negative for dysuria.  Musculoskeletal: Positive for back pain. Skin: Negative for rash. Neurological: Negative for headache.   ____________________________________________   PHYSICAL EXAM:  VITAL  SIGNS: ED Triage Vitals  Enc Vitals Group     BP 05/14/18 1024 114/73     Pulse Rate 05/14/18 1024 82     Resp 05/14/18 1024 18     Temp 05/14/18 1024 99 F (37.2 C)     Temp Source 05/14/18 1024 Oral     SpO2 05/14/18 1024 99 %     Weight 05/14/18 0929 155 lb (70.3 kg)     Height 05/14/18 0929 5\' 4"  (1.626 m)     Head Circumference --      Peak Flow --      Pain Score 05/14/18 0929 8     Pain Loc --      Pain Edu? --      Excl. in GC? --     Constitutional: Alert and oriented. Well appearing and in no acute distress. Eyes: Conjunctivae are normal.  Head: Atraumatic. Nose: No congestion/rhinnorhea. Mouth/Throat: Mucous membranes are moist.   Neck: Normal range of motion.  Cardiovascular: Good peripheral circulation. Respiratory: Normal respiratory effort.   Gastrointestinal: Soft with mild bilateral lower abdominal and suprapubic discomfort. No distention.  Genitourinary: No CVA tenderness.  Mild bilateral flank tenderness. Musculoskeletal: Extremities warm and well perfused.  Neurologic:  Normal speech and language. No gross focal neurologic deficits are appreciated.  Skin:  Skin is warm and dry. No rash noted. Psychiatric: Mood and affect are normal. Speech and behavior are normal.   ____________________________________________   LABS (all labs ordered are listed, but only abnormal results are displayed)  Labs Reviewed  COMPREHENSIVE METABOLIC PANEL - Abnormal; Notable for the following components:      Result Value   Glucose, Bld 101 (*)    All other components within normal limits  CBC - Abnormal; Notable for the following components:   RBC 5.36 (*)    RDW 15.4 (*)    All other components within normal limits  URINALYSIS, COMPLETE (UACMP) WITH MICROSCOPIC - Abnormal; Notable for the following components:   Color, Urine YELLOW (*)    APPearance CLOUDY (*)    All other components within normal limits  LIPASE, BLOOD  POC URINE PREG, ED  POCT PREGNANCY, URINE    ____________________________________________  EKG   ____________________________________________  RADIOLOGY    ____________________________________________   PROCEDURES  Procedure(s) performed: No  Procedures  Critical Care performed: No ____________________________________________   INITIAL IMPRESSION / ASSESSMENT AND PLAN / ED COURSE  Pertinent labs & imaging results that were available during my care of the patient were reviewed by me and considered in my medical decision making (see chart for details).  27 year old female with PMH as noted above presents with bilateral back and flank pain with nausea, vomiting, diarrhea over the last 1 to 2 days.  She states it is similar to prior kidney infections although she has no specific urinary symptoms.  On exam, the patient is actually quite well-appearing, vital signs are normal except for borderline temperature, and the remainder of the exam is as described above.  Differential includes  pyelonephritis, cystitis, gastroenteritis, musculoskeletal pain, myalgias due to viral syndrome, or less likely other intra-abdominal cause.  We will obtain labs, UA, and treat symptomatically.  If UA shows no findings consistent with pyelonephritis, then I will consider imaging.  ----------------------------------------- 3:17 PM on 05/14/2018 -----------------------------------------  The UA showed no significant findings, so I went ahead with a CT.  This was also negative.  Given the predominance of nausea, vomiting, diarrhea, I suspect that the patient is having viral gastroenteritis with some myalgias, versus abdominal cramping that is radiating to her back.  She feels much better after the fluids and medication here.  She would like to go home.  I counseled her on the results of the work-up.  Return precautions given, and she expresses understanding.   ____________________________________________   FINAL CLINICAL IMPRESSION(S) /  ED DIAGNOSES  Final diagnoses:  Nausea vomiting and diarrhea  Acute bilateral back pain, unspecified back location      NEW MEDICATIONS STARTED DURING THIS VISIT:  New Prescriptions   DICYCLOMINE (BENTYL) 10 MG CAPSULE    Take 1 capsule (10 mg total) by mouth 3 (three) times daily as needed for up to 5 days for spasms.   ONDANSETRON (ZOFRAN) 8 MG TABLET    Take 1 tablet (8 mg total) by mouth every 8 (eight) hours as needed for nausea or vomiting.     Note:  This document was prepared using Dragon voice recognition software and may include unintentional dictation errors.    Dionne BucySiadecki, Monick Rena, MD 05/14/18 [phone] [id number]

## 2018-05-14 NOTE — Telephone Encounter (Signed)
Noted, patient currently at [facility] ED, chart reviewed.

## 2018-05-14 NOTE — ED Notes (Signed)
Pt alert and oriented X4, active, cooperative, pt in NAD. RR even and unlabored, color WNL.  Pt informed to return if any life threatening symptoms occur.  Discharge and followup instructions reviewed.  

## 2018-05-14 NOTE — Telephone Encounter (Signed)
Per chart review tab pt is at [facility] ED. 

## 2018-05-14 NOTE — ED Triage Notes (Signed)
Pt reports pain to her back for the past couple of days with some vomiting and diarrhea. Pt denies urinary sx's. Reports pain is constant. Denies heavy lifting or injuries.

## 2018-05-14 NOTE — ED Notes (Signed)
Urine not collected due to pt not able to go

## 2018-05-18 ENCOUNTER — Other Ambulatory Visit: Payer: Self-pay | Admitting: Primary Care

## 2018-05-18 DIAGNOSIS — F411 Generalized anxiety disorder: Secondary | ICD-10-CM

## 2018-05-18 NOTE — Telephone Encounter (Signed)
requesting refill Hydroxyzine to [facility]; Last refilled # 30 on 01/29/18. Pt seen and discussed hydroxyzine 12/30/17.Marland Kitchen. Please advise.

## 2018-05-20 MED ORDER — HYDROXYZINE HCL 25 MG PO TABS: 25.0000 mg | ORAL_TABLET | Freq: Two times a day (BID) | ORAL | 0 refills | Status: DC | PRN

## 2018-05-20 NOTE — Telephone Encounter (Signed)
Noted, refill sent to pharmacy. 

## 2018-05-25 ENCOUNTER — Encounter: Payer: Self-pay | Admitting: Primary Care

## 2018-05-25 DIAGNOSIS — F411 Generalized anxiety disorder: Secondary | ICD-10-CM

## 2018-05-26 MED ORDER — VENLAFAXINE HCL ER 37.5 MG PO CP24: 37.5000 mg | ORAL_CAPSULE | Freq: Every day | ORAL | 1 refills | Status: DC

## 2018-05-26 NOTE — Telephone Encounter (Signed)
Ok to refill? refill request for   venlafaxine XR (EFFEXOR XR) 37.5 MG 24 hr capsule Last prescribed on 03/10/2018  Last seen on 02/19/2018  hydroxyzine was last prescribed on 05/20/2018

## 2018-08-25 ENCOUNTER — Encounter: Payer: Self-pay | Admitting: Primary Care

## 2018-08-25 ENCOUNTER — Other Ambulatory Visit (HOSPITAL_COMMUNITY)
Admission: RE | Admit: 2018-08-25 | Discharge: 2018-08-25 | Disposition: A | Discharge status: Home / Self Care | Payer: Commercial Managed Care - PPO | Source: Ambulatory Visit | Attending: Primary Care | Admitting: Primary Care

## 2018-08-25 ENCOUNTER — Ambulatory Visit: Payer: Commercial Managed Care - PPO | Admitting: Primary Care

## 2018-08-25 VITALS — BP 110/64 | HR 65 | Temp 98.8°F | Wt 175.1 lb

## 2018-08-25 DIAGNOSIS — F331 Major depressive disorder, recurrent, moderate: Secondary | ICD-10-CM

## 2018-08-25 DIAGNOSIS — Z124 Encounter for screening for malignant neoplasm of cervix: Secondary | ICD-10-CM | POA: Diagnosis not present

## 2018-08-25 DIAGNOSIS — F411 Generalized anxiety disorder: Secondary | ICD-10-CM

## 2018-08-25 DIAGNOSIS — F329 Major depressive disorder, single episode, unspecified: Secondary | ICD-10-CM

## 2018-08-25 HISTORY — DX: Major depressive disorder, single episode, unspecified: F32.9

## 2018-08-25 MED ORDER — HYDROXYZINE HCL 25 MG PO TABS: 25.0000 mg | ORAL_TABLET | Freq: Two times a day (BID) | ORAL | 0 refills | Status: DC | PRN

## 2018-08-25 MED ORDER — BUPROPION HCL ER (XL) 150 MG PO TB24: 150.0000 mg | ORAL_TABLET | Freq: Every day | ORAL | 1 refills | Status: DC

## 2018-08-25 NOTE — Assessment & Plan Note (Signed)
Nearly daily symptoms. Failed treatment for anxiety with Effexor, Fluoxetine in the past. Declines therapy.  Long discussion regarding treatment moving forward. Will try Wellbutrin XL 150 mg daily to start treatment, referral placed to psychiatry for further evaluation. She agrees. Rx for hydroxyzine 50 mg PRN panic attacks.  Denies SI/HI.

## 2018-08-25 NOTE — Patient Instructions (Signed)
Start bupropion ER 150 mg tablets for anxiety and depression. Take 1 tablet by mouth once daily in the morning.  You may take 1-2 tablets of hydroxyzine as needed for panic attacks.  You will be contacted regarding your referral to psychiatry.  Please let us know if you have not been contacted within one week.   Please update me in 4-6 weeks regarding the medication, especially if you haven't seen the psychiatrist.   It was a pleasure to see you today!

## 2018-08-25 NOTE — Progress Notes (Signed)
Subjective:    Patient ID: Vicki Cruz, female    DOB: 1991-09-16, 27 y.o.   MRN: [id number]  HPI  Vicki Cruz is a 27 year old female who presents today to discuss anxiety and depression symptoms and is also due for pap smear.  1) Anxiety and Depression: Previously managed on Fluoxetine, Buspar, and Effexor at various times. Her last treatment was with Effexor XR 37.5 mg which she stopped taking shortly after prescribed in February 2019 as she found it not helpful for symptoms. She did not contact our office.  Since her last visit she feels as though her anxiety has become "severe". Symptoms include crying, irritability, mood swings, worrying, feeling down/depressed, panic attacks. Symptoms have been present for over one year, worse over the last one month. She's been eating to help cope with symptoms, has gained a lot of weight.   She's not felt that any of the prior treatment has helped with symptoms. She doesn't wish to pursue therapy as she doesn't want to "talk to anybody". GAD 7 score of 19 and PHQ 9 score of 16 today.    Review of Systems  Respiratory: Negative for shortness of breath.   Cardiovascular: Negative for chest pain.  Psychiatric/Behavioral: Positive for sleep disturbance. Negative for suicidal ideas. The patient is nervous/anxious.        See HPI       Past Medical History:  Diagnosis Date  . GAD (generalized anxiety disorder)   . History of miscarriage   . Migraines 05/12/2017  . Preterm labor    1st pregnancy had to get medicine at 21 weeks because of labor  . Tobacco abuse    quit: 06/06/2015     Social History   Socioeconomic History  . Marital status: Married    Spouse name: Not on file  . Number of children: 1  . Years of education: Not on file  . Highest education level: Not on file  Occupational History  . Occupation: Chief Strategy Officer  . Financial resource strain: Not on file  . Food insecurity:    Worry: Not on file    Inability: Not  on file  . Transportation needs:    Medical: Not on file    Non-medical: Not on file  Tobacco Use  . Smoking status: Current Some Day Smoker    Types: Cigarettes  . Smokeless tobacco: Never Used  . Tobacco comment: occ cigarette  Substance and Sexual Activity  . Alcohol use: No    Alcohol/week: 0.0 standard drinks  . Drug use: No  . Sexual activity: Yes    Partners: Male    Birth control/protection: Surgical  Lifestyle  . Physical activity:    Days per week: Not on file    Minutes per session: Not on file  . Stress: Not on file  Relationships  . Social connections:    Talks on phone: Not on file    Gets together: Not on file    Attends religious service: Not on file    Active member of club or organization: Not on file    Attends meetings of clubs or organizations: Not on file    Relationship status: Not on file  . Intimate partner violence:    Fear of current or ex partner: Not on file    Emotionally abused: Not on file    Physically abused: Not on file    Forced sexual activity: Not on file  Other Topics Concern  . Not  on file  Social History Narrative  . Not on file    Past Surgical History:  Procedure Laterality Date  . CESAREAN SECTION  2008  . CESAREAN SECTION WITH BILATERAL TUBAL LIGATION Bilateral 02/04/2016   Procedure: REPEAT CESAREAN SECTION WITH BILATERAL TUBAL LIGATION;  Surgeon: Hildred Laser, MD;  Location: [facility] ORS;  Service: Obstetrics;  Laterality: Bilateral;  . IUD REMOVAL      Family History  Problem Relation Age of Onset  . Cancer Paternal Grandfather        unsure of what type    Allergies  Allergen Reactions  . Penicillin G Rash  . Penicillins Nausea And Vomiting, Rash and Other (See Comments)    Reaction:  Fever  Has patient had a PCN reaction causing immediate rash, facial/tongue/throat swelling, SOB or lightheadedness with hypotension: No Has patient had a PCN reaction causing severe rash involving mucus membranes or skin necrosis:  No Has patient had a PCN reaction that required hospitalization No Has patient had a PCN reaction occurring within the last 10 years: No If all of the above answers are "NO", then may proceed with Cephalosporin use.    No current outpatient medications on file prior to visit.   No current facility-administered medications on file prior to visit.     BP 110/64 (BP Location: Right Arm, Patient Position: Sitting, Cuff Size: Normal)   Pulse 65   Temp 98.8 F (37.1 C)   Wt 175 lb 0.8 oz (79.4 kg)   LMP 08/23/2018   SpO2 99%   BMI 30.05 kg/m    Objective:   Physical Exam  Constitutional: She appears well-nourished.  Neck: Neck supple.  Cardiovascular: Normal rate and regular rhythm.  Respiratory: Effort normal and breath sounds normal.  Genitourinary: There is no tenderness or lesion on the right labia. There is no tenderness or lesion on the left labia. Cervix exhibits no motion tenderness and no discharge. Right adnexum displays no tenderness. Left adnexum displays no tenderness. There is bleeding in the vagina. No vaginal discharge found.  Genitourinary Comments: On menstrual cycle  Skin: Skin is warm and dry.  Psychiatric: She has a normal mood and affect.           Assessment & Plan:

## 2018-08-25 NOTE — Assessment & Plan Note (Signed)
PHQ 9 score of 13 today. Nearly daily symptoms. Failed treatment for anxiety with Effexor, Fluoxetine in the past. Declines therapy.  Long discussion regarding treatment moving forward. Will try Wellbutrin XL 150 mg daily to start treatment, referral placed to psychiatry for further evaluation. She agrees.   Denies SI/HI.

## 2018-08-26 LAB — CYTOLOGY - PAP
Diagnosis: NEGATIVE
HPV: NOT DETECTED

## 2018-08-31 ENCOUNTER — Encounter: Payer: Managed Care, Other (non HMO) | Admitting: Obstetrics and Gynecology

## 2018-09-10 ENCOUNTER — Telehealth: Payer: Self-pay | Admitting: Obstetrics and Gynecology

## 2018-09-10 ENCOUNTER — Encounter: Payer: Self-pay | Admitting: Obstetrics and Gynecology

## 2018-09-10 ENCOUNTER — Ambulatory Visit: Payer: Commercial Managed Care - PPO | Admitting: Obstetrics and Gynecology

## 2018-09-10 VITALS — BP 113/79 | HR 87 | Ht 64.0 in | Wt 172.0 lb

## 2018-09-10 DIAGNOSIS — Z9851 Tubal ligation status: Secondary | ICD-10-CM | POA: Diagnosis not present

## 2018-09-10 DIAGNOSIS — N946 Dysmenorrhea, unspecified: Secondary | ICD-10-CM

## 2018-09-10 DIAGNOSIS — H9201 Otalgia, right ear: Secondary | ICD-10-CM

## 2018-09-10 DIAGNOSIS — N926 Irregular menstruation, unspecified: Secondary | ICD-10-CM

## 2018-09-10 MED ORDER — CEFDINIR 300 MG PO CAPS: 300.0000 mg | ORAL_CAPSULE | Freq: Two times a day (BID) | ORAL | 0 refills | Status: DC

## 2018-09-10 MED ORDER — ANTIPYRINE-BENZOCAINE 5.4-1.4 % OT SOLN: 3.0000 [drp] | OTIC | 0 refills | Status: DC | PRN

## 2018-09-10 MED ORDER — NEOMYCIN-POLYMYXIN-HC 1 % OT SOLN: 3.0000 [drp] | Freq: Four times a day (QID) | OTIC | 0 refills | Status: DC

## 2018-09-10 MED ORDER — NORETHINDRONE 0.35 MG PO TABS: 1.0000 | ORAL_TABLET | Freq: Every day | ORAL | 11 refills | Status: DC

## 2018-09-10 NOTE — Progress Notes (Signed)
Pt stated that she is having a lot of pain and cramping during her cycle. Pt stated that she as has sharp pain that goes down her legs. Pt also stated that her cycle skips some months. Pt also stated that she has ear pain.

## 2018-09-10 NOTE — Telephone Encounter (Signed)
The paient would like a call back is possible in regards to her medication not being available at her pharmacy Please advise.

## 2018-09-10 NOTE — Progress Notes (Signed)
GYNECOLOGY PROGRESS NOTE  Subjective:    Patient ID: Vicki Cruz, female    DOB: July 03, 1991, 27 y.o.   MRN: [id number]  HPI  Patient is a 27 y.o. G18P2012 female who presents for complaints of irregular menses (skips 2-3 months at a time), and severe pelvic cramping around the time of her menses that causes pain that shoots down her legs.  She notes that her cycles are sometimes very heavy, but this is not consistent. Cycles when they do come last ~ 3-7 days. Takes OTC meds that offer minimal relief. She reports that these symptoms had been ongoing since prior to the birth of her last child ~ 2 years ago, however over time have become more intense and more frequent. She denies painful intercourse, but does note some occasional pelvic pain outside of her menses. She currently has a BTL for contraception, and has use contraception in the past (including combined OCPs and IUD (which perforated)), and would like to do something that could potentially end her periods (athough not looking for definitive management with hysterectomy).  Patient's last menstrual period was 08/23/2018.  Additionally, patient complains of sharp right ear pain, notes she is hardly able to touch it or lay on that side. This has been ongoing several days. She denies coughing, sneezing, sore throat or congestion. She denies recent sickness or sick contact except for her grandmother, who also had ear pain several days prior to her.    The following portions of the patient's history were reviewed and updated as appropriate: allergies, current medications, past family history, past medical history, past social history, past surgical history and problem list.  Review of Systems Pertinent items noted in HPI and remainder of comprehensive ROS otherwise negative.   Objective:   Blood pressure 113/79, pulse 87, height 5\' 4"  (1.626 m), weight 172 lb (78 kg), last menstrual period 08/23/2018. General appearance: alert and no  distress  Ears: normal TM and external ear canal left ear and abnormal TM right ear - erythematous and bulging Abdomen: soft, non-tender; bowel sounds normal; no masses,  no organomegaly Pelvic: deferred Extremities: extremities normal, atraumatic, no cyanosis or edema Neurologic: Grossly normal   Assessment:   Irregular menses Severe dysmenorrhea Right ear pain  Plan:   - Discussion had regarding management of severe dysmenorrhea and irregular menses. Discussed options for possibly "terminating menses" as patient desires, including another IUD, continuous combined OCPs, Nexplanon, NuvaRing (continuous use), Depo Provera, or other oral progesterone use.  She does not desire surgical management. Patient declines IUD due to h/o perforation, and is leery of combined hormonal regimens at this time as she really does not desires to be on birth control. Also declines Depo Provera or Nexplanon due to the possibility of more irregular bleeding and requiring injections. Discussed options of progesterone therapy (Aygestin vs progesterone OCPs). Advised that either would require a cycle of on 21 days/off 7 days to prevent breakthrough bleeding, and that Aygestin could not be used long-term (i.e. over years unlike the OCPs).  Patient will now consider use of progesterone OCPs, understands that periods may not go away, but symptoms will likely decrease to be more manageable. She notes she is ok with this. Also advised to set alarm for pills as she is concerned about forgetting to take them.  - Right ear pain - possible ear infection.  Will prescribe Amoxicillin and otic ear drops for discomfort.  - Will f/u in 2-3 months to reassess symptoms. If no improvement, can  perform further workup with ultrasound and labs, and last resort laparoscopy for assessment of possible endometriosis.  - Return to clinic for any scheduled appointments or for any gynecologic concerns as needed. Routine health maintenance  emphasized.  Patient is overdue annual exam. Will schedule after follow up of current problem visit.    A total of 15 minutes were spent face-to-face with the patient during this encounter and over half of that time dealt with counseling and coordination of care.   Hildred Laser, MD [facility]

## 2018-09-10 NOTE — Telephone Encounter (Signed)
Pt called and she stated that Auralgan for her ears is no longer available.

## 2018-09-13 NOTE — Telephone Encounter (Signed)
Pt was informed by mychart that [facility] would be aware of the medication and prescribe her something else.

## 2018-09-15 ENCOUNTER — Ambulatory Visit: Payer: Managed Care, Other (non HMO) | Admitting: Psychiatry

## 2018-09-16 ENCOUNTER — Other Ambulatory Visit: Payer: Self-pay | Admitting: Primary Care

## 2018-09-16 DIAGNOSIS — F411 Generalized anxiety disorder: Secondary | ICD-10-CM

## 2018-09-16 DIAGNOSIS — F331 Major depressive disorder, recurrent, moderate: Secondary | ICD-10-CM

## 2018-10-15 ENCOUNTER — Ambulatory Visit: Payer: Commercial Managed Care - PPO | Admitting: Primary Care

## 2018-10-15 DIAGNOSIS — Z0289 Encounter for other administrative examinations: Secondary | ICD-10-CM

## 2018-11-30 ENCOUNTER — Encounter: Payer: Self-pay | Admitting: Obstetrics and Gynecology

## 2019-01-10 ENCOUNTER — Ambulatory Visit: Payer: Commercial Managed Care - PPO | Admitting: Primary Care

## 2019-01-10 ENCOUNTER — Encounter: Payer: Self-pay | Admitting: Primary Care

## 2019-01-10 VITALS — BP 122/82 | HR 115 | Temp 98.2°F | Ht 64.25 in | Wt 173.8 lb

## 2019-01-10 DIAGNOSIS — B9789 Other viral agents as the cause of diseases classified elsewhere: Secondary | ICD-10-CM

## 2019-01-10 DIAGNOSIS — J069 Acute upper respiratory infection, unspecified: Secondary | ICD-10-CM

## 2019-01-10 DIAGNOSIS — R52 Pain, unspecified: Secondary | ICD-10-CM

## 2019-01-10 LAB — POC INFLUENZA A&B (BINAX/QUICKVUE)
Influenza A, POC: NEGATIVE
Influenza B, POC: NEGATIVE

## 2019-01-10 MED ORDER — HYDROCOD POLST-CPM POLST ER 10-8 MG/5ML PO SUER: 5.0000 mL | Freq: Two times a day (BID) | ORAL | 0 refills | Status: DC | PRN

## 2019-01-10 NOTE — Addendum Note (Signed)
Addended by: Tawnya Crook on: 01/10/2019 04:32 PM   Modules accepted: Orders

## 2019-01-10 NOTE — Assessment & Plan Note (Signed)
Sudden onset of flu-like symptoms. Exam today with sinus tachycardia, otherwise unremarkable.  Rapid flu test negative today. Suspect viral illness at this point and will treat with conservative measures. Rx for Tussionex sent to pharmacy. Discussed to use nasal spray, alternate with Ibuprofen/Tylenol. Fluids, rest, follow up PRN.

## 2019-01-10 NOTE — Patient Instructions (Signed)
You may take the cough suppressant every 12 hours as needed for cough and rest. Caution this medication contains codeine which may cause drowsiness.   Use the nasal spray for nasal congestion/sinus pressure/ear pressure.  Alternate Ibuprofen and Tylenol as discussed. Do not exceed 3000 mg of Tylenol and 2400 mg of Ibuprofen in 24 hours.  Make sure to drink plenty of water and rest.  It was a pleasure to see you today!

## 2019-01-10 NOTE — Progress Notes (Signed)
Subjective:    Patient ID: Vicki Cruz, female    DOB: October 05, 1991, 28 y.o.   MRN: [id number]  HPI  Vicki Cruz is a 28 year old female with a history of tobacco abuse who presents today with a chief complaint of sore throat.  She also reports body aches, fatigue, cough, nasal congestion. Her symptoms began two days ago with sore throat, sudden onset.   She's taken Ibuprofen, OTC allergy pill, Dayquil Cold and Flu without much improvement. She denies sick contacts, fevers.   Review of Systems  Constitutional: Positive for chills and fatigue. Negative for fever.  HENT: Positive for congestion and sore throat. Negative for ear pain and sinus pressure.   Respiratory: Positive for cough.   Musculoskeletal: Positive for myalgias.       Past Medical History:  Diagnosis Date  . GAD (generalized anxiety disorder)   . History of miscarriage   . MDD (major depressive disorder) 08/25/2018  . Migraines 05/12/2017  . Preterm labor    1st pregnancy had to get medicine at 21 weeks because of labor  . Tobacco abuse    quit: 06/06/2015     Social History   Socioeconomic History  . Marital status: Married    Spouse name: Not on file  . Number of children: 1  . Years of education: Not on file  . Highest education level: Not on file  Occupational History  . Occupation: Chief Strategy Officer  . Financial resource strain: Not on file  . Food insecurity:    Worry: Not on file    Inability: Not on file  . Transportation needs:    Medical: Not on file    Non-medical: Not on file  Tobacco Use  . Smoking status: Current Some Day Smoker    Types: Cigarettes  . Smokeless tobacco: Never Used  . Tobacco comment: occ cigarette  Substance and Sexual Activity  . Alcohol use: Yes    Alcohol/week: 0.0 standard drinks    Comment: ocass  . Drug use: No  . Sexual activity: Yes    Partners: Male    Birth control/protection: Surgical    Comment: tibual  Lifestyle  . Physical activity:    Days  per week: Not on file    Minutes per session: Not on file  . Stress: Not on file  Relationships  . Social connections:    Talks on phone: Not on file    Gets together: Not on file    Attends religious service: Not on file    Active member of club or organization: Not on file    Attends meetings of clubs or organizations: Not on file    Relationship status: Not on file  . Intimate partner violence:    Fear of current or ex partner: Not on file    Emotionally abused: Not on file    Physically abused: Not on file    Forced sexual activity: Not on file  Other Topics Concern  . Not on file  Social History Narrative  . Not on file    Past Surgical History:  Procedure Laterality Date  . CESAREAN SECTION  2008  . CESAREAN SECTION WITH BILATERAL TUBAL LIGATION Bilateral 02/04/2016   Procedure: REPEAT CESAREAN SECTION WITH BILATERAL TUBAL LIGATION;  Surgeon: Hildred Laser, MD;  Location: [facility] ORS;  Service: Obstetrics;  Laterality: Bilateral;  . IUD REMOVAL      Family History  Problem Relation Age of Onset  . Cancer Paternal Grandfather  unsure of what type  . Graves' disease Maternal Grandmother     Allergies  Allergen Reactions  . Penicillin G Rash  . Penicillins Nausea And Vomiting, Rash and Other (See Comments)    Reaction:  Fever  Has patient had a PCN reaction causing immediate rash, facial/tongue/throat swelling, SOB or lightheadedness with hypotension: No Has patient had a PCN reaction causing severe rash involving mucus membranes or skin necrosis: No Has patient had a PCN reaction that required hospitalization No Has patient had a PCN reaction occurring within the last 10 years: No If all of the above answers are "NO", then may proceed with Cephalosporin use.    Current Outpatient Medications on File Prior to Visit  Medication Sig Dispense Refill  . buPROPion (WELLBUTRIN XL) 150 MG 24 hr tablet TAKE 1 TABLET BY MOUTH EVERY DAY 90 tablet 0  . cefdinir (OMNICEF)  300 MG capsule Take 1 capsule (300 mg total) by mouth 2 (two) times daily. 14 capsule 0  . hydrOXYzine (ATARAX/VISTARIL) 25 MG tablet Take 1-2 tablets (25-50 mg total) by mouth 2 (two) times daily as needed for anxiety. 60 tablet 0  . NEOMYCIN-POLYMYXIN-HYDROCORTISONE (CORTISPORIN) 1 % SOLN OTIC solution Place 3 drops into the right ear 4 (four) times daily. 10 mL 0  . norethindrone (MICRONOR,CAMILA,ERRIN) 0.35 MG tablet Take 1 tablet (0.35 mg total) by mouth daily. 1 Package 11   No current facility-administered medications on file prior to visit.     BP 122/82   Pulse (!) 115   Temp 98.2 F (36.8 C) (Oral)   Ht 5' 4.25" (1.632 m)   Wt 173 lb 12 oz (78.8 kg)   LMP 11/14/2018   SpO2 97%   BMI 29.59 kg/m    Objective:   Physical Exam  Constitutional: She appears well-nourished. She does not appear ill.  HENT:  Right Ear: Tympanic membrane and ear canal normal.  Left Ear: Tympanic membrane and ear canal normal.  Nose: No mucosal edema. Right sinus exhibits no maxillary sinus tenderness and no frontal sinus tenderness. Left sinus exhibits no maxillary sinus tenderness and no frontal sinus tenderness.  Mouth/Throat: Oropharynx is clear and moist.  Neck: Neck supple.  Cardiovascular: Regular rhythm.  Respiratory: Effort normal and breath sounds normal. She has no wheezes.  Skin: Skin is warm and dry.           Assessment & Plan:

## 2019-01-18 DIAGNOSIS — R112 Nausea with vomiting, unspecified: Secondary | ICD-10-CM | POA: Diagnosis not present

## 2019-01-18 DIAGNOSIS — F17200 Nicotine dependence, unspecified, uncomplicated: Secondary | ICD-10-CM | POA: Diagnosis not present

## 2019-01-18 DIAGNOSIS — R509 Fever, unspecified: Secondary | ICD-10-CM | POA: Diagnosis not present

## 2019-01-26 ENCOUNTER — Ambulatory Visit: Payer: Commercial Managed Care - PPO | Admitting: Family Medicine

## 2019-01-26 ENCOUNTER — Encounter: Payer: Self-pay | Admitting: Family Medicine

## 2019-01-26 VITALS — BP 118/66 | HR 125 | Temp 98.3°F | Ht 64.25 in | Wt 173.2 lb

## 2019-01-26 DIAGNOSIS — B9789 Other viral agents as the cause of diseases classified elsewhere: Secondary | ICD-10-CM

## 2019-01-26 DIAGNOSIS — R509 Fever, unspecified: Secondary | ICD-10-CM | POA: Diagnosis not present

## 2019-01-26 DIAGNOSIS — J069 Acute upper respiratory infection, unspecified: Secondary | ICD-10-CM | POA: Diagnosis not present

## 2019-01-26 DIAGNOSIS — H6690 Otitis media, unspecified, unspecified ear: Secondary | ICD-10-CM | POA: Insufficient documentation

## 2019-01-26 DIAGNOSIS — H6502 Acute serous otitis media, left ear: Secondary | ICD-10-CM

## 2019-01-26 MED ORDER — HYDROCOD POLST-CPM POLST ER 10-8 MG/5ML PO SUER: 5.0000 mL | Freq: Two times a day (BID) | ORAL | 0 refills | Status: DC | PRN

## 2019-01-26 MED ORDER — DOXYCYCLINE HYCLATE 100 MG PO TABS: 100.0000 mg | ORAL_TABLET | Freq: Two times a day (BID) | ORAL | 0 refills | Status: DC

## 2019-01-26 NOTE — Patient Instructions (Signed)
Drink lots of fluids and rest when you can   Take doxycycline for ear infection  flonase nasal spray may help nasal and ear congestion over the counter Use cough medicine with caution of sedation and habit   Tylenol or ibuprofen for fever or pain as needed   Update if not starting to improve in a week or if worsening

## 2019-01-26 NOTE — Progress Notes (Signed)
Subjective:    Patient ID: Vicki Cruz, female    DOB: 1991-12-04, 28 y.o.   MRN: [id number]  HPI 28 yo pt of NP Clark here with uri symptoms, L ear pain /ST and body aches   She is an occ smoker  Pulse is high today Pulse Readings from Last 3 Encounters:  01/26/19 (!) 125  01/10/19 (!) 115  09/10/18 87   Pulse ox 98% Temp 98.3  Symptoms started 2/2   (the day before her dad died)  Started with body aches and fever (102)  (son had flu type A the week before)   She had to travel due to death of her dad  Chills /hot and cold Bad headache  Went on for 3-4 days with a bad cough   Now cough persists -so bad it makes her throw up  Productive -phlegm green  Chest is sore Throat is sore   Today L ear is sore and has a ringing in it   No facial pain   Fever is better   otc day quil cold and flu  Also cough drops   Patient Active Problem List   Diagnosis Date Noted  . Otitis media 01/26/2019  . Viral URI with cough 01/10/2019  . MDD (major depressive disorder) 08/25/2018  . OME (otitis media with effusion), right 03/25/2018  . GAD (generalized anxiety disorder) 05/12/2017  . Migraines 05/12/2017  . Tobacco abuse 06/11/2015   Past Medical History:  Diagnosis Date  . GAD (generalized anxiety disorder)   . History of miscarriage   . MDD (major depressive disorder) 08/25/2018  . Migraines 05/12/2017  . Preterm labor    1st pregnancy had to get medicine at 21 weeks because of labor  . Tobacco abuse    quit: 06/06/2015   Past Surgical History:  Procedure Laterality Date  . CESAREAN SECTION  2008  . CESAREAN SECTION WITH BILATERAL TUBAL LIGATION Bilateral 02/04/2016   Procedure: REPEAT CESAREAN SECTION WITH BILATERAL TUBAL LIGATION;  Surgeon: Hildred Laser, MD;  Location: [facility] ORS;  Service: Obstetrics;  Laterality: Bilateral;  . IUD REMOVAL     Social History   Tobacco Use  . Smoking status: Current Some Day Smoker    Types: Cigarettes  . Smokeless tobacco:  Never Used  . Tobacco comment: occ cigarette  Substance Use Topics  . Alcohol use: Yes    Alcohol/week: 0.0 standard drinks    Comment: ocass  . Drug use: No   Family History  Problem Relation Age of Onset  . Cancer Paternal Grandfather        unsure of what type  . Graves' disease Maternal Grandmother    Allergies  Allergen Reactions  . Penicillin G Rash  . Penicillins Nausea And Vomiting, Rash and Other (See Comments)    Reaction:  Fever  Has patient had a PCN reaction causing immediate rash, facial/tongue/throat swelling, SOB or lightheadedness with hypotension: No Has patient had a PCN reaction causing severe rash involving mucus membranes or skin necrosis: No Has patient had a PCN reaction that required hospitalization No Has patient had a PCN reaction occurring within the last 10 years: No If all of the above answers are "NO", then may proceed with Cephalosporin use.   Current Outpatient Medications on File Prior to Visit  Medication Sig Dispense Refill  . buPROPion (WELLBUTRIN XL) 150 MG 24 hr tablet TAKE 1 TABLET BY MOUTH EVERY DAY 90 tablet 0  . hydrOXYzine (ATARAX/VISTARIL) 25 MG tablet Take 1-2  tablets (25-50 mg total) by mouth 2 (two) times daily as needed for anxiety. 60 tablet 0  . NEOMYCIN-POLYMYXIN-HYDROCORTISONE (CORTISPORIN) 1 % SOLN OTIC solution Place 3 drops into the right ear 4 (four) times daily. 10 mL 0  . norethindrone (MICRONOR,CAMILA,ERRIN) 0.35 MG tablet Take 1 tablet (0.35 mg total) by mouth daily. 1 Package 11   No current facility-administered medications on file prior to visit.     Review of Systems  Constitutional: Positive for appetite change and fatigue. Negative for fever.       Fever is resolved  HENT: Positive for congestion, ear pain, postnasal drip, rhinorrhea, sinus pressure, sneezing, sore throat and voice change. Negative for ear discharge, facial swelling and hearing loss.   Eyes: Negative for pain and discharge.  Respiratory:  Positive for cough. Negative for chest tightness, shortness of breath, wheezing and stridor.   Cardiovascular: Negative for chest pain.  Gastrointestinal: Negative for diarrhea, nausea and vomiting.  Genitourinary: Negative for frequency, hematuria and urgency.  Musculoskeletal: Negative for arthralgias and myalgias.  Skin: Negative for rash.  Neurological: Positive for headaches. Negative for dizziness, weakness and light-headedness.  Psychiatric/Behavioral: Negative for confusion and dysphoric mood.       Objective:   Physical Exam Constitutional:      General: She is not in acute distress.    Appearance: Normal appearance. She is well-developed. She is not ill-appearing, toxic-appearing or diaphoretic.  HENT:     Head: Normocephalic and atraumatic.     Comments: Nares are injected and congested      Right Ear: Tympanic membrane, ear canal and external ear normal. There is no impacted cerumen.     Left Ear: Ear canal and external ear normal. A middle ear effusion is present. There is no impacted cerumen. Tympanic membrane is erythematous.     Nose: Congestion and rhinorrhea present.     Mouth/Throat:     Mouth: Mucous membranes are moist.     Pharynx: Oropharynx is clear. No oropharyngeal exudate or posterior oropharyngeal erythema.     Comments: Clear pnd  Eyes:     General:        Right eye: No discharge.        Left eye: No discharge.     Conjunctiva/sclera: Conjunctivae normal.     Pupils: Pupils are equal, round, and reactive to light.  Neck:     Musculoskeletal: Normal range of motion and neck supple. No neck rigidity.  Cardiovascular:     Rate and Rhythm: Normal rate.     Heart sounds: Normal heart sounds.  Pulmonary:     Effort: Pulmonary effort is normal. No respiratory distress.     Breath sounds: Normal breath sounds. No stridor. No wheezing, rhonchi or rales.     Comments: Good air exch Wet sounding cough No rales/rhonchi or wheeze No prolonged exp phase    Chest:     Chest wall: No tenderness.  Lymphadenopathy:     Cervical: No cervical adenopathy.  Skin:    General: Skin is warm and dry.     Capillary Refill: Capillary refill takes less than 2 seconds.     Findings: No rash.  Neurological:     Mental Status: She is alert.     Cranial Nerves: No cranial nerve deficit.  Psychiatric:        Mood and Affect: Mood normal.           Assessment & Plan:   Problem List Items Addressed This Visit  Respiratory   Viral URI with cough    Suspect this bout started with influenza Too late for antiviral  Also L OM -tx with doxycycline sympt care tussionex with warning of sedation and habit  Rest/fluids         Nervous and Auditory   Otitis media - Primary    S/p influenza (no longer with fever)  Still coughing and with nasal congestion  Will cover with doxycycline Enc use of flonase to help ETD and congestion       Relevant Medications   doxycycline (VIBRA-TABS) 100 MG tablet    Other Visit Diagnoses    Fever, unspecified fever cause

## 2019-01-26 NOTE — Assessment & Plan Note (Signed)
Suspect this bout started with influenza Too late for antiviral  Also L OM -tx with doxycycline sympt care tussionex with warning of sedation and habit  Rest/fluids

## 2019-01-26 NOTE — Assessment & Plan Note (Signed)
S/p influenza (no longer with fever)  Still coughing and with nasal congestion  Will cover with doxycycline Enc use of flonase to help ETD and congestion

## 2019-05-05 NOTE — Telephone Encounter (Signed)
Pt having burning upon urination with frequency and voiding smaller amts. No fever,chills,cough,SOB,S/T,muscle pain,diarrhea,H/A and no loss of taste or smell. No travel and no known exposure to covid or flu. pts son was recently in hospital and has HSP. Pt cannot take him out of the house. pts husband works in [place] with a 30' lunch time and so pt cannot come to office to get urine container. Pt wants to know if med could be sent to [facility] Pt request cb.

## 2019-05-05 NOTE — Telephone Encounter (Signed)
At minimum she needs a virtual visit.  Please set up at her convenience.

## 2019-05-06 ENCOUNTER — Encounter: Payer: Self-pay | Admitting: Primary Care

## 2019-05-06 ENCOUNTER — Telehealth (INDEPENDENT_AMBULATORY_CARE_PROVIDER_SITE_OTHER): Payer: Commercial Managed Care - PPO | Admitting: Primary Care

## 2019-05-06 DIAGNOSIS — R350 Frequency of micturition: Secondary | ICD-10-CM | POA: Diagnosis not present

## 2019-05-06 DIAGNOSIS — N3090 Cystitis, unspecified without hematuria: Secondary | ICD-10-CM | POA: Insufficient documentation

## 2019-05-06 MED ORDER — SULFAMETHOXAZOLE-TRIMETHOPRIM 800-160 MG PO TABS: 1.0000 | ORAL_TABLET | Freq: Two times a day (BID) | ORAL | 0 refills | Status: DC

## 2019-05-06 NOTE — Patient Instructions (Signed)
Start Bactrim DS (sulfamethoxazole/trimethoprim) tablets for urinary tract infection. Take 1 tablet by mouth twice daily for 3 days.  Ensure you are consuming 64 ounces of water daily.  Please update me early next week.  It was a pleasure to see you today! Mayra Reel, NP-C

## 2019-05-06 NOTE — Assessment & Plan Note (Signed)
History of UTI in the past, symptoms are similar per patient. She cannot leave home to provide [place] with a specimen given her very young immunocompromised son. She has no vaginal symptoms. Agree to treat for presumed UTI, encouraged more water intake. Rx for Bactrim DS tablets sent to pharmacy. She will update. If no improvement then we will need to see her in the office, she verbalized understanding.

## 2019-05-06 NOTE — Progress Notes (Addendum)
Subjective:    Patient ID: Vicki Cruz, female    DOB: July 17, 1991, 28 y.o.   MRN: [id number]  HPI  Virtual Visit via Video Note  I connected with Vicki Cruz on 05/06/19 at  8:00 AM EDT by a video enabled telemedicine application and verified that I am speaking with the correct person using two identifiers.  Location: Patient: Home Provider: Office   I discussed the limitations of evaluation and management by telemedicine and the availability of in person appointments. The patient expressed understanding and agreed to proceed.  We attempted to connect via video but her video would not connect with both My Chart and [employer] servers. We had to conduct the visit via phone. Phone call lasted 5 min and 32 sec.  History of Present Illness:  Vicki Cruz is a 28 year old female with a history of UTI who presents today with a chief complaint of urinary frequency.   She also reports dysuria, urinary urgency with incomplete bladder emptying. She denies hematuria, pelvic pressure, fevers, vaginal itching/discharge. These symptoms are similar to her prior UTI's. She cannot leave [place] a urine specimen as she has to be at home with her young, immunocompromised son.    Observations/Objective:  Alert and oriented. No distress. Speaking in complete sentences.   Assessment and Plan:  History of UTI in the past, symptoms are similar per patient. She cannot leave home to provide [place] with a specimen given her very young immunocompromised son. She has no vaginal symptoms. Agree to treat for presumed UTI, encouraged more water intake. Rx for Bactrim DS tablets sent to pharmacy. She will update. If no improvement then we will need to see her in the office, she verbalized understanding.   Follow Up Instructions:  Start Bactrim DS (sulfamethoxazole/trimethoprim) tablets for urinary tract infection. Take 1 tablet by mouth twice daily for 3 days.  Ensure you are consuming 64 ounces of water  daily.  Please update me early next week.  It was a pleasure to see you today! Mayra Reel, NP-C    I discussed the assessment and treatment plan with the patient. The patient was provided an opportunity to ask questions and all were answered. The patient agreed with the plan and demonstrated an understanding of the instructions.   The patient was advised to call back or seek an in-person evaluation if the symptoms worsen or if the condition fails to improve as anticipated.     Doreene Nest, NP    Review of Systems  Constitutional: Negative for fever.  Gastrointestinal: Negative for abdominal pain and nausea.  Genitourinary: Positive for dysuria, frequency and urgency. Negative for hematuria, menstrual problem and vaginal discharge.       Past Medical History:  Diagnosis Date  . GAD (generalized anxiety disorder)   . History of miscarriage   . MDD (major depressive disorder) 08/25/2018  . Migraines 05/12/2017  . Preterm labor    1st pregnancy had to get medicine at 21 weeks because of labor  . Tobacco abuse    quit: 06/06/2015     Social History   Socioeconomic History  . Marital status: Married    Spouse name: Not on file  . Number of children: 1  . Years of education: Not on file  . Highest education level: Not on file  Occupational History  . Occupation: Chief Strategy Officer  . Financial resource strain: Not on file  . Food insecurity:    Worry: Not on file  Inability: Not on file  . Transportation needs:    Medical: Not on file    Non-medical: Not on file  Tobacco Use  . Smoking status: Current Some Day Smoker    Types: Cigarettes  . Smokeless tobacco: Never Used  . Tobacco comment: occ cigarette  Substance and Sexual Activity  . Alcohol use: Yes    Alcohol/week: 0.0 standard drinks    Comment: ocass  . Drug use: No  . Sexual activity: Yes    Partners: Male    Birth control/protection: Surgical    Comment: tibual  Lifestyle  . Physical  activity:    Days per week: Not on file    Minutes per session: Not on file  . Stress: Not on file  Relationships  . Social connections:    Talks on phone: Not on file    Gets together: Not on file    Attends religious service: Not on file    Active member of club or organization: Not on file    Attends meetings of clubs or organizations: Not on file    Relationship status: Not on file  . Intimate partner violence:    Fear of current or ex partner: Not on file    Emotionally abused: Not on file    Physically abused: Not on file    Forced sexual activity: Not on file  Other Topics Concern  . Not on file  Social History Narrative  . Not on file    Past Surgical History:  Procedure Laterality Date  . CESAREAN SECTION  2008  . CESAREAN SECTION WITH BILATERAL TUBAL LIGATION Bilateral 02/04/2016   Procedure: REPEAT CESAREAN SECTION WITH BILATERAL TUBAL LIGATION;  Surgeon: Hildred Laser, MD;  Location: [facility] ORS;  Service: Obstetrics;  Laterality: Bilateral;  . IUD REMOVAL      Family History  Problem Relation Age of Onset  . Cancer Paternal Grandfather        unsure of what type  . Graves' disease Maternal Grandmother     Allergies  Allergen Reactions  . Penicillin G Rash  . Penicillins Nausea And Vomiting, Rash and Other (See Comments)    Reaction:  Fever  Has patient had a PCN reaction causing immediate rash, facial/tongue/throat swelling, SOB or lightheadedness with hypotension: No Has patient had a PCN reaction causing severe rash involving mucus membranes or skin necrosis: No Has patient had a PCN reaction that required hospitalization No Has patient had a PCN reaction occurring within the last 10 years: No If all of the above answers are "NO", then may proceed with Cephalosporin use.    Current Outpatient Medications on File Prior to Visit  Medication Sig Dispense Refill  . buPROPion (WELLBUTRIN XL) 150 MG 24 hr tablet TAKE 1 TABLET BY MOUTH EVERY DAY 90 tablet 0  .  chlorpheniramine-HYDROcodone (TUSSIONEX PENNKINETIC ER) 10-8 MG/5ML SUER Take 5 mLs by mouth every 12 (twelve) hours as needed for cough. When not working or driving due to sedation 60 mL 0  . doxycycline (VIBRA-TABS) 100 MG tablet Take 1 tablet (100 mg total) by mouth 2 (two) times daily. 14 tablet 0  . hydrOXYzine (ATARAX/VISTARIL) 25 MG tablet Take 1-2 tablets (25-50 mg total) by mouth 2 (two) times daily as needed for anxiety. 60 tablet 0  . NEOMYCIN-POLYMYXIN-HYDROCORTISONE (CORTISPORIN) 1 % SOLN OTIC solution Place 3 drops into the right ear 4 (four) times daily. 10 mL 0  . norethindrone (MICRONOR,CAMILA,ERRIN) 0.35 MG tablet Take 1 tablet (0.35 mg total) by mouth  daily. 1 Package 11   No current facility-administered medications on file prior to visit.     Wt 173 lb (78.5 kg)   BMI 29.46 kg/m    Objective:   Physical Exam  Constitutional: She is oriented to person, place, and time.  Respiratory: Effort normal.  Neurological: She is alert and oriented to person, place, and time.  Psychiatric: She has a normal mood and affect.           Assessment & Plan:

## 2019-08-31 ENCOUNTER — Ambulatory Visit (INDEPENDENT_AMBULATORY_CARE_PROVIDER_SITE_OTHER): Payer: Commercial Managed Care - PPO | Admitting: Primary Care

## 2019-08-31 ENCOUNTER — Encounter: Payer: Self-pay | Admitting: Primary Care

## 2019-08-31 VITALS — Wt 173.0 lb

## 2019-08-31 DIAGNOSIS — R350 Frequency of micturition: Secondary | ICD-10-CM

## 2019-08-31 MED ORDER — SULFAMETHOXAZOLE-TRIMETHOPRIM 800-160 MG PO TABS: 1.0000 | ORAL_TABLET | Freq: Two times a day (BID) | ORAL | 0 refills | Status: DC

## 2019-08-31 NOTE — Progress Notes (Signed)
Subjective:    Patient ID: Vicki Cruz, female    DOB: 11/28/1991, 28 y.o.   MRN: [id number]  HPI  Virtual Visit via Video Note  I connected with Vicki Cruz on 08/31/19 at  3:40 PM EDT by a video enabled telemedicine application and verified that I am speaking with the correct person using two identifiers.  Location: Patient: Home Provider: Office   I discussed the limitations of evaluation and management by telemedicine and the availability of in person appointments. The patient expressed understanding and agreed to proceed.  History of Present Illness:  Vicki Cruz is a 28 year old female with a history of recurrent UTI who presents today with a chief complaint of urinary frequency.  She also reports difficulty urinating at times, flank pain bilaterally, dysuria. Her symptoms began three days ago with slight dysuria. She denies hematuria, vaginal discharge, fevers.   Her last UTI was in May 2020, no urine sample collected as she could not come into the office. Treated with Bactrim DS with resolve of symptoms. Her last UTI prior to May 2020 was in 2019. She has never been evaluated by Urology.    Observations/Objective:  Alert and oriented. Appears well, not sickly. No distress. Speaking in complete sentences.   Assessment and Plan:  History of UTI, symptoms are convincing. We had a long discussion regarding the need for urine testing, especially cultures. She verablized understanding and will come into the clinic for any future symptoms. Rx for Bactrim Course provided today. Encouraged proper water hydration.  Follow up PRN.  Follow Up Instructions:  Start Bactrim DS (sulfamethoxazole/trimethoprim) tablets for urinary tract infection. Take 1 tablet by mouth twice daily for 5 days.  Ensure you are consuming 64 ounces of water daily.  It was a pleasure to see you today! Mayra ReelKate Ramsey Guadamuz, NP-C    I discussed the assessment and treatment plan with the patient.  The patient was provided an opportunity to ask questions and all were answered. The patient agreed with the plan and demonstrated an understanding of the instructions.   The patient was advised to call back or seek an in-person evaluation if the symptoms worsen or if the condition fails to improve as anticipated.    Doreene NestKatherine K Amarian Botero, NP    Review of Systems  Constitutional: Negative for fever.  Gastrointestinal: Negative for abdominal pain.  Genitourinary: Positive for difficulty urinating, dysuria, flank pain and frequency. Negative for hematuria, vaginal bleeding and vaginal discharge.       Past Medical History:  Diagnosis Date  . GAD (generalized anxiety disorder)   . History of miscarriage   . MDD (major depressive disorder) 08/25/2018  . Migraines 05/12/2017  . Preterm labor    1st pregnancy had to get medicine at 21 weeks because of labor  . Tobacco abuse    quit: 06/06/2015     Social History   Socioeconomic History  . Marital status: Married    Spouse name: Not on file  . Number of children: 1  . Years of education: Not on file  . Highest education level: Not on file  Occupational History  . Occupation: [employer]  Social Needs  . Financial resource strain: Not on file  . Food insecurity    Worry: Not on file    Inability: Not on file  . Transportation needs    Medical: Not on file    Non-medical: Not on file  Tobacco Use  . Smoking status: Current Some Day Smoker  Types: Cigarettes  . Smokeless tobacco: Never Used  . Tobacco comment: occ cigarette  Substance and Sexual Activity  . Alcohol use: Yes    Alcohol/week: 0.0 standard drinks    Comment: ocass  . Drug use: No  . Sexual activity: Yes    Partners: Male    Birth control/protection: Surgical    Comment: tibual  Lifestyle  . Physical activity    Days per week: Not on file    Minutes per session: Not on file  . Stress: Not on file  Relationships  . Social Herbalist on phone: Not on  file    Gets together: Not on file    Attends religious service: Not on file    Active member of club or organization: Not on file    Attends meetings of clubs or organizations: Not on file    Relationship status: Not on file  . Intimate partner violence    Fear of current or ex partner: Not on file    Emotionally abused: Not on file    Physically abused: Not on file    Forced sexual activity: Not on file  Other Topics Concern  . Not on file  Social History Narrative  . Not on file    Past Surgical History:  Procedure Laterality Date  . CESAREAN SECTION  2008  . CESAREAN SECTION WITH BILATERAL TUBAL LIGATION Bilateral 02/04/2016   Procedure: REPEAT CESAREAN SECTION WITH BILATERAL TUBAL LIGATION;  Surgeon: Rubie Maid, MD;  Location: [facility] ORS;  Service: Obstetrics;  Laterality: Bilateral;  . IUD REMOVAL      Family History  Problem Relation Age of Onset  . Cancer Paternal Grandfather        unsure of what type  . Graves' disease Maternal Grandmother     Allergies  Allergen Reactions  . Penicillin G Rash  . Penicillins Nausea And Vomiting, Rash and Other (See Comments)    Reaction:  Fever  Has patient had a PCN reaction causing immediate rash, facial/tongue/throat swelling, SOB or lightheadedness with hypotension: No Has patient had a PCN reaction causing severe rash involving mucus membranes or skin necrosis: No Has patient had a PCN reaction that required hospitalization No Has patient had a PCN reaction occurring within the last 10 years: No If all of the above answers are "NO", then may proceed with Cephalosporin use.    Current Outpatient Medications on File Prior to Visit  Medication Sig Dispense Refill  . buPROPion (WELLBUTRIN XL) 150 MG 24 hr tablet TAKE 1 TABLET BY MOUTH EVERY DAY 90 tablet 0  . hydrOXYzine (ATARAX/VISTARIL) 25 MG tablet Take 1-2 tablets (25-50 mg total) by mouth 2 (two) times daily as needed for anxiety. 60 tablet 0  . norethindrone  (MICRONOR,CAMILA,ERRIN) 0.35 MG tablet Take 1 tablet (0.35 mg total) by mouth daily. 1 Package 11   No current facility-administered medications on file prior to visit.     Wt 173 lb (78.5 kg)   LMP 08/28/2019   BMI 29.46 kg/m    Objective:   Physical Exam  Constitutional: She is oriented to person, place, and time. She appears well-nourished.  Respiratory: Effort normal.  Neurological: She is alert and oriented to person, place, and time.  Psychiatric: She has a normal mood and affect.           Assessment & Plan:

## 2019-08-31 NOTE — Assessment & Plan Note (Signed)
History of UTI, symptoms are convincing. We had a long discussion regarding the need for urine testing, especially cultures. She verablized understanding and will come into the clinic for any future symptoms. Rx for Bactrim Course provided today. Encouraged proper water hydration.  Follow up PRN.

## 2019-08-31 NOTE — Patient Instructions (Signed)
Start Bactrim DS (sulfamethoxazole/trimethoprim) tablets for urinary tract infection. Take 1 tablet by mouth twice daily for 5 days.  Ensure you are consuming 64 ounces of water daily.  It was a pleasure to see you today! Allie Bossier, NP-C

## 2019-08-31 NOTE — Telephone Encounter (Signed)
Vicki Cruz, can you add her as a virtual visit for UTI for 3:40?

## 2019-10-12 ENCOUNTER — Encounter: Payer: Self-pay | Admitting: Family Medicine

## 2019-10-12 ENCOUNTER — Other Ambulatory Visit: Payer: Self-pay

## 2019-10-12 ENCOUNTER — Ambulatory Visit (INDEPENDENT_AMBULATORY_CARE_PROVIDER_SITE_OTHER): Payer: Commercial Managed Care - PPO | Admitting: Family Medicine

## 2019-10-12 VITALS — BP 115/67 | HR 91 | Temp 98.1°F | Ht 64.25 in | Wt 171.5 lb

## 2019-10-12 DIAGNOSIS — R197 Diarrhea, unspecified: Secondary | ICD-10-CM

## 2019-10-12 DIAGNOSIS — G4700 Insomnia, unspecified: Secondary | ICD-10-CM | POA: Diagnosis not present

## 2019-10-12 DIAGNOSIS — G44209 Tension-type headache, unspecified, not intractable: Secondary | ICD-10-CM | POA: Insufficient documentation

## 2019-10-12 DIAGNOSIS — R3915 Urgency of urination: Secondary | ICD-10-CM | POA: Diagnosis not present

## 2019-10-12 LAB — POCT URINALYSIS DIPSTICK
Bilirubin, UA: NEGATIVE
Blood, UA: NEGATIVE
Glucose, UA: NEGATIVE
Ketones, UA: POSITIVE
Leukocytes, UA: NEGATIVE
Nitrite, UA: NEGATIVE
Protein, UA: POSITIVE — AB
Spec Grav, UA: 1.02 (ref 1.010–1.025)
Urobilinogen, UA: 0.2 E.U./dL
pH, UA: 6 (ref 5.0–8.0)

## 2019-10-12 NOTE — Assessment & Plan Note (Signed)
Suspect viral and given new onset advised COVID testing. Recommended f/u in 1 week if covid negative and symptoms not resolved

## 2019-10-12 NOTE — Assessment & Plan Note (Signed)
Advised increasing water, treatment of insomnia, and decreasing caffiene as well as heat.

## 2019-10-12 NOTE — Assessment & Plan Note (Signed)
Limited time to discuss. Sleep hygiene handout and advised trial of melatonin

## 2019-10-12 NOTE — Progress Notes (Signed)
Subjective:     Vicki Cruz is a 28 y.o. female presenting for Urinary Urgency (started about 2 nights ago but today this feels better. Has had some diarrhea, headache x 1 month.  fatigue.)     HPI   #UTI - started 2 days ago - but seems to have improved - urgency  -    # headache  - daily x 1 month - typical pattern a few times per month - whole head and more in the back and travels all the way around - feels like pressure/throbbing - endorses seeing spots - last eye doctor visit 6 months no changes to vision - Treatment: ibuprofen daily, tried migraine medication w/o improvement in the past, tylenol no helpful - sometimes starts in the morning/afternoon - caffeine: diet mountain dew 4-5 cans per day - Water: 2 cups of water a day - sleep: 2-3 hours   #Diarrhea - 4-5 bowel movements daily - started a few days ago - no blood in stool    Review of Systems  Constitutional: Positive for fever (99.4). Negative for chills.  HENT: Negative for congestion, sinus pain and sore throat.   Eyes: Positive for visual disturbance.  Respiratory: Negative for cough.   Gastrointestinal: Positive for diarrhea. Negative for abdominal pain, blood in stool, nausea and vomiting.  Genitourinary: Positive for urgency. Negative for dysuria, flank pain and frequency.  Musculoskeletal: Positive for myalgias. Negative for arthralgias.  Neurological: Positive for headaches.     Social History   Tobacco Use  Smoking Status Current Some Day Smoker   Types: Cigarettes  Smokeless Tobacco Never Used  Tobacco Comment   occ cigarette        Objective:    BP Readings from Last 3 Encounters:  10/12/19 115/67  01/26/19 118/66  01/10/19 122/82   Wt Readings from Last 3 Encounters:  10/12/19 171 lb 8 oz (77.8 kg)  08/31/19 173 lb (78.5 kg)  05/06/19 173 lb (78.5 kg)    BP 115/67    Pulse 91    Temp 98.1 F (36.7 C)    Ht 5' 4.25" (1.632 m)    Wt 171 lb 8 oz (77.8 kg)     LMP 09/04/2019    SpO2 98%    BMI 29.21 kg/m    Physical Exam Constitutional:      General: She is not in acute distress.    Appearance: She is well-developed. She is not diaphoretic.  HENT:     Head: Normocephalic and atraumatic.     Right Ear: External ear normal.     Left Ear: External ear normal.     Nose: Nose normal.  Eyes:     Extraocular Movements: Extraocular movements intact.     Conjunctiva/sclera: Conjunctivae normal.     Pupils: Pupils are equal, round, and reactive to light.  Neck:     Musculoskeletal: Neck supple.  Cardiovascular:     Rate and Rhythm: Normal rate and regular rhythm.     Heart sounds: No murmur.  Pulmonary:     Effort: Pulmonary effort is normal.     Breath sounds: Normal breath sounds.  Abdominal:     General: Abdomen is flat. Bowel sounds are normal. There is no distension.     Tenderness: There is no abdominal tenderness. There is no guarding.  Skin:    General: Skin is warm and dry.     Capillary Refill: Capillary refill takes less than 2 seconds.  Neurological:  Mental Status: She is alert. Mental status is at baseline.  Psychiatric:        Mood and Affect: Mood normal.        Behavior: Behavior normal.    UA: +protein, otherwise negative       Assessment & Plan:   Problem List Items Addressed This Visit      Other   Tension headache    Advised increasing water, treatment of insomnia, and decreasing caffiene as well as heat.       Insomnia    Limited time to discuss. Sleep hygiene handout and advised trial of melatonin      Diarrhea    Suspect viral and given new onset advised COVID testing. Recommended f/u in 1 week if covid negative and symptoms not resolved       Other Visit Diagnoses    Urinary urgency    -  Primary   Relevant Orders   POCT urinalysis dipstick (Completed)       Return if symptoms worsen or fail to improve.  Lynnda Child, MD

## 2019-10-12 NOTE — Patient Instructions (Addendum)
#Diarrhea - Increase hydration - if not improved in 1 week reach out to [facility] in [place] - there should be tents  #Headache - see below - increase water - try to have 64 oz per day - try heat or ice - do regular neck and shoulder stretching   Sleep hygiene checklist: 1. Avoid naps during the day 2. Avoid stimulants such as caffeine and nicotine. Avoid bedtime alcohol (it can speed onset of sleep but the body's metabolism can cause awakenings). At least 2 hours before bedtime 3. All forms of exercise help ensure sound sleep - limit vigorous exercise to morning or late afternoon 4. Avoid food too close to bedtime including chocolate (which contains caffeine) 5. Soak up natural light 6. Establish regular bedtime routine. 7. Associate bed with sleep - avoid TV, computer or phone, reading while in bed. 8. Ensure pleasant, relaxing sleep environment - quiet, dark, cool room.  Good Sleep Hygiene Habits -- Got to bed and wake up within an hour of the same time every day -- Avoid bright screens (from laptop, phone, TV) within at least 30 minutes before bed. The "blue light" supresses the sleep hormone melatonin and the content may stimulate as well -- Maintain a quiet and dark sleep environment (blackout curtains, turn on a fan or white noise to block out disruptive sounds) -- Practicing relaxing activites before bed (taking a shower, reading a book, journaling, meditation app) -- To quiet a busy mind -- consider journaling before bed (jotting down reminders, worry thoughts, as well as positive things like a gratitude list)   Begin a Mindfulness/Meditation practice -- this can take a little as 3 minutes -- You can find resources in books -- Or you can download apps like  ---- Headspace App (which currently has free content called "Weathering the Storm") ---- Calm (which has a few free options)  ---- Insignt Timer ---- Stop, Breathe & Think   # With each of these Apps - you should decline the "start free trial" offer and as you search through the App should be able to access some of their free content. You can also chose to pay for the content if you find one that works well for you.   # Many of them also offer sleep specific content which may help with insomnia   For sleep can try 3-5 mg of Melatonin at bedtime  General Headache Without Cause A headache is pain or discomfort that is felt around the head or neck area. There are many causes and types of headaches. In some cases, the cause may not be found. Follow these instructions at home: Watch your condition for any changes. Let your doctor know about them. Take these steps to help with your condition: Managing pain      Take over-the-counter and prescription medicines only as told by your doctor.  Lie down in a dark, quiet room when you have a headache.  If told, put ice on your head and neck area: ? Put ice in a plastic bag. ? Place a towel between your skin and the bag. ? Leave the ice on for 20 minutes, 2-3 times per day.  If told, put heat on the affected area. Use the heat source that your doctor recommends, such as a moist heat pack or a heating pad. ? Place a towel between your skin and the heat source. ? Leave the heat on for 20-30 minutes. ? Remove the heat  if your skin turns bright red. This is very important if you are unable to feel pain, heat, or cold. You may have a greater risk of getting burned.  Keep lights dim if bright lights bother you or make your headaches worse. Eating and drinking  Eat meals on a regular schedule.  If you drink alcohol: ? Limit how much you use to:  0-1 drink a day for women.  0-2 drinks a day for men. ? Be aware of how much alcohol is in your drink. In the [place] one drink equals one 12 oz bottle of beer (355 mL), one 5 oz glass of wine (148 mL), or one 1 oz glass of hard liquor (44 mL).  Stop drinking caffeine, or  reduce how much caffeine you drink. General instructions   Keep a journal to find out if certain things bring on headaches. For example, write down: ? What you eat and drink. ? How much sleep you get. ? Any change to your diet or medicines.  Get a massage or try other ways to relax.  Limit stress.  Sit up straight. Do not tighten (tense) your muscles.  Do not use any products that contain nicotine or tobacco. This includes cigarettes, e-cigarettes, and chewing tobacco. If you need help quitting, ask your doctor.  Exercise regularly as told by your doctor.  Get enough sleep. This often means 7-9 hours of sleep each night.  Keep all follow-up visits as told by your doctor. This is important. Contact a doctor if:  Your symptoms are not helped by medicine.  You have a headache that feels different than the other headaches.  You feel sick to your stomach (nauseous) or you throw up (vomit).  You have a fever. Get help right away if:  Your headache gets very bad quickly.  Your headache gets worse after a lot of physical activity.  You keep throwing up.  You have a stiff neck.  You have trouble seeing.  You have trouble speaking.  You have pain in the eye or ear.  Your muscles are weak or you lose muscle control.  You lose your balance or have trouble walking.  You feel like you will pass out (faint) or you pass out.  You are mixed up (confused).  You have a seizure. Summary  A headache is pain or discomfort that is felt around the head or neck area.  There are many causes and types of headaches. In some cases, the cause may not be found.  Keep a journal to help find out what causes your headaches. Watch your condition for any changes. Let your doctor know about them.  Contact a doctor if you have a headache that is different from usual, or if your headache is not helped by medicine.  Get help right away if your headache gets very bad, you throw up, you have  trouble seeing, you lose your balance, or you have a seizure. This information is not intended to replace advice given to you by your health care provider. Make sure you discuss any questions you have with your health care provider. Document Released: 09/09/2008 Document Revised: 06/21/2018 Document Reviewed: 06/21/2018 Elsevier Patient Education  2020 [employer].

## 2019-10-21 ENCOUNTER — Ambulatory Visit (INDEPENDENT_AMBULATORY_CARE_PROVIDER_SITE_OTHER): Payer: Commercial Managed Care - PPO | Admitting: Primary Care

## 2019-10-21 ENCOUNTER — Other Ambulatory Visit: Payer: Self-pay

## 2019-10-21 ENCOUNTER — Encounter: Payer: Self-pay | Admitting: Primary Care

## 2019-10-21 VITALS — BP 118/86 | HR 90 | Temp 97.3°F | Ht 64.25 in | Wt 172.2 lb

## 2019-10-21 DIAGNOSIS — N926 Irregular menstruation, unspecified: Secondary | ICD-10-CM | POA: Diagnosis not present

## 2019-10-21 DIAGNOSIS — R102 Pelvic and perineal pain: Secondary | ICD-10-CM

## 2019-10-21 DIAGNOSIS — N3090 Cystitis, unspecified without hematuria: Secondary | ICD-10-CM | POA: Diagnosis not present

## 2019-10-21 DIAGNOSIS — R300 Dysuria: Secondary | ICD-10-CM | POA: Diagnosis not present

## 2019-10-21 LAB — POC URINALSYSI DIPSTICK (AUTOMATED)
Bilirubin, UA: NEGATIVE
Blood, UA: NEGATIVE
Glucose, UA: NEGATIVE
Ketones, UA: NEGATIVE
Leukocytes, UA: NEGATIVE
Nitrite, UA: NEGATIVE
Protein, UA: NEGATIVE
Spec Grav, UA: 1.025 (ref 1.010–1.025)
Urobilinogen, UA: 0.2 E.U./dL
pH, UA: 6 (ref 5.0–8.0)

## 2019-10-21 LAB — POCT URINE PREGNANCY: Preg Test, Ur: NEGATIVE

## 2019-10-21 NOTE — Progress Notes (Signed)
Subjective:    Patient ID: Vicki Cruz, female    DOB: Dec 03, 1991, 28 y.o.   MRN: [id number]  HPI  Vicki Cruz is a 28 year old female with a history of migraines, anxiety disorder, urinary tract infection who presents today with a chief compliant of dysuria.   She also reports urinary frequency, left sided pelvic pain. Her symptoms began five days ago. She has a history of UTI's for years, will occur for several months at a time with several months without infection.   She denies hematuria, vaginal discharge, vaginal itching. She had intercourse six evenings ago and again five evenings ago. She's never seen urology in the past.   She was last treated for UTI in mid September via video visit. She could not drop off a urine specimen as she is living in [place]. Also endorsed that symptoms were very similar to prior UTI's. She was treated that day with Bactrim antibiotics and encouraged to follow up if symptoms persisted.   Evaluated in person on 10/12/19 for urinary urgency symptoms. UA that day with positive ketones and protein, otherwise unremarkable. No culture obtained.   Review of Systems  Constitutional: Negative for fever.  Gastrointestinal: Negative for abdominal pain.  Genitourinary: Positive for dysuria, frequency and pelvic pain. Negative for menstrual problem and vaginal discharge.       Past Medical History:  Diagnosis Date  . GAD (generalized anxiety disorder)   . History of miscarriage   . MDD (major depressive disorder) 08/25/2018  . Migraines 05/12/2017  . Preterm labor    1st pregnancy had to get medicine at 21 weeks because of labor  . Tobacco abuse    quit: 06/06/2015     Social History   Socioeconomic History  . Marital status: Married    Spouse name: Not on file  . Number of children: 1  . Years of education: Not on file  . Highest education level: Not on file  Occupational History  . Occupation: Geographical information systems officer  . Financial resource strain:  Not on file  . Food insecurity    Worry: Not on file    Inability: Not on file  . Transportation needs    Medical: Not on file    Non-medical: Not on file  Tobacco Use  . Smoking status: Current Some Day Smoker    Types: Cigarettes  . Smokeless tobacco: Never Used  . Tobacco comment: occ cigarette  Substance and Sexual Activity  . Alcohol use: Yes    Alcohol/week: 0.0 standard drinks    Comment: ocass  . Drug use: No  . Sexual activity: Yes    Partners: Male    Birth control/protection: Surgical    Comment: tibual  Lifestyle  . Physical activity    Days per week: Not on file    Minutes per session: Not on file  . Stress: Not on file  Relationships  . Social Herbalist on phone: Not on file    Gets together: Not on file    Attends religious service: Not on file    Active member of club or organization: Not on file    Attends meetings of clubs or organizations: Not on file    Relationship status: Not on file  . Intimate partner violence    Fear of current or ex partner: Not on file    Emotionally abused: Not on file    Physically abused: Not on file    Forced sexual activity:  Not on file  Other Topics Concern  . Not on file  Social History Narrative  . Not on file    Past Surgical History:  Procedure Laterality Date  . CESAREAN SECTION  2008  . CESAREAN SECTION WITH BILATERAL TUBAL LIGATION Bilateral 02/04/2016   Procedure: REPEAT CESAREAN SECTION WITH BILATERAL TUBAL LIGATION;  Surgeon: Hildred Laser, MD;  Location: [facility] ORS;  Service: Obstetrics;  Laterality: Bilateral;  . IUD REMOVAL      Family History  Problem Relation Age of Onset  . Cancer Paternal Grandfather        unsure of what type  . Graves' disease Maternal Grandmother     Allergies  Allergen Reactions  . Penicillin G Rash  . Penicillins Nausea And Vomiting, Rash and Other (See Comments)    Reaction:  Fever  Has patient had a PCN reaction causing immediate rash, facial/tongue/throat  swelling, SOB or lightheadedness with hypotension: No Has patient had a PCN reaction causing severe rash involving mucus membranes or skin necrosis: No Has patient had a PCN reaction that required hospitalization No Has patient had a PCN reaction occurring within the last 10 years: No If all of the above answers are "NO", then may proceed with Cephalosporin use.    No current outpatient medications on file prior to visit.   No current facility-administered medications on file prior to visit.     BP 118/86   Pulse 90   Temp (!) 97.3 F (36.3 C) (Temporal)   Ht 5' 4.25" (1.632 m)   Wt 172 lb 4 oz (78.1 kg)   LMP  (LMP Unknown)   SpO2 98%   BMI 29.34 kg/m    Objective:   Physical Exam  Constitutional: She appears well-nourished.  Cardiovascular: Normal rate.  Respiratory: Effort normal.  Genitourinary: Cervix exhibits discharge. Cervix exhibits no motion tenderness.    Vaginal discharge present.     No vaginal erythema.  No erythema in the vagina.    Genitourinary Comments: Mild to moderate whitish vaginal discharge   Skin: Skin is warm and dry.           Assessment & Plan:

## 2019-10-21 NOTE — Patient Instructions (Addendum)
We will be in touch once we receive your lab results.  You will be contacted regarding your referral to Urology.  Please let us know if you have not been contacted within two weeks.   It was a pleasure to see you today!

## 2019-10-23 ENCOUNTER — Other Ambulatory Visit: Payer: Self-pay | Admitting: Primary Care

## 2019-10-23 DIAGNOSIS — B3731 Acute candidiasis of vulva and vagina: Secondary | ICD-10-CM

## 2019-10-23 DIAGNOSIS — B373 Candidiasis of vulva and vagina: Secondary | ICD-10-CM

## 2019-10-23 DIAGNOSIS — N3000 Acute cystitis without hematuria: Secondary | ICD-10-CM

## 2019-10-23 LAB — WET PREP BY MOLECULAR PROBE
Candida species: DETECTED — AB
Gardnerella vaginalis: NOT DETECTED
MICRO NUMBER:: 1073324
SPECIMEN QUALITY:: ADEQUATE
Trichomonas vaginosis: NOT DETECTED

## 2019-10-23 LAB — URINE CULTURE
MICRO NUMBER:: 1073325
SPECIMEN QUALITY:: ADEQUATE

## 2019-10-23 LAB — C. TRACHOMATIS/N. GONORRHOEAE RNA
C. trachomatis RNA, TMA: NOT DETECTED
N. gonorrhoeae RNA, TMA: NOT DETECTED

## 2019-10-23 MED ORDER — FLUCONAZOLE 150 MG PO TABS: 150.0000 mg | ORAL_TABLET | Freq: Once | ORAL | 0 refills | Status: AC

## 2019-10-23 MED ORDER — SULFAMETHOXAZOLE-TRIMETHOPRIM 800-160 MG PO TABS: 1.0000 | ORAL_TABLET | Freq: Two times a day (BID) | ORAL | 0 refills | Status: DC

## 2019-10-23 NOTE — Assessment & Plan Note (Signed)
UA with protein and ketones, otherwise unremarkable. Culture positive for E coli infection with resistance to PCN. Rx for bactrim course sent to pharmacy.  Vaginal swab positive for candida. Rx for fluconazole course sent to pharmacy.  Referral placed to Urology for evaluation of recurrent cystitis.

## 2019-10-24 ENCOUNTER — Telehealth: Payer: Self-pay | Admitting: Primary Care

## 2019-10-24 ENCOUNTER — Other Ambulatory Visit: Payer: Self-pay | Admitting: Primary Care

## 2019-10-24 DIAGNOSIS — N3000 Acute cystitis without hematuria: Secondary | ICD-10-CM

## 2019-10-24 DIAGNOSIS — B3731 Acute candidiasis of vulva and vagina: Secondary | ICD-10-CM

## 2019-10-24 DIAGNOSIS — B373 Candidiasis of vulva and vagina: Secondary | ICD-10-CM

## 2019-10-24 MED ORDER — SULFAMETHOXAZOLE-TRIMETHOPRIM 800-160 MG PO TABS
1.0000 | ORAL_TABLET | Freq: Two times a day (BID) | ORAL | 0 refills | Status: DC
Start: 2019-10-24 — End: 2019-10-28

## 2019-10-24 MED ORDER — FLUCONAZOLE 150 MG PO TABS
ORAL_TABLET | ORAL | 0 refills | Status: DC
Start: 2019-10-24 — End: 2019-10-28

## 2019-10-24 NOTE — Telephone Encounter (Signed)
Spoken to patient and prescriptions have been resent to [facility] in [place]

## 2019-10-24 NOTE — Telephone Encounter (Signed)
Patient stated that she was prescribed two new prescriptions.  Patient called today and said that both prescriptions have been sent to the wrong pharmacy   She is needing these sent to the [facility] in [place] . Patient is requesting a call back once these have been sent

## 2019-10-27 NOTE — Telephone Encounter (Signed)
Rosaria Ferries, is there anyway we can get her in with Urology anywhere anytime soon?

## 2019-10-28 ENCOUNTER — Emergency Department
Admission: EM | Admit: 2019-10-28 | Discharge: 2019-10-28 | Disposition: A | Discharge status: Home / Self Care | Payer: Commercial Managed Care - PPO | Attending: Emergency Medicine | Admitting: Emergency Medicine

## 2019-10-28 ENCOUNTER — Encounter: Payer: Self-pay | Admitting: Emergency Medicine

## 2019-10-28 ENCOUNTER — Emergency Department: Payer: Commercial Managed Care - PPO

## 2019-10-28 ENCOUNTER — Ambulatory Visit: Admission: RE | Admit: 2019-10-28 | Payer: Commercial Managed Care - PPO | Source: Ambulatory Visit

## 2019-10-28 ENCOUNTER — Other Ambulatory Visit: Payer: Self-pay

## 2019-10-28 ENCOUNTER — Other Ambulatory Visit (INDEPENDENT_AMBULATORY_CARE_PROVIDER_SITE_OTHER): Payer: Commercial Managed Care - PPO

## 2019-10-28 ENCOUNTER — Telehealth: Payer: Self-pay | Admitting: Primary Care

## 2019-10-28 ENCOUNTER — Other Ambulatory Visit: Payer: Self-pay | Admitting: Primary Care

## 2019-10-28 DIAGNOSIS — N3090 Cystitis, unspecified without hematuria: Secondary | ICD-10-CM

## 2019-10-28 DIAGNOSIS — M545 Low back pain: Secondary | ICD-10-CM | POA: Diagnosis present

## 2019-10-28 DIAGNOSIS — N730 Acute parametritis and pelvic cellulitis: Secondary | ICD-10-CM | POA: Insufficient documentation

## 2019-10-28 DIAGNOSIS — N12 Tubulo-interstitial nephritis, not specified as acute or chronic: Secondary | ICD-10-CM | POA: Diagnosis not present

## 2019-10-28 DIAGNOSIS — F17210 Nicotine dependence, cigarettes, uncomplicated: Secondary | ICD-10-CM | POA: Insufficient documentation

## 2019-10-28 DIAGNOSIS — R300 Dysuria: Secondary | ICD-10-CM

## 2019-10-28 LAB — POC URINALSYSI DIPSTICK (AUTOMATED)
Bilirubin, UA: NEGATIVE
Blood, UA: NEGATIVE
Glucose, UA: NEGATIVE
Nitrite, UA: POSITIVE
Protein, UA: NEGATIVE
Spec Grav, UA: 1.02 (ref 1.010–1.025)
Urobilinogen, UA: 0.2 E.U./dL
pH, UA: 5 (ref 5.0–8.0)

## 2019-10-28 LAB — URINALYSIS, COMPLETE (UACMP) WITH MICROSCOPIC: Specific Gravity, Urine: 1.012 (ref 1.005–1.030)

## 2019-10-28 LAB — CBC WITH DIFFERENTIAL/PLATELET
Basophils Absolute: 0.1 10*3/uL (ref 0.0–0.1)
Basophils Relative: 0.5 % (ref 0.0–3.0)
Eosinophils Absolute: 0.2 10*3/uL (ref 0.0–0.7)
Eosinophils Relative: 1.4 % (ref 0.0–5.0)
HCT: 42.9 % (ref 36.0–46.0)
Hemoglobin: 14.4 g/dL (ref 12.0–15.0)
Lymphocytes Relative: 20.5 % (ref 12.0–46.0)
Lymphs Abs: 2.3 10*3/uL (ref 0.7–4.0)
MCHC: 33.6 g/dL (ref 30.0–36.0)
MCV: 85.5 fl (ref 78.0–100.0)
Monocytes Absolute: 1 10*3/uL (ref 0.1–1.0)
Monocytes Relative: 9.3 % (ref 3.0–12.0)
Neutro Abs: 7.8 10*3/uL — ABNORMAL HIGH (ref 1.4–7.7)
Neutrophils Relative %: 68.3 % (ref 43.0–77.0)
Platelets: 182 10*3/uL (ref 150.0–400.0)
RBC: 5.02 Mil/uL (ref 3.87–5.11)
RDW: 14.1 % (ref 11.5–15.5)
WBC: 11.3 10*3/uL — ABNORMAL HIGH (ref 4.0–10.5)

## 2019-10-28 LAB — CBC
HCT: 42.6 % (ref 36.0–46.0)
Hemoglobin: 14.7 g/dL (ref 12.0–15.0)
MCH: 28.5 pg (ref 26.0–34.0)
MCHC: 34.5 g/dL (ref 30.0–36.0)
MCV: 82.6 fL (ref 80.0–100.0)
Platelets: 214 10*3/uL (ref 150–400)
RBC: 5.16 MIL/uL — ABNORMAL HIGH (ref 3.87–5.11)
RDW: 13.7 % (ref 11.5–15.5)
WBC: 15.9 10*3/uL — ABNORMAL HIGH (ref 4.0–10.5)
nRBC: 0 % (ref 0.0–0.2)

## 2019-10-28 LAB — WET PREP, GENITAL
Clue Cells Wet Prep HPF POC: NONE SEEN
Sperm: NONE SEEN
Trich, Wet Prep: NONE SEEN
Yeast Wet Prep HPF POC: NONE SEEN

## 2019-10-28 LAB — BASIC METABOLIC PANEL
BUN: 13 mg/dL (ref 6–23)
CO2: 27 mEq/L (ref 19–32)
Calcium: 9.3 mg/dL (ref 8.4–10.5)
Chloride: 104 mEq/L (ref 96–112)
Creatinine, Ser: 0.96 mg/dL (ref 0.40–1.20)
GFR: 68.16 mL/min (ref >60.00)
Glucose, Bld: 78 mg/dL (ref 70–99)
Potassium: 3.9 mEq/L (ref 3.5–5.1)
Sodium: 140 mEq/L (ref 135–145)

## 2019-10-28 LAB — POCT PREGNANCY, URINE: Preg Test, Ur: NEGATIVE

## 2019-10-28 LAB — LACTIC ACID, PLASMA: Lactic Acid, Venous: 1.1 mmol/L (ref 0.5–1.9)

## 2019-10-28 MED ORDER — ONDANSETRON 4 MG PO TBDP: 4.0000 mg | ORAL_TABLET | Freq: Three times a day (TID) | ORAL | 0 refills | Status: DC | PRN

## 2019-10-28 MED ORDER — SODIUM CHLORIDE 0.9 % IV SOLN
1.0000 g | Freq: Once | INTRAVENOUS | Status: AC
  Administered 2019-10-28: 1 g via INTRAVENOUS
  Filled 2019-10-28: qty 10

## 2019-10-28 MED ORDER — OXYCODONE-ACETAMINOPHEN 5-325 MG PO TABS: 1.0000 | ORAL_TABLET | Freq: Four times a day (QID) | ORAL | 0 refills | Status: AC | PRN

## 2019-10-28 MED ORDER — HYDROMORPHONE HCL 1 MG/ML IJ SOLN
1.0000 mg | Freq: Once | INTRAMUSCULAR | Status: AC
  Administered 2019-10-28: 1 mg via INTRAVENOUS
  Filled 2019-10-28: qty 1

## 2019-10-28 MED ORDER — DOXYCYCLINE MONOHYDRATE 100 MG PO TABS: 100.0000 mg | ORAL_TABLET | Freq: Two times a day (BID) | ORAL | 0 refills | Status: AC

## 2019-10-28 MED ORDER — ONDANSETRON HCL 4 MG/2ML IJ SOLN: 4.0000 mg | Freq: Once | INTRAMUSCULAR | Status: DC

## 2019-10-28 MED ORDER — ONDANSETRON HCL 4 MG/2ML IJ SOLN
4.0000 mg | Freq: Once | INTRAMUSCULAR | Status: AC
  Administered 2019-10-28: 4 mg via INTRAVENOUS
  Filled 2019-10-28: qty 2

## 2019-10-28 MED ORDER — CEPHALEXIN 500 MG PO CAPS: 500.0000 mg | ORAL_CAPSULE | Freq: Four times a day (QID) | ORAL | 0 refills | Status: AC

## 2019-10-28 MED ORDER — MORPHINE SULFATE (PF) 4 MG/ML IV SOLN
4.0000 mg | Freq: Once | INTRAVENOUS | Status: AC
  Administered 2019-10-28: 17:00:00 4 mg via INTRAVENOUS
  Filled 2019-10-28: qty 1

## 2019-10-28 MED ORDER — DOXYCYCLINE HYCLATE 100 MG PO TABS
100.0000 mg | ORAL_TABLET | Freq: Once | ORAL | Status: AC
  Administered 2019-10-28: 100 mg via ORAL
  Filled 2019-10-28: qty 1

## 2019-10-28 MED ORDER — SODIUM CHLORIDE 0.9 % IV BOLUS
1000.0000 mL | Freq: Once | INTRAVENOUS | Status: AC
  Administered 2019-10-28: 1000 mL via INTRAVENOUS

## 2019-10-28 MED ORDER — FLUCONAZOLE 150 MG PO TABS: 150.0000 mg | ORAL_TABLET | Freq: Once | ORAL | 0 refills | Status: AC

## 2019-10-28 MED ORDER — IOHEXOL 300 MG/ML  SOLN
100.0000 mL | Freq: Once | INTRAMUSCULAR | Status: AC | PRN
  Administered 2019-10-28: 100 mL via INTRAVENOUS
  Filled 2019-10-28: qty 100

## 2019-10-28 MED ORDER — ACETAMINOPHEN 500 MG PO TABS
1000.0000 mg | ORAL_TABLET | Freq: Once | ORAL | Status: AC
  Administered 2019-10-28: 1000 mg via ORAL
  Filled 2019-10-28: qty 2

## 2019-10-28 MED ORDER — ONDANSETRON HCL 4 MG/2ML IJ SOLN
INTRAMUSCULAR | Status: AC
  Administered 2019-10-28: 4 mg
  Filled 2019-10-28: qty 2

## 2019-10-28 MED ORDER — HYDROMORPHONE HCL 1 MG/ML IJ SOLN
0.5000 mg | Freq: Once | INTRAMUSCULAR | Status: AC
  Administered 2019-10-28: 0.5 mg via INTRAVENOUS
  Filled 2019-10-28: qty 1

## 2019-10-28 NOTE — Telephone Encounter (Signed)
She will need repeat UA with culture, please have her come today.

## 2019-10-28 NOTE — Telephone Encounter (Signed)
Patient stated she was advised to call back if her UTI was not improving She stated that she has been doing everything she was advised to do and it does not seem to be improving at all. Patient would like to know what she should do next

## 2019-10-28 NOTE — Telephone Encounter (Signed)
Spoke with patient via phone and we will proceed with stat CT renal stone study and labs. Patient verbalized understanding and we will initiate testing.

## 2019-10-28 NOTE — ED Notes (Signed)
Pt given soda as requested. Family remains at bedside.

## 2019-10-28 NOTE — ED Provider Notes (Signed)
[facility] EMERGENCY DEPARTMENT Provider Note   CSN: [id number] Arrival date & time: 10/28/19  1447     History   Chief Complaint Chief Complaint  Patient presents with   Pelvic Pain   Back Pain    HPI Vicki Cruz is a 28 y.o. female presents to the emergency department for evaluation of low back pain, flank pain, pelvic pain.  Pain severe, mild nausea.  Currently being treated for UTI with Bactrim.  Also on fluconazole for yeast vaginitis.  Has a history of recurrent UTIs, urology consult pending.  Was sent to the ED by PCP for continued persistent pain today.  Symptoms of UTI began around 10/16/2019.  Patient had recent urinalysis and STD work-up showing no gonorrhea chlamydia, trichomonas.  She has positive E. coli in urine with sensitivity to Bactrim, was placed on Bactrim 4 to 5 days ago and has not seen any improvement.  Patient has constant burning in the vaginal area with burning with urination.  No fevers.  No change in urinary output or flow.  She denies any abnormal vaginal discharge.  Pain is moderate to severe.  She is not having medications for pain.     HPI  Past Medical History:  Diagnosis Date   GAD (generalized anxiety disorder)    History of miscarriage    MDD (major depressive disorder) 08/25/2018   Migraines 05/12/2017   Preterm labor    1st pregnancy had to get medicine at 21 weeks because of labor   Tobacco abuse    quit: 06/06/2015    Patient Active Problem List   Diagnosis Date Noted   Tension headache 10/12/2019   Insomnia 10/12/2019   Diarrhea 10/12/2019   Recurrent cystitis 05/06/2019   MDD (major depressive disorder) 08/25/2018   GAD (generalized anxiety disorder) 05/12/2017   Migraines 05/12/2017   Tobacco abuse 06/11/2015    Past Surgical History:  Procedure Laterality Date   CESAREAN SECTION  2008   CESAREAN SECTION WITH BILATERAL TUBAL LIGATION Bilateral 02/04/2016   Procedure: REPEAT  CESAREAN SECTION WITH BILATERAL TUBAL LIGATION;  Surgeon: Hildred Laser, MD;  Location: [facility] ORS;  Service: Obstetrics;  Laterality: Bilateral;   IUD REMOVAL       OB History    Gravida  3   Para  2   Term  2   Preterm      AB  1   Living  2     SAB  1   TAB      Ectopic      Multiple  0   Live Births  2        Obstetric Comments  Failed IOL at 42 weeks         Home Medications    Prior to Admission medications   Medication Sig Start Date End Date Taking? Authorizing Provider  cephALEXin (KEFLEX) 500 MG capsule Take 1 capsule (500 mg total) by mouth 4 (four) times daily for 10 days. 10/28/19 11/07/19  Evon Slack, PA-C  doxycycline (ADOXA) 100 MG tablet Take 1 tablet (100 mg total) by mouth 2 (two) times daily for 14 days. 10/28/19 11/11/19  Evon Slack, PA-C  fluconazole (DIFLUCAN) 150 MG tablet Take 1 tablet (150 mg total) by mouth once for 1 dose. After completion of antibiotics 10/28/19 10/28/19  Evon Slack, PA-C  ondansetron (ZOFRAN ODT) 4 MG disintegrating tablet Take 1 tablet (4 mg total) by mouth every 8 (eight) hours as needed for nausea or  vomiting. 10/28/19   Evon Slack, PA-C  oxyCODONE-acetaminophen (PERCOCET) 5-325 MG tablet Take 1-2 tablets by mouth every 6 (six) hours as needed for severe pain. 10/28/19 10/27/20  Evon Slack, PA-C    Family History Family History  Problem Relation Age of Onset   Cancer Paternal Grandfather        unsure of what type   Graves' disease Maternal Grandmother     Social History Social History   Tobacco Use   Smoking status: Current Some Day Smoker    Types: Cigarettes   Smokeless tobacco: Never Used   Tobacco comment: occ cigarette  Substance Use Topics   Alcohol use: Yes    Alcohol/week: 0.0 standard drinks    Comment: ocass   Drug use: No     Allergies   Penicillin g and Penicillins   Review of Systems Review of Systems  Constitutional: Positive for chills.    Respiratory: Negative for cough and shortness of breath.   Cardiovascular: Negative for chest pain and leg swelling.  Gastrointestinal: Positive for nausea and vomiting. Negative for abdominal distention and abdominal pain.  Genitourinary: Positive for dysuria, flank pain, pelvic pain and vaginal pain. Negative for decreased urine volume, frequency, hematuria and urgency.  Musculoskeletal: Positive for back pain. Negative for arthralgias and joint swelling.  Neurological: Negative for dizziness, weakness and light-headedness.     Physical Exam Updated Vital Signs BP (!) 145/69 (BP Location: Left Arm)    Pulse (!) 104    Temp 99.3 F (37.4 C) (Oral)    Resp 20    Ht  (1.626 m)    Wt 77.1 kg    LMP 10/12/2019 (Approximate) Comment: neg preg   SpO2 96%    BMI 29.18 kg/m   Physical Exam Constitutional:      Appearance: She is well-developed.  HENT:     Head: Normocephalic and atraumatic.  Eyes:     Conjunctiva/sclera: Conjunctivae normal.  Neck:     Musculoskeletal: Normal range of motion.  Cardiovascular:     Rate and Rhythm: Normal rate.  Pulmonary:     Effort: Pulmonary effort is normal. No respiratory distress.     Breath sounds: No wheezing or rales.  Abdominal:     General: Bowel sounds are normal. There is no distension.     Tenderness: There is abdominal tenderness. There is right CVA tenderness and left CVA tenderness. There is no guarding.     Comments: Mild pelvic tenderness to palpation, abdomen pelvis soft nondistended  Genitourinary:    Comments: Positive purulent discharge along the cervix with very mild cervical motion tenderness.  No abnormal lesions or abscesses.  External vaginal soft tissues normal.  No foreign body. Musculoskeletal: Normal range of motion.  Skin:    General: Skin is warm.     Findings: No rash.  Neurological:     Mental Status: She is alert and oriented to person, place, and time.  Psychiatric:        Behavior: Behavior normal.         Thought Content: Thought content normal.      ED Treatments / Results  Labs (all labs ordered are listed, but only abnormal results are displayed) Labs Reviewed  WET PREP, GENITAL - Abnormal; Notable for the following components:      Result Value   WBC, Wet Prep HPF POC FEW (*)    All other components within normal limits  URINALYSIS, COMPLETE (UACMP) WITH MICROSCOPIC - Abnormal; Notable for  the following components:   Color, Urine ORANGE (*)    APPearance HAZY (*)    Glucose, UA   (*)    Value: TEST NOT REPORTED DUE TO COLOR INTERFERENCE OF URINE PIGMENT   Hgb urine dipstick   (*)    Value: TEST NOT REPORTED DUE TO COLOR INTERFERENCE OF URINE PIGMENT   Bilirubin Urine   (*)    Value: TEST NOT REPORTED DUE TO COLOR INTERFERENCE OF URINE PIGMENT   Ketones, ur   (*)    Value: TEST NOT REPORTED DUE TO COLOR INTERFERENCE OF URINE PIGMENT   Protein, ur   (*)    Value: TEST NOT REPORTED DUE TO COLOR INTERFERENCE OF URINE PIGMENT   Nitrite   (*)    Value: TEST NOT REPORTED DUE TO COLOR INTERFERENCE OF URINE PIGMENT   Leukocytes,Ua   (*)    Value: TEST NOT REPORTED DUE TO COLOR INTERFERENCE OF URINE PIGMENT   Bacteria, UA RARE (*)    All other components within normal limits  CBC - Abnormal; Notable for the following components:   WBC 15.9 (*)    RBC 5.16 (*)    All other components within normal limits  URINE CULTURE  GC/CHLAMYDIA PROBE AMP  LACTIC ACID, PLASMA  POC URINE PREG, ED  POCT PREGNANCY, URINE    EKG None  Radiology Ct Abdomen Pelvis W Contrast  Result Date: 10/28/2019 CLINICAL DATA:  Abdominal pain. Fever. Low back pain. EXAM: CT ABDOMEN AND PELVIS WITH CONTRAST TECHNIQUE: Multidetector CT imaging of the abdomen and pelvis was performed using the standard protocol following bolus administration of intravenous contrast. 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 Otherwise unremarkable. Stomach/Bowel: Unremarkable. Normal appendix. Vascular/Lymphatic: Unremarkable Reproductive: Unremarkable Other: No supplemental non-categorized findings. Musculoskeletal: Unremarkable IMPRESSION: 1. A cause for the patient's abdominal pain is not identified. 2. 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 Electronically Signed   By: Gaylyn RongWalter  Liebkemann M.D.   On: 10/28/2019 18:04    Procedures Procedures (including critical care time)  Medications Ordered in ED Medications  ondansetron (ZOFRAN) injection 4 mg (4 mg Intravenous Not Given 10/28/19 1733)  HYDROmorphone (DILAUDID) injection 0.5 mg (has no administration in time range)  doxycycline (VIBRA-TABS) tablet 100 mg (has no administration in time range)  ondansetron (ZOFRAN) injection 4 mg (has no administration in time range)  sodium chloride 0.9 % bolus 1,000 mL (0 mLs Intravenous Stopped 10/28/19 1832)  acetaminophen (TYLENOL) tablet 1,000 mg (1,000 mg Oral Given 10/28/19 1652)  morphine 4 MG/ML injection 4 mg (4 mg Intravenous Given 10/28/19 1702)  ondansetron (ZOFRAN) 4 MG/2ML injection (4 mg  Given 10/28/19 1701)  cefTRIAXone (ROCEPHIN) 1 g in sodium chloride 0.9 % 100 mL IVPB (0 g Intravenous Stopped 10/28/19 1817)  iohexol (OMNIPAQUE) 300 MG/ML solution 100 mL (100 mLs Intravenous Contrast Given 10/28/19 1733)  HYDROmorphone (DILAUDID) injection 1 mg (1 mg Intravenous Given 10/28/19 1746)     Initial Impression / Assessment and Plan / ED Course  I have reviewed the triage vital signs and the nursing notes.  Pertinent labs & imaging results that were available during my care of the patient were reviewed by me and considered in my medical decision making (see chart for details).        28 year old female currently being treated for UTI presents to  the emergency department for increasing pelvic pain, back pain, nausea with low-grade fever.  She was given pain medication and nausea medication along with IV fluids.  Urine pregnancy test negative.  Urinalysis inconclusive today due to large amounts of Azo being taken today.  Recent urinalysis showed positive E. coli with a list of sensitivities.  CT abdomen pelvis was performed and negative.  Vaginal exam wet prep performed showing WBCs but no BV, candidiasis or trichomonas.  Patient's history and exam concerning for PID and pyelonephritis.  She was given 1 g of ceftriaxone IV, tolerated this well.  She will be sent home with doxycycline and cephalexin.  She will be given a prescription for fluconazole to take after completion of antibiotics.  She is given nausea medication and pain medication.  She understands signs and symptoms return to the ED for such as any increasing nausea or vomiting, increasing pain, urinary symptoms, fevers or any worsening symptoms or urgent changes in health.  Final Clinical Impressions(s) / ED Diagnoses   Final diagnoses:  Pyelonephritis  PID (acute pelvic inflammatory disease)    ED Discharge Orders         Ordered    doxycycline (ADOXA) 100 MG tablet  2 times daily     10/28/19 2019    cephALEXin (KEFLEX) 500 MG capsule  4 times daily     10/28/19 2019    ondansetron (ZOFRAN ODT) 4 MG disintegrating tablet  Every 8 hours PRN     10/28/19 2019    fluconazole (DIFLUCAN) 150 MG tablet   Once     10/28/19 2019    oxyCODONE-acetaminophen (PERCOCET) 5-325 MG tablet  Every 6 hours PRN     10/28/19 2019           Galilee, Pierron 10/28/19 2059    Delman Kitten, MD 10/28/19 2137

## 2019-10-28 NOTE — ED Notes (Signed)
Pt remains off unit at CT.  

## 2019-10-28 NOTE — Discharge Instructions (Addendum)
Please take antibiotics as prescribed.  Make sure you are drinking lots of fluids.  If any fevers above 101 return to the emergency department.  Also return to the emergency department for any increasing pain, nausea, vomiting, inability to tolerate p.o., worsening symptoms or urgent changes in health.

## 2019-10-28 NOTE — ED Triage Notes (Signed)
Pt reports pelvic pain and back pain lower. Pt reports had blood drawn at [facility] and was sent to the ED for a CT scan.

## 2019-10-28 NOTE — ED Notes (Signed)
See triage note  Presents with lower back pain  States she was recently treated for UTI and yeast infection  States pain has increased today  Describes pain as sharp and burning  Was seen by her PCP today

## 2019-10-28 NOTE — ED Notes (Signed)
This RN at bedside with provider. Wet prep sent to lab. Pt providing urine sample now to send to lab.

## 2019-10-28 NOTE — ED Provider Notes (Signed)
Medical screening examination/treatment/procedure(s) were conducted as a shared visit with non-physician practitioner(s) and myself.  I personally evaluated the patient during the encounter.     Delman Kitten, MD 10/28/19 2137

## 2019-10-28 NOTE — Telephone Encounter (Addendum)
Spoken to patient and schedule lab appointment. Due to her pain, patient is coming in for labwork, UA and culture. Per Allie Bossier , patient is getting a CT scan as well.

## 2019-10-29 LAB — URINE CULTURE
MICRO NUMBER:: 1099044
Result:: NO GROWTH
SPECIMEN QUALITY:: ADEQUATE

## 2019-10-30 LAB — URINE CULTURE
Culture: <10000 — AB
Special Requests: NORMAL

## 2019-10-31 ENCOUNTER — Telehealth: Payer: Self-pay

## 2019-10-31 NOTE — Telephone Encounter (Signed)
Spoke with patient after recent ER visit. Patient states she has an appointment with Dr. Sharol Harness [facility] 11/09/2019. Patient was going to wait and follow up with Anda Kraft after that appointment unless something comes up before then. Patient is taking all of the medications that were given at the ER to her as directed.  Patient was asking about her GC test, I see it is still in progress and not results and she wanted to know the results when it is finalized if they are not yet. Thank you

## 2019-10-31 NOTE — Telephone Encounter (Signed)
Noted. Agree that follow up with me is not necessary, I have caught up with patient via My Chart.

## 2019-11-14 ENCOUNTER — Other Ambulatory Visit (HOSPITAL_COMMUNITY): Payer: Self-pay | Admitting: Urology

## 2019-11-14 ENCOUNTER — Other Ambulatory Visit: Payer: Self-pay | Admitting: Urology

## 2019-11-14 DIAGNOSIS — N12 Tubulo-interstitial nephritis, not specified as acute or chronic: Secondary | ICD-10-CM

## 2019-11-22 ENCOUNTER — Ambulatory Visit (HOSPITAL_COMMUNITY)
Admission: RE | Admit: 2019-11-22 | Discharge: 2019-11-22 | Disposition: A | Discharge status: Home / Self Care | Payer: Commercial Managed Care - PPO | Source: Ambulatory Visit | Attending: Urology | Admitting: Urology

## 2019-11-22 ENCOUNTER — Other Ambulatory Visit: Payer: Self-pay

## 2019-11-22 DIAGNOSIS — N12 Tubulo-interstitial nephritis, not specified as acute or chronic: Secondary | ICD-10-CM

## 2019-11-22 MED ORDER — IOHEXOL 300 MG/ML  SOLN
125.0000 mL | Freq: Once | INTRAMUSCULAR | Status: AC | PRN
  Administered 2019-11-22: 125 mL via URETHRAL

## 2020-06-22 ENCOUNTER — Telehealth: Payer: Self-pay

## 2020-06-22 NOTE — Telephone Encounter (Signed)
Pt said for 3 days she has had swelling in both lower legs and feet; the swelling does go down overnight but pt said the lower legs are so swollen they feel like they are going to explode.pts lower legs do not hurt but has pulsating and tingling feeling.for few wks pt having new symptom of SOB upon exertion. No CP and no redness in lower legs. Pt did go to [facility] on 06/21/20 and UC was going to send pt for [place] due to concern for DVT but pt could not go because she had her children with her. Pt has been eating salt but no more than usual. Pt has already scheduled appt with Allayne Gitelman NP on 06/25/20 at 12 noon. I spoke with Allayne Gitelman NP and if condition worsening then pt should go to UC for eval and call [facility] on 06/25/20 in morning with update on how pt is doing and what UC did and recommended to see if pt needs to keep appt with Jae Dire or not on 06/25/20. Pt voiced understanding and will go to UC in [place]. FYI to Allayne Gitelman NP.

## 2020-06-22 NOTE — Telephone Encounter (Signed)
Noted  

## 2020-06-25 ENCOUNTER — Ambulatory Visit: Payer: Commercial Managed Care - PPO | Admitting: Primary Care

## 2020-06-25 DIAGNOSIS — Z0289 Encounter for other administrative examinations: Secondary | ICD-10-CM

## 2021-11-05 ENCOUNTER — Other Ambulatory Visit: Payer: Self-pay

## 2021-11-05 ENCOUNTER — Emergency Department
Admission: EM | Admit: 2021-11-05 | Discharge: 2021-11-05 | Disposition: A | Discharge status: Home / Self Care | Payer: Commercial Managed Care - PPO | Attending: Emergency Medicine | Admitting: Emergency Medicine

## 2021-11-05 ENCOUNTER — Emergency Department: Payer: Commercial Managed Care - PPO

## 2021-11-05 DIAGNOSIS — R110 Nausea: Secondary | ICD-10-CM | POA: Diagnosis not present

## 2021-11-05 DIAGNOSIS — D72829 Elevated white blood cell count, unspecified: Secondary | ICD-10-CM | POA: Insufficient documentation

## 2021-11-05 DIAGNOSIS — M791 Myalgia, unspecified site: Secondary | ICD-10-CM | POA: Insufficient documentation

## 2021-11-05 DIAGNOSIS — R109 Unspecified abdominal pain: Secondary | ICD-10-CM | POA: Diagnosis present

## 2021-11-05 DIAGNOSIS — F17210 Nicotine dependence, cigarettes, uncomplicated: Secondary | ICD-10-CM | POA: Diagnosis not present

## 2021-11-05 DIAGNOSIS — Z20822 Contact with and (suspected) exposure to covid-19: Secondary | ICD-10-CM | POA: Diagnosis not present

## 2021-11-05 DIAGNOSIS — R52 Pain, unspecified: Secondary | ICD-10-CM

## 2021-11-05 LAB — CBC WITH DIFFERENTIAL/PLATELET
Abs Immature Granulocytes: 0.11 10*3/uL — ABNORMAL HIGH (ref 0.00–0.07)
Basophils Absolute: 0 10*3/uL (ref 0.0–0.1)
Basophils Relative: 0 %
Eosinophils Absolute: 0 10*3/uL (ref 0.0–0.5)
Eosinophils Relative: 0 %
HCT: 37.4 % (ref 36.0–46.0)
Hemoglobin: 12.4 g/dL (ref 12.0–15.0)
Immature Granulocytes: 1 %
Lymphocytes Relative: 7 %
Lymphs Abs: 1.2 10*3/uL (ref 0.7–4.0)
MCH: 28.5 pg (ref 26.0–34.0)
MCHC: 33.2 g/dL (ref 30.0–36.0)
MCV: 86 fL (ref 80.0–100.0)
Monocytes Absolute: 0.9 10*3/uL (ref 0.1–1.0)
Monocytes Relative: 5 %
Neutro Abs: 15.3 10*3/uL — ABNORMAL HIGH (ref 1.7–7.7)
Neutrophils Relative %: 87 %
Platelets: 163 10*3/uL (ref 150–400)
RBC: 4.35 MIL/uL (ref 3.87–5.11)
RDW: 14.3 % (ref 11.5–15.5)
WBC: 17.6 10*3/uL — ABNORMAL HIGH (ref 4.0–10.5)
nRBC: 0 % (ref 0.0–0.2)

## 2021-11-05 LAB — URINALYSIS, ROUTINE W REFLEX MICROSCOPIC
Bacteria, UA: NONE SEEN
Bilirubin Urine: NEGATIVE
Glucose, UA: NEGATIVE mg/dL
Ketones, ur: NEGATIVE mg/dL
Leukocytes,Ua: NEGATIVE
Nitrite: NEGATIVE
Protein, ur: NEGATIVE mg/dL
Specific Gravity, Urine: 1.021 (ref 1.005–1.030)
pH: 5 (ref 5.0–8.0)

## 2021-11-05 LAB — RESP PANEL BY RT-PCR (FLU A&B, COVID) ARPGX2
Influenza A by PCR: NEGATIVE
Influenza B by PCR: NEGATIVE
SARS Coronavirus 2 by RT PCR: NEGATIVE

## 2021-11-05 LAB — CBC
HCT: 42.5 % (ref 36.0–46.0)
Hemoglobin: 14.3 g/dL (ref 12.0–15.0)
MCH: 28.7 pg (ref 26.0–34.0)
MCHC: 33.6 g/dL (ref 30.0–36.0)
MCV: 85.3 fL (ref 80.0–100.0)
Platelets: 197 10*3/uL (ref 150–400)
RBC: 4.98 MIL/uL (ref 3.87–5.11)
RDW: 14.2 % (ref 11.5–15.5)
WBC: 21.6 10*3/uL — ABNORMAL HIGH (ref 4.0–10.5)
nRBC: 0 % (ref 0.0–0.2)

## 2021-11-05 LAB — COMPREHENSIVE METABOLIC PANEL
ALT: 33 U/L (ref 0–44)
AST: 28 U/L (ref 15–41)
Albumin: 4.7 g/dL (ref 3.5–5.0)
Alkaline Phosphatase: 58 U/L (ref 38–126)
Anion gap: 6 (ref 5–15)
BUN: 17 mg/dL (ref 6–20)
CO2: 23 mmol/L (ref 22–32)
Calcium: 8.9 mg/dL (ref 8.9–10.3)
Chloride: 106 mmol/L (ref 98–111)
Creatinine, Ser: 0.7 mg/dL (ref 0.44–1.00)
GFR, Estimated: >60 mL/min (ref >60)
Glucose, Bld: 133 mg/dL — ABNORMAL HIGH (ref 70–99)
Potassium: 4.1 mmol/L (ref 3.5–5.1)
Sodium: 135 mmol/L (ref 135–145)
Total Bilirubin: 0.9 mg/dL (ref 0.3–1.2)
Total Protein: 7.9 g/dL (ref 6.5–8.1)

## 2021-11-05 LAB — PROCALCITONIN: Procalcitonin: <0.1 ng/mL

## 2021-11-05 LAB — POC URINE PREG, ED: Preg Test, Ur: NEGATIVE

## 2021-11-05 LAB — GROUP A STREP BY PCR: Group A Strep by PCR: NOT DETECTED

## 2021-11-05 LAB — LACTIC ACID, PLASMA: Lactic Acid, Venous: 1.2 mmol/L (ref 0.5–1.9)

## 2021-11-05 LAB — MONONUCLEOSIS SCREEN: Mono Screen: NEGATIVE

## 2021-11-05 LAB — LIPASE, BLOOD: Lipase: 35 U/L (ref 11–51)

## 2021-11-05 MED ORDER — ONDANSETRON HCL 4 MG/2ML IJ SOLN
4.0000 mg | Freq: Once | INTRAMUSCULAR | Status: AC
  Administered 2021-11-05: 4 mg via INTRAVENOUS
  Filled 2021-11-05: qty 2

## 2021-11-05 MED ORDER — FENTANYL CITRATE PF 50 MCG/ML IJ SOSY
50.0000 ug | PREFILLED_SYRINGE | Freq: Once | INTRAMUSCULAR | Status: AC
  Administered 2021-11-05: 50 ug via INTRAVENOUS
  Filled 2021-11-05: qty 1

## 2021-11-05 MED ORDER — ACETAMINOPHEN 500 MG PO TABS
1000.0000 mg | ORAL_TABLET | Freq: Once | ORAL | Status: AC
  Administered 2021-11-05: 1000 mg via ORAL
  Filled 2021-11-05: qty 2

## 2021-11-05 MED ORDER — HYDROMORPHONE HCL 1 MG/ML IJ SOLN
0.5000 mg | Freq: Once | INTRAMUSCULAR | Status: AC
  Administered 2021-11-05: 0.5 mg via INTRAVENOUS
  Filled 2021-11-05: qty 1

## 2021-11-05 MED ORDER — SODIUM CHLORIDE 0.9 % IV BOLUS
1000.0000 mL | Freq: Once | INTRAVENOUS | Status: AC
  Administered 2021-11-05: 1000 mL via INTRAVENOUS

## 2021-11-05 MED ORDER — KETOROLAC TROMETHAMINE 30 MG/ML IJ SOLN
15.0000 mg | Freq: Once | INTRAMUSCULAR | Status: AC
  Administered 2021-11-05: 15 mg via INTRAVENOUS
  Filled 2021-11-05: qty 1

## 2021-11-05 NOTE — Discharge Instructions (Addendum)
Your work-up was reassuring other than an elevated white count.  The white count can be elevated for many different reasons sometimes it can be from a bacterial infection but I do not see any signs of that on examination.  Also possible could be elevated from a viral infection as well.  If you develop fevers you need to return to the ER immediately for repeat evaluation.  We discussed doing blood cultures but the decision was made to hold off on this time and monitor your symptoms at home.  If things are getting worse please return immediately for repeat evaluation due to your elevated white count.

## 2021-11-05 NOTE — ED Triage Notes (Signed)
Pt c/o BL flank pain with painful urination with N/V for the past couple of days

## 2021-11-05 NOTE — ED Provider Notes (Addendum)
[facility] Emergency Department Provider Note  ____________________________________________   Event Date/Time   First MD Initiated Contact with Patient 11/05/21 1036     (approximate)  I have reviewed the triage vital signs and the nursing notes.   HISTORY  Chief Complaint Flank Pain    HPI Vicki Cruz is a 30 y.o. female with history of kidney infections who comes in with concerns for flank pain.  Patient reports having 3 to 4 days of dysuria and taking some Azo as well as taking some medication that urology had prescribed her to help prevent getting UTIs.  It appears this was trimethoprim but that was earlier last week denies taking anything last few days.  Patient reports that she developed bilateral flank pain, constant, nothing makes it better or worse.  Associate with some nausea.  Denies any abdominal pain.  She was worried that she may have a UTI       Past Medical History:  Diagnosis Date   GAD (generalized anxiety disorder)    History of miscarriage    MDD (major depressive disorder) 08/25/2018   Migraines 05/12/2017   Preterm labor    1st pregnancy had to get medicine at 21 weeks because of labor   Tobacco abuse    quit: 06/06/2015    Patient Active Problem List   Diagnosis Date Noted   Tension headache 10/12/2019   Insomnia 10/12/2019   Diarrhea 10/12/2019   Recurrent cystitis 05/06/2019   MDD (major depressive disorder) 08/25/2018   GAD (generalized anxiety disorder) 05/12/2017   Migraines 05/12/2017   Tobacco abuse 06/11/2015    Past Surgical History:  Procedure Laterality Date   CESAREAN SECTION  2008   CESAREAN SECTION WITH BILATERAL TUBAL LIGATION Bilateral 02/04/2016   Procedure: REPEAT CESAREAN SECTION WITH BILATERAL TUBAL LIGATION;  Surgeon: Hildred Laser, MD;  Location: [facility] ORS;  Service: Obstetrics;  Laterality: Bilateral;   IUD REMOVAL      Prior to Admission medications   Medication Sig Start Date End Date  Taking? Authorizing Provider  ondansetron (ZOFRAN ODT) 4 MG disintegrating tablet Take 1 tablet (4 mg total) by mouth every 8 (eight) hours as needed for nausea or vomiting. 10/28/19   Evon Slack, PA-C    Allergies Penicillin g and Penicillins  Family History  Problem Relation Age of Onset   Cancer Paternal Grandfather        unsure of what type   Graves' disease Maternal Grandmother     Social History Social History   Tobacco Use   Smoking status: Some Days    Types: Cigarettes   Smokeless tobacco: Never   Tobacco comments:    occ cigarette  Vaping Use   Vaping Use: Former  Substance Use Topics   Alcohol use: Yes    Alcohol/week: 0.0 standard drinks    Comment: ocass   Drug use: No      Review of Systems Constitutional: No fever/chills Eyes: No visual changes. ENT: No sore throat. Cardiovascular: Denies chest pain. Respiratory: Denies shortness of breath. Gastrointestinal: No abdominal pain.  No nausea, no vomiting.  No diarrhea.  No constipation. Genitourinary: Positive dysuria Musculoskeletal: Positive back pain Skin: Negative for rash. Neurological: Negative for headaches, focal weakness or numbness. All other ROS negative ____________________________________________   PHYSICAL EXAM:  VITAL SIGNS: ED Triage Vitals [11/05/21 1013]  Enc Vitals Group     BP (!) 137/95     Pulse Rate (!) 114     Resp 20  Temp 98.7 F (37.1 C)     Temp Source Oral     SpO2 100 %     Weight 159 lb (72.1 kg)     Height 5\' 4"  (1.626 m)     Head Circumference      Peak Flow      Pain Score 8     Pain Loc      Pain Edu?      Excl. in GC?     Constitutional: Alert and oriented.  Appears uncomfortable Eyes: Conjunctivae are normal. EOMI. Head: Atraumatic. Nose: No congestion/rhinnorhea. Mouth/Throat: Mucous membranes are moist.  OP clear Neck: No stridor. Trachea Midline. FROM Cardiovascular: Tachycardic, regular rhythm. Grossly normal heart sounds.  Good  peripheral circulation. Respiratory: Normal respiratory effort.  No retractions. Lungs CTAB. Gastrointestinal: Soft and nontender. No distention. No abdominal bruits.  Musculoskeletal: No lower extremity tenderness nor edema.  No joint effusions. Neurologic:  Normal speech and language. No gross focal neurologic deficits are appreciated.  Skin:  Skin is warm, dry and intact. No rash noted. Psychiatric: Mood and affect are normal. Speech and behavior are normal. GU: Deferred  Positive CVA tenderness bilaterally ____________________________________________   LABS (all labs ordered are listed, but only abnormal results are displayed)  Labs Reviewed  COMPREHENSIVE METABOLIC PANEL - Abnormal; Notable for the following components:      Result Value   Glucose, Bld 133 (*)    All other components within normal limits  CBC - Abnormal; Notable for the following components:   WBC 21.6 (*)    All other components within normal limits  URINALYSIS, ROUTINE W REFLEX MICROSCOPIC - Abnormal; Notable for the following components:   Color, Urine YELLOW (*)    APPearance CLEAR (*)    Hgb urine dipstick LARGE (*)    All other components within normal limits  RESP PANEL BY RT-PCR (FLU A&B, COVID) [id number]  GROUP A STREP BY PCR  URINE CULTURE  LIPASE, BLOOD  MONONUCLEOSIS SCREEN  POC URINE PREG, ED   ____________________________________________   ED ECG REPORT I, , the attending physician, personally viewed and interpreted this ECG.  Normal sinus rate of 97, no ST elevation, no T wave inversions, normal intervals ____________________________________________  RADIOLOGY Concha Se, personally viewed and evaluated these images (plain radiographs) as part of my medical decision making, as well as reviewing the written report by the radiologist.  ED MD interpretation no pneumonia  Official radiology report(s): DG Chest 2 View  Result Date: 11/05/2021 CLINICAL DATA:  Shortness  of breath, flank pain EXAM: CHEST - 2 VIEW COMPARISON:  Chest radiograph 11/16/2017 FINDINGS: The cardiomediastinal silhouette is normal. The lungs are clear, without focal consolidation or pulmonary edema. There is no pleural effusion or pneumothorax. There is no acute osseous abnormality. IMPRESSION: No radiographic evidence of acute cardiopulmonary process. Electronically Signed   By: 14/02/2017 M.D.   On: 11/05/2021 13:19   CT Renal Stone Study  Result Date: 11/05/2021 CLINICAL DATA:  Bilateral flank pain with painful urination, nausea and vomiting for the past couple days. Kidney stone suspected. EXAM: CT ABDOMEN AND PELVIS WITHOUT CONTRAST TECHNIQUE: Multidetector CT imaging of the abdomen and pelvis was performed following the standard protocol without IV contrast. COMPARISON:  Abdominopelvic CT 10/28/2019. FINDINGS: Lower chest: Clear lung bases. No significant pleural or pericardial effusion. Hepatobiliary: The liver appears unremarkable as imaged in the noncontrast state. No evidence of gallstones, gallbladder wall thickening or biliary dilatation. Pancreas: Unremarkable. No pancreatic ductal dilatation  or surrounding inflammatory changes. Spleen: Normal in size without focal abnormality. Adrenals/Urinary Tract: Both adrenal glands appear normal. No evidence of urinary tract calculus, hydronephrosis or perinephric soft tissue stranding. 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 The bladder appears unremarkable. Stomach/Bowel: No enteric contrast administered. The stomach appears unremarkable for its degree of distension. No evidence of bowel wall thickening, distention or surrounding inflammatory change. The appendix appears normal. Vascular/Lymphatic: There are no enlarged abdominal or pelvic lymph nodes. No significant vascular findings on noncontrast imaging. Reproductive: The uterus and ovaries appear unremarkable. Vaginal tampon in place. Other: No evidence of  abdominal wall mass or hernia. No ascites. Musculoskeletal: No acute or significant osseous findings. IMPRESSION: 1. No acute findings or explanation for the patient's symptoms on noncontrast CT. 2. No evidence of urinary tract calculus or hydronephrosis. Electronically Signed   By: Carey Bullocks M.D.   On: 11/05/2021 13:14    ____________________________________________   PROCEDURES  Procedure(s) performed (including Critical Care):  Procedures   ____________________________________________   INITIAL IMPRESSION / ASSESSMENT AND PLAN / ED COURSE  Vicki Cruz was evaluated in Emergency Department on 11/05/2021 for the symptoms described in the history of present illness. She was evaluated in the context of the global COVID-19 pandemic, which necessitated consideration that the patient might be at risk for infection with the SARS-CoV-2 virus that causes COVID-19. Institutional protocols and algorithms that pertain to the evaluation of patients at risk for COVID-19 are in a state of rapid change based on information released by regulatory bodies including the CDC and federal and state organizations. These policies and algorithms were followed during the patient's care in the ED.    Patient comes in with concerns for UTI leading to pyelonephritis.  Patient does have positive CVA tenderness.  She denies a history of kidney stones and has had negative CT imaging previously back in 2020 that have been negative for any kidney stones.  Patient does appear quite tachycardic so give some fluids, Zofran.  No abdominal pain to suggest appendicitis, gallbladder pathology etc.  Patient's urine is very reassuring but does have some RBCs in it.  However patient states that she is currently on her period.  Her work-up otherwise though is very reassuring.  Although her white count is elevated at 21.6.  We will send a strep test, COVID test and get a CT to look for any evidence of pyelonephritis/kidney  stone.    We will send her urine for urine culture in case there was some sterilization from the trimethoprim that she had earlier.  She has no risk factors for bacteremia.  Denies any tooth infections, skin infections, IV drug use.  Offered to do blood cultures and give a dose of antibiotics but patient is now stating that when she woke up this morning that she had a sore throat and that it was like generalized body aches and that she feels like she might just have the flu.  She is declining blood cultures/antibiotics.  We also discussed admission to monitor her but she again declines. She states that she is feeling much better at this time and denies any continued dysuria since going to the bathroom here.  She does now report that she was recently in close contact with a family member who is RSV positive.  We will also send testing for mono given the sore throat I do not see any evidence of PTA or RPA on examination.  Other than the white count elevation  she is otherwise very well-appearing with soft abdomen.  She does report recent STD testing that was all negative and denies any lower pelvic pain/vaginal discharge and denies any new partners.  At this time I have low suspicion for bacteremia and again she declines blood cultures.  She is comfortable with discharge home and will return if she develops fevers or worsening symptoms.  Her heart rates have normalized and she appears much more comfortable on examination   If her Urine culture is positive she would need to be started on antibiotics.   Patient reporting a little bit more of body aches.  Not just situated to her back.  Her abdomen remains soft and nontender.  She is going to stay to have a procalcitonin, lactate, differential done to help figure out if this is something that could be bacterial in nature.  She will be headed off to oncoming team pending these results and repeat evaluation        ____________________________________________   FINAL CLINICAL IMPRESSION(S) / ED DIAGNOSES   Final diagnoses:  Leukocytosis, unspecified type  Body aches      MEDICATIONS GIVEN DURING THIS VISIT:  Medications  fentaNYL (SUBLIMAZE) injection 50 mcg (50 mcg Intravenous Given 11/05/21 1050)  ondansetron (ZOFRAN) injection 4 mg (4 mg Intravenous Given 11/05/21 1050)  sodium chloride 0.9 % bolus 1,000 mL (0 mLs Intravenous Stopped 11/05/21 1220)  HYDROmorphone (DILAUDID) injection 0.5 mg (0.5 mg Intravenous Given 11/05/21 1109)  ketorolac (TORADOL) 30 MG/ML injection 15 mg (15 mg Intravenous Given 11/05/21 1407)     ED Discharge Orders     None        Note:  This document was prepared using Dragon voice recognition software and may include unintentional dictation errors.    Concha Se, MD 11/05/21 1422    Concha Se, MD 11/05/21 1446    Concha Se, MD 11/05/21 [phone]

## 2021-11-05 NOTE — ED Notes (Signed)
Pt verbalized understanding of discharge paperwork and follow-up care. Ambulatory upon discharge.

## 2021-11-06 LAB — URINE CULTURE: Culture: 5000 — AB

## 2022-04-21 ENCOUNTER — Other Ambulatory Visit: Payer: Self-pay

## 2022-04-21 ENCOUNTER — Encounter: Payer: Self-pay | Admitting: *Deleted

## 2022-04-21 ENCOUNTER — Emergency Department: Payer: Commercial Managed Care - PPO

## 2022-04-21 ENCOUNTER — Emergency Department
Admission: EM | Admit: 2022-04-21 | Discharge: 2022-04-21 | Disposition: A | Discharge status: Home / Self Care | Payer: Commercial Managed Care - PPO | Attending: Emergency Medicine | Admitting: Emergency Medicine

## 2022-04-21 DIAGNOSIS — R079 Chest pain, unspecified: Secondary | ICD-10-CM

## 2022-04-21 DIAGNOSIS — R0789 Other chest pain: Secondary | ICD-10-CM | POA: Diagnosis present

## 2022-04-21 DIAGNOSIS — R0602 Shortness of breath: Secondary | ICD-10-CM | POA: Insufficient documentation

## 2022-04-21 DIAGNOSIS — F411 Generalized anxiety disorder: Secondary | ICD-10-CM | POA: Diagnosis not present

## 2022-04-21 DIAGNOSIS — F32A Depression, unspecified: Secondary | ICD-10-CM | POA: Insufficient documentation

## 2022-04-21 DIAGNOSIS — F410 Panic disorder [episodic paroxysmal anxiety] without agoraphobia: Secondary | ICD-10-CM | POA: Diagnosis not present

## 2022-04-21 DIAGNOSIS — R202 Paresthesia of skin: Secondary | ICD-10-CM | POA: Insufficient documentation

## 2022-04-21 LAB — BASIC METABOLIC PANEL
Anion gap: 8 (ref 5–15)
BUN: 16 mg/dL (ref 6–20)
CO2: 25 mmol/L (ref 22–32)
Calcium: 9 mg/dL (ref 8.9–10.3)
Chloride: 105 mmol/L (ref 98–111)
Creatinine, Ser: 0.7 mg/dL (ref 0.44–1.00)
GFR, Estimated: >60 mL/min (ref >60)
Glucose, Bld: 114 mg/dL — ABNORMAL HIGH (ref 70–99)
Potassium: 3.5 mmol/L (ref 3.5–5.1)
Sodium: 138 mmol/L (ref 135–145)

## 2022-04-21 LAB — CBC
HCT: 39.5 % (ref 36.0–46.0)
Hemoglobin: 12.9 g/dL (ref 12.0–15.0)
MCH: 26.9 pg (ref 26.0–34.0)
MCHC: 32.7 g/dL (ref 30.0–36.0)
MCV: 82.5 fL (ref 80.0–100.0)
Platelets: 246 10*3/uL (ref 150–400)
RBC: 4.79 MIL/uL (ref 3.87–5.11)
RDW: 14.6 % (ref 11.5–15.5)
WBC: 8.6 10*3/uL (ref 4.0–10.5)
nRBC: 0 % (ref 0.0–0.2)

## 2022-04-21 LAB — TROPONIN I (HIGH SENSITIVITY): Troponin I (High Sensitivity): <2 ng/L (ref <18)

## 2022-04-21 NOTE — ED Triage Notes (Signed)
Pt reports panic attacks and chest pain for 3 days.  Ems on scene 2 days ago for similar sx.  No n/v.  Pt did not take anxiety meds for past 2 days.  Pt alert  speech clear.   ?

## 2022-04-21 NOTE — Discharge Instructions (Signed)
Your blood work x-ray and EKG were all reassuring today.  Please follow-up with your primary care provider regarding management of your anxiety. ?

## 2022-04-21 NOTE — ED Provider Notes (Signed)
? ?[facility] ?Provider Note ? ? ? Event Date/Time  ? First MD Initiated Contact with Patient 04/21/22 1837   ?  (approximate) ? ? ?History  ? ?Chest Pain and Panic Attack ? ? ?HPI ? ?Vicki Cruz is a 31 y.o. female  with pmh anxiety disorder, depression, migraines who presents with chest pressure.  Patient notes that her panic attacks have been severe lately specifically over the weekend had multiple panic attacks.  She experiences paresthesias and shortness of breath hyperventilation and chest tightness.  Has been taking buspirone and alprazolam.  This morning she continued to have the chest tightness even though she feels like her anxiety is improved.  Is constant nonexertional and nonpleuritic actually feels better with deep breathing.  She did feel somewhat short of breath this morning but that has resolved.The patient denies hx of prior DVT/PE, unilateral leg pain/swelling, hormone use, recent surgery, hx of cancer, prolonged immobilization, or hemoptysis.   ? ?  ? ?Past Medical History:  ?Diagnosis Date  ? GAD (generalized anxiety disorder)   ? History of miscarriage   ? MDD (major depressive disorder) 08/25/2018  ? Migraines 05/12/2017  ? Preterm labor   ? 1st pregnancy had to get medicine at 21 weeks because of labor  ? Tobacco abuse   ? quit: 06/06/2015  ? ? ?Patient Active Problem List  ? Diagnosis Date Noted  ? Tension headache 10/12/2019  ? Insomnia 10/12/2019  ? Diarrhea 10/12/2019  ? Recurrent cystitis 05/06/2019  ? MDD (major depressive disorder) 08/25/2018  ? GAD (generalized anxiety disorder) 05/12/2017  ? Migraines 05/12/2017  ? Tobacco abuse 06/11/2015  ? ? ? ?Physical Exam  ?Triage Vital Signs: ?ED Triage Vitals  ?Enc Vitals Group  ?   BP 04/21/22 1645 119/80  ?   Pulse Rate 04/21/22 1645 81  ?   Resp 04/21/22 1645 18  ?   Temp 04/21/22 1645 99.3 ?F (37.4 ?C)  ?   Temp Source 04/21/22 1645 Oral  ?   SpO2 04/21/22 1645 100 %  ?   Weight 04/21/22 1641 150 lb (68 kg)  ?    Height 04/21/22 1641 5\' 4"  (1.626 m)  ?   Head Circumference --   ?   Peak Flow --   ?   Pain Score 04/21/22 1640 0  ?   Pain Loc --   ?   Pain Edu? --   ?   Excl. in GC? --   ? ? ?Most recent vital signs: ?Vitals:  ? 04/21/22 1645 04/21/22 1845  ?BP: 119/80 120/78  ?Pulse: 81 80  ?Resp: 18 18  ?Temp: 99.3 ?F (37.4 ?C)   ?SpO2: 100% 100%  ? ? ? ?General: Awake, no distress.  ?CV:  Good peripheral perfusion.  ?Resp:  Normal effort. Lungs are clear ?Abd:  No distention.  ?Neuro:             Awake, Alert, Oriented x 3  ?Other:  No peripheral edema or assymetry  ? ? ?ED Results / Procedures / Treatments  ?Labs ?(all labs ordered are listed, but only abnormal results are displayed) ?Labs Reviewed  ?BASIC METABOLIC PANEL - Abnormal; Notable for the following components:  ?    Result Value  ? Glucose, Bld 114 (*)   ? All other components within normal limits  ?CBC  ?TROPONIN I (HIGH SENSITIVITY)  ? ? ? ?EKG ? ?EKG interpretation performed by myself: NSR, nml axis, nml intervals, no acute ischemic changes ? ? ? ?  RADIOLOGY ? ?I reviewed the CXR which does not show any acute cardiopulmonary process; agree with radiology report  ? ? ?PROCEDURES: ? ?Critical Care performed: No ? ?Procedures ? ? ? ?MEDICATIONS ORDERED IN ED: ?Medications - No data to display ? ? ?IMPRESSION / MDM / ASSESSMENT AND PLAN / ED COURSE  ?I reviewed the triage vital signs and the nursing notes. ?             ?               ? ?Differential diagnosis includes, but is not limited to, anxiety/panic, musculoskeletal, pleurisy, less likely pulmonary embolism, ACS ? ?The patient is a 31 year old female with generalized anxiety disorder and depression frequent panic attacks presents with chest pain.  This is typical of her anxiety related chest pain however it has persisted today despite her anxiety having improved.  Vital signs within normal limits.  She is well-appearing.  The chest pain is nonpleuritic nonexertional low suspicion for ACS.  Her EKG is  nonischemic.  She is PERC negative.  Will obtain chest x-ray.  Overall my suspicion for serious underlying pathology is low.  I suspect that this is related to her underlying anxiety. ? ?CXR w/o acute abnormality.  ? ?  ? ? ?FINAL CLINICAL IMPRESSION(S) / ED DIAGNOSES  ? ?Final diagnoses:  ?Chest pain, unspecified type  ? ? ? ?Rx / DC Orders  ? ?ED Discharge Orders   ? ? None  ? ?  ? ? ? ?Note:  This document was prepared using Dragon voice recognition software and may include unintentional dictation errors. ?  ?Georga Hacking, MD ?04/21/22 2006 ? ?

## 2022-10-10 LAB — GC/CHLAMYDIA PROBE AMP
Chlamydia trachomatis, NAA: NEGATIVE
Neisseria Gonorrhoeae by PCR: NEGATIVE

## 2023-04-23 ENCOUNTER — Ambulatory Visit
Admission: EM | Admit: 2023-04-23 | Discharge: 2023-04-23 | Disposition: A | Discharge status: Home / Self Care | Payer: 59

## 2023-04-23 DIAGNOSIS — L02414 Cutaneous abscess of left upper limb: Secondary | ICD-10-CM

## 2023-04-23 MED ORDER — NAPROXEN SODIUM 550 MG PO TABS: 550.0000 mg | ORAL_TABLET | Freq: Two times a day (BID) | ORAL | 0 refills | Status: AC | PRN

## 2023-04-23 MED ORDER — SULFAMETHOXAZOLE-TRIMETHOPRIM 800-160 MG PO TABS: 1.0000 | ORAL_TABLET | Freq: Two times a day (BID) | ORAL | 0 refills | Status: AC

## 2023-04-23 NOTE — ED Triage Notes (Signed)
Pt states abscess to left underarm for the past 4 days.  States she has been using a warm compress on it.

## 2023-04-23 NOTE — Discharge Instructions (Signed)
Continue to apply warm compresses as needed to reduce pain. Start Bactrim (antibiotics) and complete entire 10 day course.  Anaprox up to twice daily as needed for pain. May take tylenol with Anaprox if needed.

## 2023-04-23 NOTE — ED Provider Notes (Signed)
[facility]    CSN: [id number] Arrival date & time: 04/23/23  0912      History   Chief Complaint Chief Complaint  Patient presents with   Abscess    HPI Vicki Cruz is a 32 y.o. female.   HPI Patient presents for evaluation of abscess under her left armpit x 4 days.  Patient has a recent history of an abscess of the groin which do cellulitis approximately 6 months ago and required extended treatment with Bactrim.  Patient shaves underneath her armpits but does not have recurrent abscesses or boils.  She is afebrile.  Denies any other complaints. Past Medical History:  Diagnosis Date   GAD (generalized anxiety disorder)    History of miscarriage    MDD (major depressive disorder) 08/25/2018   Migraines 05/12/2017   Preterm labor    1st pregnancy had to get medicine at 21 weeks because of labor   Tobacco abuse    quit: 06/06/2015    Patient Active Problem List   Diagnosis Date Noted   Tension headache 10/12/2019   Insomnia 10/12/2019   Diarrhea 10/12/2019   Recurrent cystitis 05/06/2019   MDD (major depressive disorder) 08/25/2018   GAD (generalized anxiety disorder) 05/12/2017   Migraines 05/12/2017   Tobacco abuse 06/11/2015    Past Surgical History:  Procedure Laterality Date   CESAREAN SECTION  2008   CESAREAN SECTION WITH BILATERAL TUBAL LIGATION Bilateral 02/04/2016   Procedure: REPEAT CESAREAN SECTION WITH BILATERAL TUBAL LIGATION;  Surgeon: Hildred Laser, MD;  Location: [facility] ORS;  Service: Obstetrics;  Laterality: Bilateral;   IUD REMOVAL      OB History     Gravida  3   Para  2   Term  2   Preterm      AB  1   Living  2      SAB  1   IAB      Ectopic      Multiple  0   Live Births  2        Obstetric Comments  Failed IOL at 42 weeks          Home Medications    Prior to Admission medications   Medication Sig Start Date End Date Taking? Authorizing Provider  naproxen sodium (ANAPROX DS) 550 MG tablet Take  1 tablet (550 mg total) by mouth 2 (two) times daily as needed for moderate pain or mild pain (take with food). 04/23/23  Yes Bing Neighbors, NP  sulfamethoxazole-trimethoprim (BACTRIM DS) 800-160 MG tablet Take 1 tablet by mouth 2 (two) times daily. 04/23/23  Yes Bing Neighbors, NP  albuterol (VENTOLIN HFA) 108 (90 Base) MCG/ACT inhaler Inhale 2 puffs into the lungs every 6 (six) hours as needed for wheezing or shortness of breath.    [provider]  ALPRAZolam Prudy Feeler) 0.5 MG tablet Take 0.5 mg by mouth at bedtime as needed for anxiety.    [provider]  busPIRone (BUSPAR) 10 MG tablet Take 10 mg by mouth 3 (three) times daily.    [provider]  venlafaxine XR (EFFEXOR-XR) 75 MG 24 hr capsule Take 225 mg by mouth daily.    [provider]    Family History Family History  Problem Relation Age of Onset   Cancer Paternal Grandfather        unsure of what type   Graves' disease Maternal Grandmother     Social History Social History   Tobacco Use  Smoking status: Former    Types: Cigarettes   Smokeless tobacco: Never   Tobacco comments:    occ cigarette  Vaping Use   Vaping Use: Every day  Substance Use Topics   Alcohol use: Not Currently    Comment: ocass   Drug use: No     Allergies   Penicillin g and Penicillins   Review of Systems Review of Systems Pertinent negatives listed in HPI  Physical Exam Triage Vital Signs ED Triage Vitals  Enc Vitals Group     BP 04/23/23 0953 (!) 148/107     Pulse Rate 04/23/23 0953 (!) 110     Resp 04/23/23 0953 16     Temp 04/23/23 0953 98.5 F (36.9 C)     Temp Source 04/23/23 0953 Oral     SpO2 04/23/23 0953 97 %     Weight --      Height --      Head Circumference --      Peak Flow --      Pain Score 04/23/23 0954 8     Pain Loc --      Pain Edu? --      Excl. in GC? --    No data found.  Updated Vital Signs BP (!) 148/107 (BP Location: Right Arm)   Pulse (!) 110   Temp  98.5 F (36.9 C) (Oral)   Resp 16   LMP 04/16/2023 (Approximate)   SpO2 97%   Visual Acuity Right Eye Distance:   Left Eye Distance:   Bilateral Distance:    Right Eye Near:   Left Eye Near:    Bilateral Near:     Physical Exam   UC Treatments / Results  Labs (all labs ordered are listed, but only abnormal results are displayed) Labs Reviewed - No data to display  EKG   Radiology No results found.  Procedures Wound Care  Date/Time: 04/23/2023 10:22 AM  Performed by: Bing Neighbors, NP Authorized by: Bing Neighbors, NP   Consent:    Consent obtained:  Verbal   Consent given by:  Patient   Alternatives discussed:  No treatment Universal protocol:    Patient identity confirmed:  Verbally with patient Anesthesia:    Anesthesia method:  Local infiltration   Local anesthetic:  Lidocaine 1% WITH epi Procedure details:    Indications: skin infection     Wound location:  Arm   Shoulder/arm location:  L upper arm   Wound age (days):  3   Wound surface area (sq cm):  3 Dressing:    Wrapped with:  Elastic bandage 2 inch Post-procedure details:    Procedure completion:  Tolerated Comments:     Attempted to aspirate 3 mm lesion with only sanguinous discharge.  Area is nonfluctuant hardened to touch.  With antibiotics.  Dressing applied.  (including critical care time)  Medications Ordered in UC Medications - No data to display  Initial Impression / Assessment and Plan / UC Course  I have reviewed the triage vital signs and the nursing notes.  Pertinent labs & imaging results that were available during my care of the patient were reviewed by me and considered in my medical decision making (see chart for details).    Abscess of the left arm, attempted to drain this lesion, however was only to extract scant amount of blood.  Cyst was only 3 mm in diameter and non-fluctuant. Treatment with Bactrim double strength twice daily for 10 days.  Anaprox as  needed as  needed for pain.  Continue warm compresses.  If symptoms worsen or do not improve return for evaluation.   Final Clinical Impressions(s) / UC Diagnoses   Final diagnoses:  Abscess of left arm     Discharge Instructions      Continue to apply warm compresses as needed to reduce pain. Start Bactrim (antibiotics) and complete entire 10 day course.  Anaprox up to twice daily as needed for pain. May take tylenol with Anaprox if needed.   ED Prescriptions     Medication Sig Dispense Auth. Provider   sulfamethoxazole-trimethoprim (BACTRIM DS) 800-160 MG tablet Take 1 tablet by mouth 2 (two) times daily. 20 tablet Bing Neighbors, NP   naproxen sodium (ANAPROX DS) 550 MG tablet Take 1 tablet (550 mg total) by mouth 2 (two) times daily as needed for moderate pain or mild pain (take with food). 20 tablet Bing Neighbors, NP      PDMP not reviewed this encounter.   Bing Neighbors, NP 04/23/23 1024

## 2023-04-27 ENCOUNTER — Emergency Department: Payer: 59

## 2023-04-27 ENCOUNTER — Other Ambulatory Visit: Payer: Self-pay

## 2023-04-27 ENCOUNTER — Emergency Department
Admission: EM | Admit: 2023-04-27 | Discharge: 2023-04-27 | Disposition: A | Discharge status: Home / Self Care | Payer: 59 | Attending: Emergency Medicine | Admitting: Emergency Medicine

## 2023-04-27 DIAGNOSIS — S9031XA Contusion of right foot, initial encounter: Secondary | ICD-10-CM | POA: Insufficient documentation

## 2023-04-27 DIAGNOSIS — Y9366 Activity, soccer: Secondary | ICD-10-CM | POA: Insufficient documentation

## 2023-04-27 DIAGNOSIS — M79671 Pain in right foot: Secondary | ICD-10-CM | POA: Diagnosis present

## 2023-04-27 DIAGNOSIS — W2102XA Struck by soccer ball, initial encounter: Secondary | ICD-10-CM | POA: Insufficient documentation

## 2023-04-27 MED ORDER — MELOXICAM 15 MG PO TABS: 15.0000 mg | ORAL_TABLET | Freq: Every day | ORAL | 1 refills | Status: AC

## 2023-04-27 NOTE — Discharge Instructions (Signed)
Take Meloxicam once daily for pain and inflammation.  

## 2023-04-27 NOTE — ED Provider Notes (Signed)
[facility] Provider Note  Patient Contact: 5:47 PM (approximate)   History   Foot Injury   HPI  Vicki Cruz is a 32 y.o. female presents to the emerged [place] department with acute right foot pain after patient was kicking a soccer ball without shoes on.  She has bruising along the dorsal aspect of her right foot.  No numbness or tingling.  No skin compromise.  Patient has had pain with bearing weight.      Physical Exam   Triage Vital Signs: ED Triage Vitals  Enc Vitals Group     BP 04/27/23 1449 (!) 155/86     Pulse Rate 04/27/23 1449 93     Resp 04/27/23 1449 16     Temp 04/27/23 1449 98.9 F (37.2 C)     Temp src --      SpO2 04/27/23 1449 100 %     Weight 04/27/23 1448 150 lb (68 kg)     Height 04/27/23 1448 5\' 4"  (1.626 m)     Head Circumference --      Peak Flow --      Pain Score 04/27/23 1447 8     Pain Loc --      Pain Edu? --      Excl. in GC? --     Most recent vital signs: Vitals:   04/27/23 1449  BP: (!) 155/86  Pulse: 93  Resp: 16  Temp: 98.9 F (37.2 C)  SpO2: 100%     General: Alert and in no acute distress. Eyes:  PERRL. EOMI. Head: No acute traumatic findings ENT:      Nose: No congestion/rhinnorhea.      Mouth/Throat: Mucous membranes are moist. Neck: No stridor. No cervical spine tenderness to palpation. Cardiovascular:  Good peripheral perfusion Respiratory: Normal respiratory effort without tachypnea or retractions. Lungs CTAB. Good air entry to the bases with no decreased or absent breath sounds. Gastrointestinal: Bowel sounds 4 quadrants. Soft and nontender to palpation. No guarding or rigidity. No palpable masses. No distention. No CVA tenderness. Musculoskeletal: Full range of motion to all extremities.  Patient has bruising along the dorsal aspect of her right foot.  Palpable dorsalis pedis pulse, right. Neurologic:  No gross focal neurologic deficits are appreciated.  Skin:   No rash  noted    ED Results / Procedures / Treatments   Labs (all labs ordered are listed, but only abnormal results are displayed) Labs Reviewed - No data to display      RADIOLOGY  I personally viewed and evaluated these images as part of my medical decision making, as well as reviewing the written report by the radiologist.  ED Provider Interpretation: No acute abnormality on x-ray of the right foot.    PROCEDURES:  Critical Care performed: No  Procedures   MEDICATIONS ORDERED IN ED: Medications - No data to display   IMPRESSION / MDM / ASSESSMENT AND PLAN / ED COURSE  I reviewed the triage vital signs and the nursing notes.                              Assessment and plan:  Right foot pain:  32 year old female presents to the emergency department with acute right foot pain after playing soccer without any kind of shoes on.  X-ray shows no acute abnormality.  Suspect soft tissue injury/sprain.  Patient was placed in a cam boot and she was prescribed  meloxicam.  Return precautions were given to return with new or worsening symptoms.   FINAL CLINICAL IMPRESSION(S) / ED DIAGNOSES   Final diagnoses:  Contusion of right foot, initial encounter     Rx / DC Orders   ED Discharge Orders          Ordered    meloxicam (MOBIC) 15 MG tablet  Daily        04/27/23 1743             Note:  This document was prepared using Dragon voice recognition software and may include unintentional dictation errors.   Pia Mau [place], PA-C 04/27/23 1751    Chesley Noon, MD 04/27/23 2010

## 2023-04-27 NOTE — ED Triage Notes (Signed)
Pt to ED for right foot injury after kicking boyfriend in leg yesterday while playing soccer. Noted to have altered gait.

## 2023-06-23 ENCOUNTER — Emergency Department: Payer: 59

## 2023-06-23 ENCOUNTER — Other Ambulatory Visit: Payer: Self-pay

## 2023-06-23 ENCOUNTER — Encounter: Payer: Self-pay | Admitting: Emergency Medicine

## 2023-06-23 ENCOUNTER — Emergency Department
Admission: EM | Admit: 2023-06-23 | Discharge: 2023-06-23 | Disposition: A | Discharge status: Home / Self Care | Payer: 59 | Attending: Emergency Medicine | Admitting: Emergency Medicine

## 2023-06-23 DIAGNOSIS — N390 Urinary tract infection, site not specified: Secondary | ICD-10-CM | POA: Diagnosis not present

## 2023-06-23 DIAGNOSIS — R109 Unspecified abdominal pain: Secondary | ICD-10-CM

## 2023-06-23 DIAGNOSIS — R1030 Lower abdominal pain, unspecified: Secondary | ICD-10-CM | POA: Diagnosis present

## 2023-06-23 LAB — URINALYSIS, ROUTINE W REFLEX MICROSCOPIC
Bilirubin Urine: NEGATIVE
Glucose, UA: NEGATIVE mg/dL
Hgb urine dipstick: NEGATIVE
Ketones, ur: NEGATIVE mg/dL
Leukocytes,Ua: NEGATIVE
Nitrite: POSITIVE — AB
Protein, ur: 30 mg/dL — AB
Specific Gravity, Urine: 1.012 (ref 1.005–1.030)
WBC, UA: >50 WBC/hpf (ref 0–5)
pH: 5 (ref 5.0–8.0)

## 2023-06-23 LAB — CBC
HCT: 41.9 % (ref 36.0–46.0)
Hemoglobin: 13.5 g/dL (ref 12.0–15.0)
MCH: 26.9 pg (ref 26.0–34.0)
MCHC: 32.2 g/dL (ref 30.0–36.0)
MCV: 83.5 fL (ref 80.0–100.0)
Platelets: 321 10*3/uL (ref 150–400)
RBC: 5.02 MIL/uL (ref 3.87–5.11)
RDW: 15.3 % (ref 11.5–15.5)
WBC: 11.7 10*3/uL — ABNORMAL HIGH (ref 4.0–10.5)
nRBC: 0 % (ref 0.0–0.2)

## 2023-06-23 LAB — BASIC METABOLIC PANEL
Anion gap: 11 (ref 5–15)
BUN: 18 mg/dL (ref 6–20)
CO2: 22 mmol/L (ref 22–32)
Calcium: 9.1 mg/dL (ref 8.9–10.3)
Chloride: 104 mmol/L (ref 98–111)
Creatinine, Ser: 0.88 mg/dL (ref 0.44–1.00)
GFR, Estimated: >60 mL/min (ref >60)
Glucose, Bld: 111 mg/dL — ABNORMAL HIGH (ref 70–99)
Potassium: 3.8 mmol/L (ref 3.5–5.1)
Sodium: 137 mmol/L (ref 135–145)

## 2023-06-23 LAB — POC URINE PREG, ED: Preg Test, Ur: NEGATIVE

## 2023-06-23 MED ORDER — ONDANSETRON HCL 4 MG/2ML IJ SOLN
4.0000 mg | Freq: Once | INTRAMUSCULAR | Status: AC
  Administered 2023-06-23: 4 mg via INTRAVENOUS
  Filled 2023-06-23: qty 2

## 2023-06-23 MED ORDER — MORPHINE SULFATE (PF) 4 MG/ML IV SOLN
4.0000 mg | Freq: Once | INTRAVENOUS | Status: AC
  Administered 2023-06-23: 4 mg via INTRAVENOUS
  Filled 2023-06-23: qty 1

## 2023-06-23 MED ORDER — ONDANSETRON 4 MG PO TBDP: 4.0000 mg | ORAL_TABLET | Freq: Three times a day (TID) | ORAL | 0 refills | Status: DC | PRN

## 2023-06-23 MED ORDER — SODIUM CHLORIDE 0.9 % IV SOLN
1.0000 g | Freq: Once | INTRAVENOUS | Status: AC
  Administered 2023-06-23: 1 g via INTRAVENOUS
  Filled 2023-06-23: qty 10

## 2023-06-23 MED ORDER — SODIUM CHLORIDE 0.9 % IV BOLUS
1000.0000 mL | Freq: Once | INTRAVENOUS | Status: AC
  Administered 2023-06-23: 1000 mL via INTRAVENOUS

## 2023-06-23 MED ORDER — OXYCODONE-ACETAMINOPHEN 5-325 MG PO TABS: 1.0000 | ORAL_TABLET | ORAL | 0 refills | Status: AC | PRN

## 2023-06-23 MED ORDER — CEFDINIR 300 MG PO CAPS: 300.0000 mg | ORAL_CAPSULE | Freq: Two times a day (BID) | ORAL | 0 refills | Status: AC

## 2023-06-23 MED ORDER — KETOROLAC TROMETHAMINE 30 MG/ML IJ SOLN
15.0000 mg | Freq: Once | INTRAMUSCULAR | Status: AC
  Administered 2023-06-23: 15 mg via INTRAVENOUS
  Filled 2023-06-23: qty 1

## 2023-06-23 MED ORDER — ONDANSETRON 4 MG PO TBDP
4.0000 mg | ORAL_TABLET | Freq: Once | ORAL | Status: AC
  Administered 2023-06-23: 4 mg via ORAL
  Filled 2023-06-23: qty 1

## 2023-06-23 MED ORDER — KETOROLAC TROMETHAMINE 10 MG PO TABS: 10.0000 mg | ORAL_TABLET | Freq: Four times a day (QID) | ORAL | 0 refills | Status: AC | PRN

## 2023-06-23 NOTE — Discharge Instructions (Signed)
Follow-up with your regular doctor as needed.  Follow-up with [facility] urological.  Please call for an appointment.  Return the emergency department if worsening.  Take your medications as prescribed.

## 2023-06-23 NOTE — ED Notes (Signed)
See triage notes. Patient stated she believes she has a UTI. Stated that today her back is hurting as well.

## 2023-06-23 NOTE — ED Provider Notes (Signed)
[facility] Provider Note    Event Date/Time   First MD Initiated Contact with Patient 06/23/23 1231     (approximate)   History   UTI   HPI  Vicki Cruz is a 32 y.o. female with history of multiple UTIs, pyelonephritis, presents emergency department complaining of UTI started about 3 days ago.  Started taking Bactrim that she had leftover.  Had 2 doses yesterday and 1 today.  Has continued to have worsening lower abdominal and back pain up around her kidneys.  Patient states that it feels similar to when she has had pyelonephritis.  Some nausea and vomiting.  Some fever.  Patient has required admission for pyelonephritis prior to today      Physical Exam   Triage Vital Signs: ED Triage Vitals  Enc Vitals Group     BP 06/23/23 1028 (!) 171/106     Pulse Rate 06/23/23 1028 (!) 120     Resp 06/23/23 1028 19     Temp 06/23/23 1028 99.2 F (37.3 C)     Temp src --      SpO2 06/23/23 1028 96 %     Weight 06/23/23 1240 149 lb 14.6 oz (68 kg)     Height 06/23/23 1240 5\' 4"  (1.626 m)     Head Circumference --      Peak Flow --      Pain Score 06/23/23 1027 10     Pain Loc --      Pain Edu? --      Excl. in GC? --     Most recent vital signs: Vitals:   06/23/23 1356 06/23/23 1439  BP: (!) 150/97 (!) 148/88  Pulse: 90 88  Resp: 18 18  Temp:  98.9 F (37.2 C)  SpO2: 96% 96%     General: Awake, no distress.   CV:  Good peripheral perfusion. regular rate and  rhythm Resp:  Normal effort. Lungs cta Abd:  No distention.   Other:  Tender in the upper quadrants bilaterally   ED Results / Procedures / Treatments   Labs (all labs ordered are listed, but only abnormal results are displayed) Labs Reviewed  URINALYSIS, ROUTINE W REFLEX MICROSCOPIC - Abnormal; Notable for the following components:      Result Value   Color, Urine AMBER (*)    APPearance HAZY (*)    Protein, ur 30 (*)    Nitrite POSITIVE (*)    Bacteria, UA RARE (*)     All other components within normal limits  CBC - Abnormal; Notable for the following components:   WBC 11.7 (*)    All other components within normal limits  BASIC METABOLIC PANEL - Abnormal; Notable for the following components:   Glucose, Bld 111 (*)    All other components within normal limits  POC URINE PREG, ED     EKG     RADIOLOGY CT renal stone    PROCEDURES:   Procedures   MEDICATIONS ORDERED IN ED: Medications  ondansetron (ZOFRAN-ODT) disintegrating tablet 4 mg (4 mg Oral Given 06/23/23 1034)  sodium chloride 0.9 % bolus 1,000 mL (0 mLs Intravenous Stopped 06/23/23 1522)  morphine (PF) 4 MG/ML injection 4 mg (4 mg Intravenous Given 06/23/23 1323)  ondansetron (ZOFRAN) injection 4 mg (4 mg Intravenous Given 06/23/23 1323)  morphine (PF) 4 MG/ML injection 4 mg (4 mg Intravenous Given 06/23/23 1400)  cefTRIAXone (ROCEPHIN) 1 g in sodium chloride 0.9 % 100 mL IVPB (0 g  Intravenous Stopped 06/23/23 1522)  ketorolac (TORADOL) 30 MG/ML injection 15 mg (15 mg Intravenous Given 06/23/23 1416)     IMPRESSION / MDM / ASSESSMENT AND PLAN / ED COURSE  I reviewed the triage vital signs and the nursing notes.                              Differential diagnosis includes, but is not limited to, UTI, pyelonephritis, bowel obstruction, appendicitis  Patient's presentation is most consistent with acute presentation with potential threat to life or bodily function.   Patient is tachycardic and her WBC is elevated 11.7, urinalysis shows infection with nitrites and rare bacteria, WBC clumps present  poc pregnancy is reassuring, basic metabolic panel is reassuring  CT renal stone study ordered, patient was given 1 L normal saline IV, morphine 4 mg IV, Zofran 4 mg IV   CT renal stone study independently reviewed interpreted by me as being negative for any acute abnormality.  Confirmed by radiology  Since his CT is negative.  Patient has also been on antibiotics for couple of days we will  go ahead and give her dose of Rocephin while here in the ED.  Toradol to see if it helps with the pain.  If the Toradol helps and she tolerates Rocephin well will discharge her on Omnicef along with Toradol and pain medication.  Strict instructions for her to follow-up with urology.  Strict instructions to return emergency department worsening.     FINAL CLINICAL IMPRESSION(S) / ED DIAGNOSES   Final diagnoses:  Acute UTI  Flank pain     Rx / DC Orders   ED Discharge Orders          Ordered    cefdinir (OMNICEF) 300 MG capsule  2 times daily        06/23/23 1442    ketorolac (TORADOL) 10 MG tablet  Every 6 hours PRN        06/23/23 1442    ondansetron (ZOFRAN-ODT) 4 MG disintegrating tablet  Every 8 hours PRN        06/23/23 1442    oxyCODONE-acetaminophen (PERCOCET) 5-325 MG tablet  Every 4 hours PRN        06/23/23 1442             Note:  This document was prepared using Dragon voice recognition software and may include unintentional dictation errors.    Faythe Ghee, PA-C 06/23/23 1546    Chesley Noon, MD 06/24/23 [phone]

## 2023-06-23 NOTE — ED Triage Notes (Signed)
Pt comes with c/o UTI that started about 3 days ago.  Pt states symptoms started few days ago. Pt states lower back pain bilateral. Pt states pain and burning with urination.

## 2024-01-01 IMAGING — CR DG CHEST 2V
2 series · 2 of 2 positions shown · non-contrast
Comparison: 11/05/2021

CLINICAL DATA: Chest pain

EXAM:
CHEST - 2 VIEW

[chest pa]
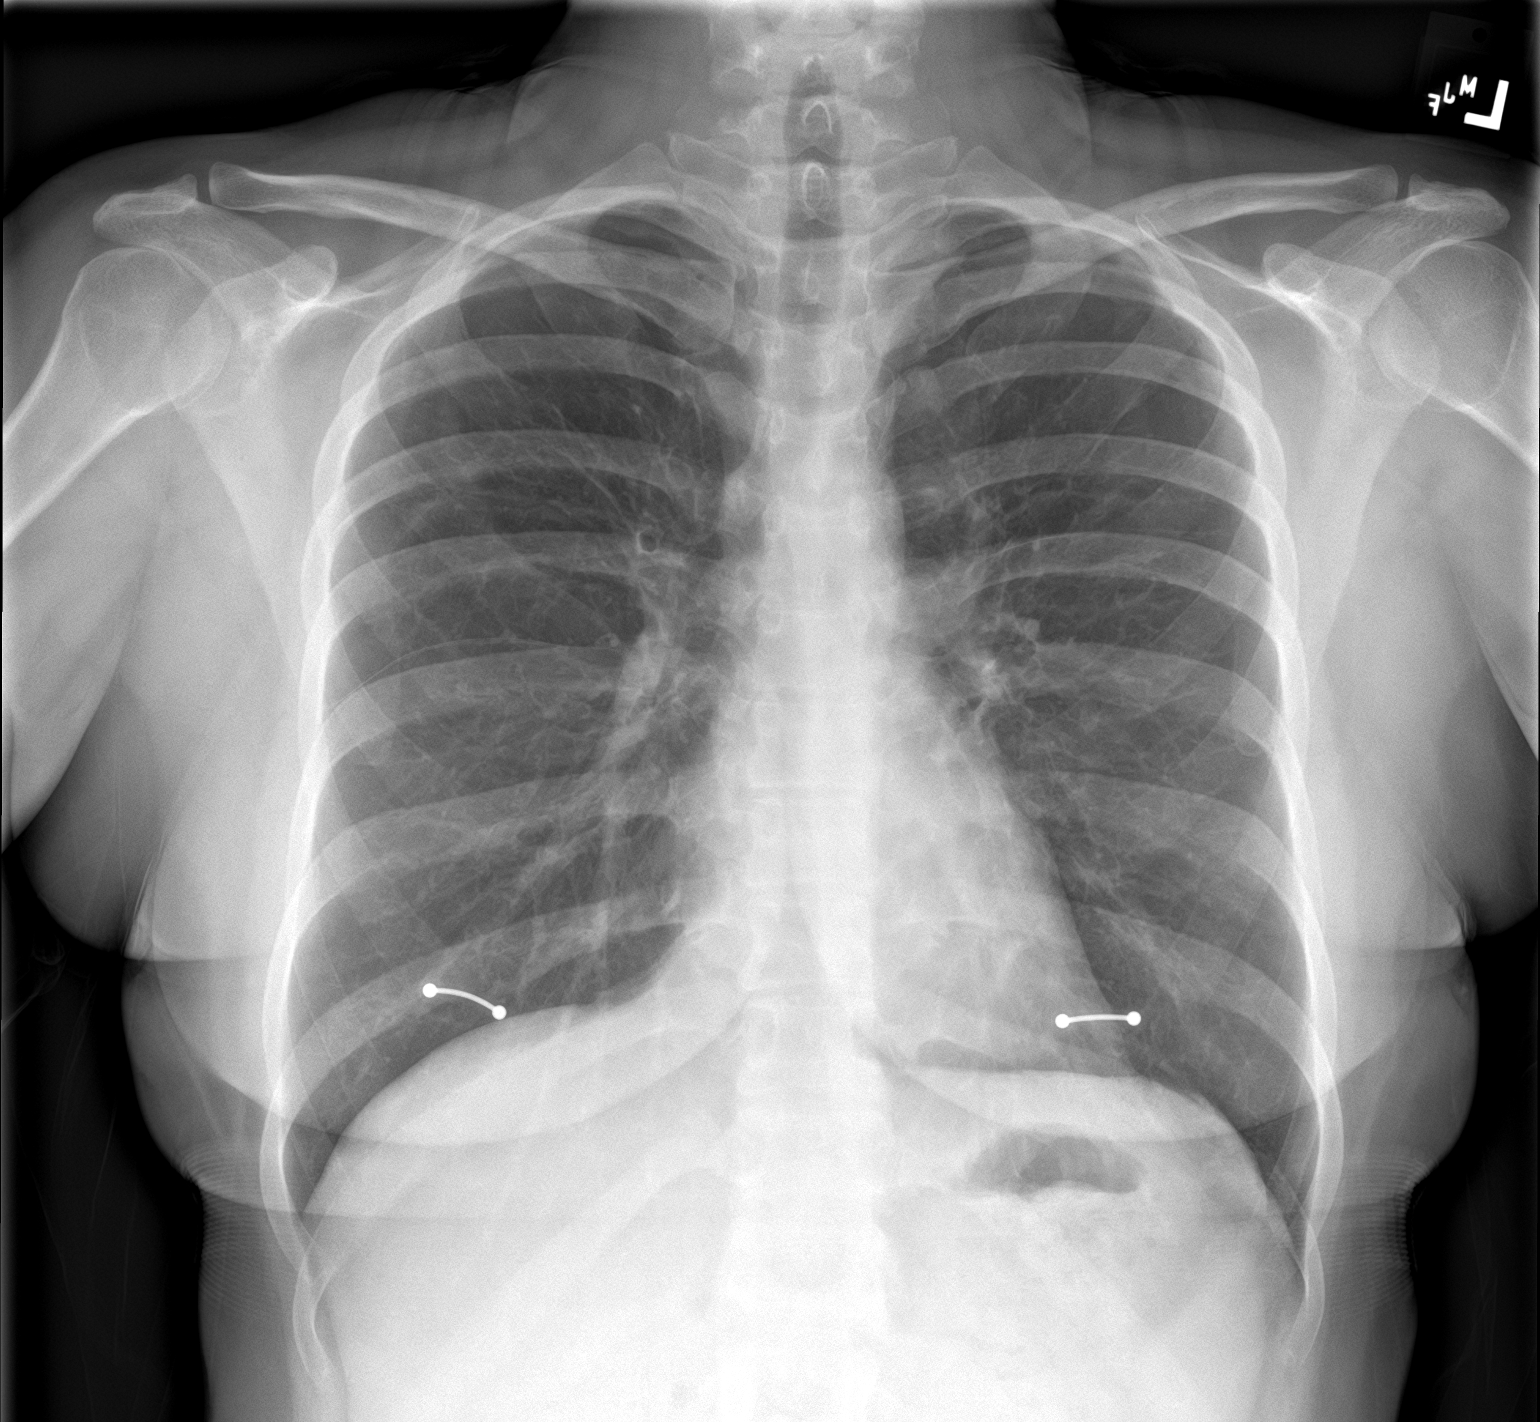

[chest lat]
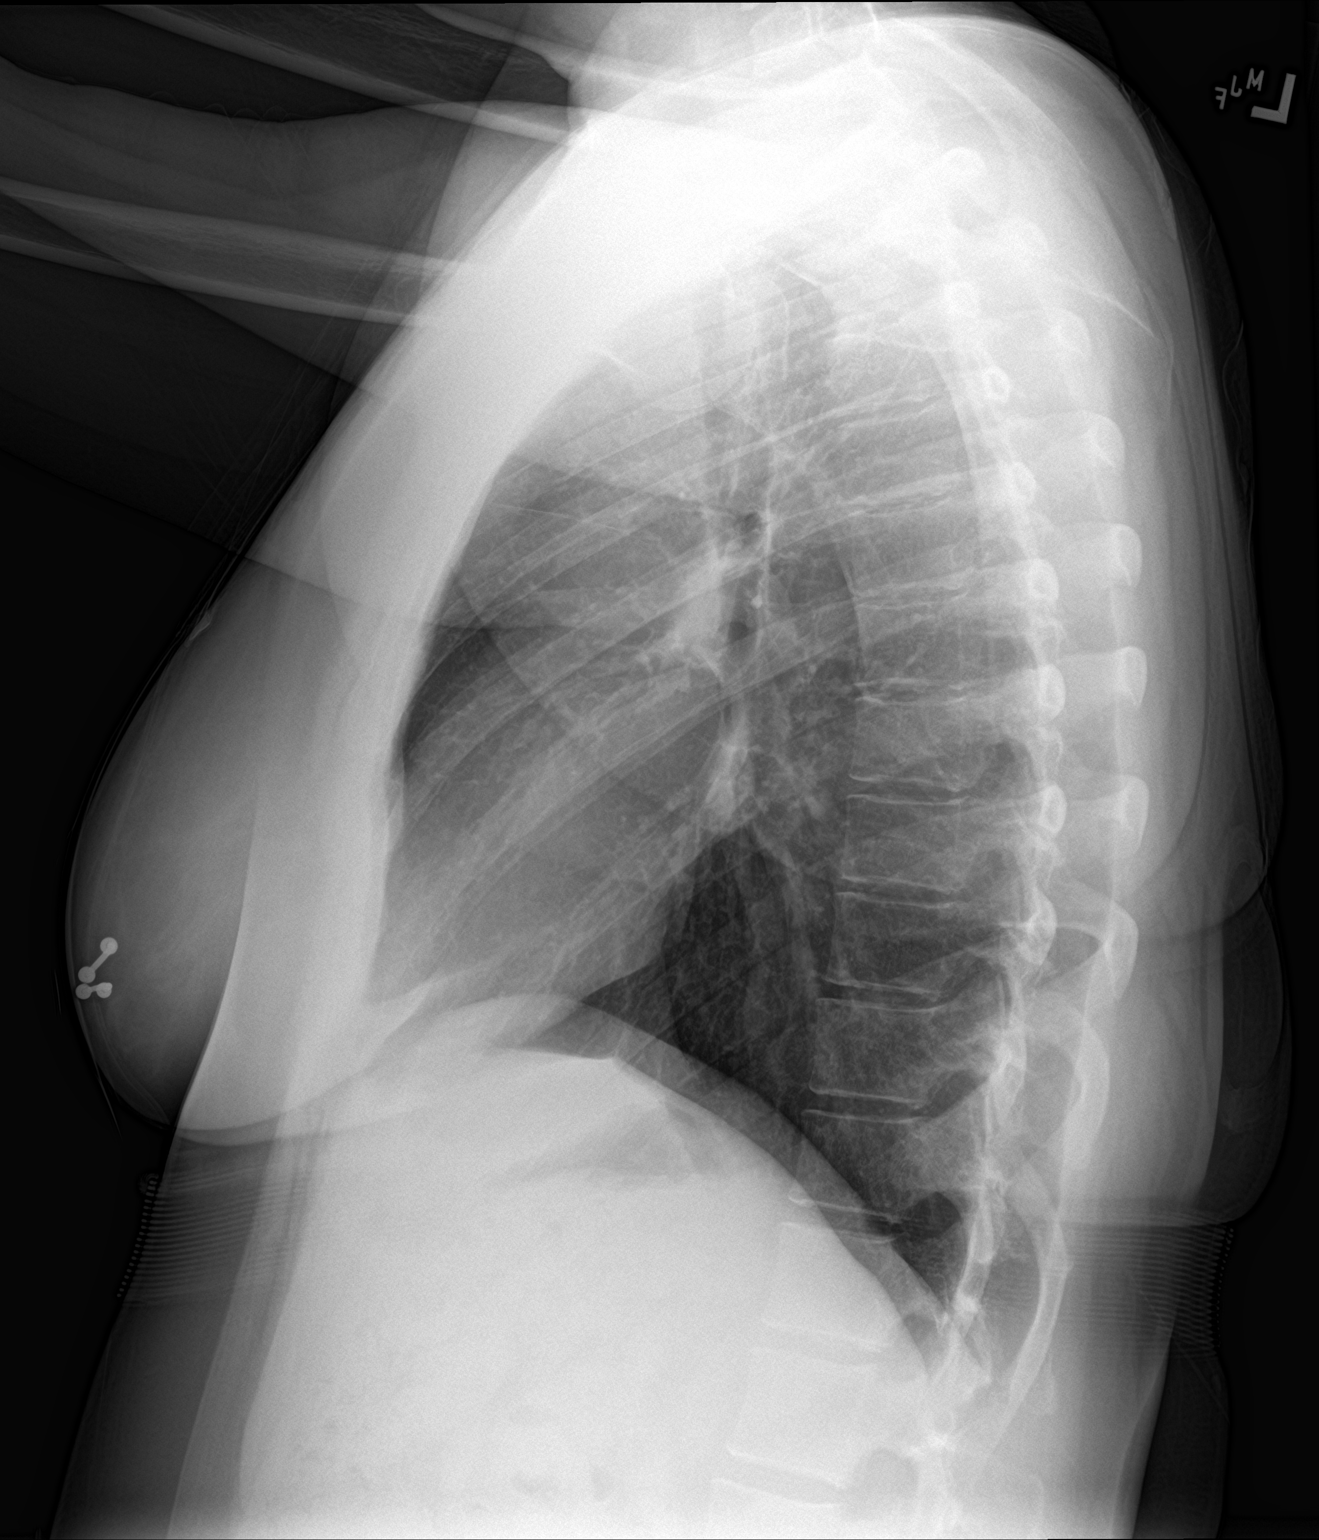

[2 of 2 positions shown; findings below may reference images not displayed]

FINDINGS: The heart size and mediastinal contours are within normal limits.
Both lungs are clear. The visualized skeletal structures are
unremarkable.
IMPRESSION: Negative.

## 2024-05-04 ENCOUNTER — Emergency Department (HOSPITAL_COMMUNITY)
Admission: EM | Admit: 2024-05-04 | Discharge: 2024-05-04 | Disposition: A | Discharge status: Home / Self Care | Attending: Emergency Medicine | Admitting: Emergency Medicine

## 2024-05-04 ENCOUNTER — Other Ambulatory Visit: Payer: Self-pay

## 2024-05-04 ENCOUNTER — Emergency Department (HOSPITAL_COMMUNITY)

## 2024-05-04 ENCOUNTER — Encounter (HOSPITAL_COMMUNITY): Payer: Self-pay

## 2024-05-04 DIAGNOSIS — M549 Dorsalgia, unspecified: Secondary | ICD-10-CM | POA: Insufficient documentation

## 2024-05-04 DIAGNOSIS — M25512 Pain in left shoulder: Secondary | ICD-10-CM | POA: Insufficient documentation

## 2024-05-04 DIAGNOSIS — M542 Cervicalgia: Secondary | ICD-10-CM | POA: Diagnosis not present

## 2024-05-04 DIAGNOSIS — S299XXA Unspecified injury of thorax, initial encounter: Secondary | ICD-10-CM | POA: Diagnosis present

## 2024-05-04 DIAGNOSIS — Y92410 Unspecified street and highway as the place of occurrence of the external cause: Secondary | ICD-10-CM | POA: Diagnosis not present

## 2024-05-04 DIAGNOSIS — S298XXA Other specified injuries of thorax, initial encounter: Secondary | ICD-10-CM

## 2024-05-04 LAB — BASIC METABOLIC PANEL WITH GFR
Anion gap: 8 (ref 5–15)
BUN: 17 mg/dL (ref 6–20)
CO2: 20 mmol/L — ABNORMAL LOW (ref 22–32)
Calcium: 8.8 mg/dL — ABNORMAL LOW (ref 8.9–10.3)
Chloride: 110 mmol/L (ref 98–111)
Creatinine, Ser: 0.77 mg/dL (ref 0.44–1.00)
GFR, Estimated: >60 mL/min (ref >60)
Glucose, Bld: 100 mg/dL — ABNORMAL HIGH (ref 70–99)
Potassium: 4.1 mmol/L (ref 3.5–5.1)
Sodium: 138 mmol/L (ref 135–145)

## 2024-05-04 LAB — HCG, SERUM, QUALITATIVE: Preg, Serum: NEGATIVE

## 2024-05-04 LAB — CBC
HCT: 34.9 % — ABNORMAL LOW (ref 36.0–46.0)
Hemoglobin: 11.3 g/dL — ABNORMAL LOW (ref 12.0–15.0)
MCH: 25.5 pg — ABNORMAL LOW (ref 26.0–34.0)
MCHC: 32.4 g/dL (ref 30.0–36.0)
MCV: 78.8 fL — ABNORMAL LOW (ref 80.0–100.0)
Platelets: 188 10*3/uL (ref 150–400)
RBC: 4.43 MIL/uL (ref 3.87–5.11)
RDW: 16.2 % — ABNORMAL HIGH (ref 11.5–15.5)
WBC: 9 10*3/uL (ref 4.0–10.5)
nRBC: 0 % (ref 0.0–0.2)

## 2024-05-04 MED ORDER — HYDROMORPHONE HCL 1 MG/ML IJ SOLN
0.5000 mg | Freq: Once | INTRAMUSCULAR | Status: AC
  Administered 2024-05-04: 0.5 mg via INTRAVENOUS
  Filled 2024-05-04: qty 1

## 2024-05-04 MED ORDER — FENTANYL CITRATE PF 50 MCG/ML IJ SOSY
50.0000 ug | PREFILLED_SYRINGE | Freq: Once | INTRAMUSCULAR | Status: AC
  Administered 2024-05-04: 50 ug via INTRAVENOUS
  Filled 2024-05-04: qty 1

## 2024-05-04 MED ORDER — KETOROLAC TROMETHAMINE 15 MG/ML IJ SOLN
15.0000 mg | Freq: Once | INTRAMUSCULAR | Status: AC
  Administered 2024-05-04: 15 mg via INTRAVENOUS
  Filled 2024-05-04: qty 1

## 2024-05-04 MED ORDER — METHOCARBAMOL 500 MG PO TABS: 500.0000 mg | ORAL_TABLET | Freq: Three times a day (TID) | ORAL | 0 refills | Status: DC | PRN

## 2024-05-04 MED ORDER — METHOCARBAMOL 500 MG PO TABS: 500.0000 mg | ORAL_TABLET | Freq: Three times a day (TID) | ORAL | 0 refills | Status: AC | PRN

## 2024-05-04 MED ORDER — IOHEXOL 350 MG/ML SOLN
75.0000 mL | Freq: Once | INTRAVENOUS | Status: AC | PRN
  Administered 2024-05-04: 75 mL via INTRAVENOUS

## 2024-05-04 NOTE — Discharge Instructions (Addendum)
 There was a nodule in your chest.  Just over a centimeter.  Follow-up with your primary care doctor for it.
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
## 2024-10-25 ENCOUNTER — Ambulatory Visit: Payer: Self-pay

## 2024-10-26 ENCOUNTER — Emergency Department (HOSPITAL_BASED_OUTPATIENT_CLINIC_OR_DEPARTMENT_OTHER)
Admission: EM | Admit: 2024-10-26 | Discharge: 2024-10-26 | Disposition: A | Discharge status: Home / Self Care | Payer: Self-pay

## 2024-10-26 ENCOUNTER — Encounter (HOSPITAL_BASED_OUTPATIENT_CLINIC_OR_DEPARTMENT_OTHER): Payer: Self-pay

## 2024-10-26 ENCOUNTER — Emergency Department (HOSPITAL_BASED_OUTPATIENT_CLINIC_OR_DEPARTMENT_OTHER): Payer: Self-pay

## 2024-10-26 ENCOUNTER — Other Ambulatory Visit: Payer: Self-pay

## 2024-10-26 DIAGNOSIS — D72829 Elevated white blood cell count, unspecified: Secondary | ICD-10-CM | POA: Insufficient documentation

## 2024-10-26 DIAGNOSIS — N12 Tubulo-interstitial nephritis, not specified as acute or chronic: Secondary | ICD-10-CM | POA: Insufficient documentation

## 2024-10-26 LAB — LACTIC ACID, PLASMA: Lactic Acid, Venous: 1.8 mmol/L (ref 0.5–1.9)

## 2024-10-26 LAB — COMPREHENSIVE METABOLIC PANEL WITH GFR
ALT: 42 U/L (ref 0–44)
AST: 28 U/L (ref 15–41)
Albumin: 4.9 g/dL (ref 3.5–5.0)
Alkaline Phosphatase: 91 U/L (ref 38–126)
Anion gap: 13 (ref 5–15)
BUN: 20 mg/dL (ref 6–20)
CO2: 23 mmol/L (ref 22–32)
Calcium: 9.3 mg/dL (ref 8.9–10.3)
Chloride: 105 mmol/L (ref 98–111)
Creatinine, Ser: 0.79 mg/dL (ref 0.44–1.00)
GFR, Estimated: >60 mL/min (ref >60)
Glucose, Bld: 108 mg/dL — ABNORMAL HIGH (ref 70–99)
Potassium: 4.3 mmol/L (ref 3.5–5.1)
Sodium: 141 mmol/L (ref 135–145)
Total Bilirubin: 0.3 mg/dL (ref 0.0–1.2)
Total Protein: 7.5 g/dL (ref 6.5–8.1)

## 2024-10-26 LAB — CBC WITH DIFFERENTIAL/PLATELET
Abs Immature Granulocytes: 0.03 K/uL (ref 0.00–0.07)
Basophils Absolute: 0.1 K/uL (ref 0.0–0.1)
Basophils Relative: 1 %
Eosinophils Absolute: 0.1 K/uL (ref 0.0–0.5)
Eosinophils Relative: 1 %
HCT: 40.5 % (ref 36.0–46.0)
Hemoglobin: 13.3 g/dL (ref 12.0–15.0)
Immature Granulocytes: 0 %
Lymphocytes Relative: 36 %
Lymphs Abs: 4.4 K/uL — ABNORMAL HIGH (ref 0.7–4.0)
MCH: 26.1 pg (ref 26.0–34.0)
MCHC: 32.8 g/dL (ref 30.0–36.0)
MCV: 79.6 fL — ABNORMAL LOW (ref 80.0–100.0)
Monocytes Absolute: 1 K/uL (ref 0.1–1.0)
Monocytes Relative: 8 %
Neutro Abs: 6.5 K/uL (ref 1.7–7.7)
Neutrophils Relative %: 54 %
Platelets: 292 K/uL (ref 150–400)
RBC: 5.09 MIL/uL (ref 3.87–5.11)
RDW: 16 % — ABNORMAL HIGH (ref 11.5–15.5)
WBC: 12.1 K/uL — ABNORMAL HIGH (ref 4.0–10.5)
nRBC: 0 % (ref 0.0–0.2)

## 2024-10-26 LAB — URINALYSIS, W/ REFLEX TO CULTURE (INFECTION SUSPECTED)

## 2024-10-26 LAB — PREGNANCY, URINE: Preg Test, Ur: NEGATIVE

## 2024-10-26 MED ORDER — CIPROFLOXACIN HCL 500 MG PO TABS
500.0000 mg | ORAL_TABLET | Freq: Two times a day (BID) | ORAL | 0 refills | Status: AC
  Filled 2024-10-26: qty 14, 7d supply, fill #0

## 2024-10-26 MED ORDER — ONDANSETRON 4 MG PO TBDP
4.0000 mg | ORAL_TABLET | Freq: Three times a day (TID) | ORAL | 0 refills | Status: AC | PRN
  Filled 2024-10-26: qty 20, 7d supply, fill #0

## 2024-10-26 MED ORDER — OXYCODONE-ACETAMINOPHEN 5-325 MG PO TABS
1.0000 | ORAL_TABLET | Freq: Once | ORAL | Status: AC
  Administered 2024-10-26: 1 via ORAL
  Filled 2024-10-26: qty 1

## 2024-10-26 MED ORDER — PROMETHAZINE HCL 25 MG RE SUPP
25.0000 mg | Freq: Four times a day (QID) | RECTAL | 0 refills | Status: AC | PRN
  Filled 2024-10-26: qty 3, 1d supply, fill #0

## 2024-10-26 MED ORDER — MORPHINE SULFATE (PF) 2 MG/ML IV SOLN
2.0000 mg | Freq: Once | INTRAVENOUS | Status: AC | PRN
  Administered 2024-10-26: 2 mg via INTRAVENOUS
  Filled 2024-10-26: qty 1

## 2024-10-26 MED ORDER — LACTATED RINGERS IV BOLUS
1000.0000 mL | Freq: Once | INTRAVENOUS | Status: AC
  Administered 2024-10-26: 1000 mL via INTRAVENOUS

## 2024-10-26 MED ORDER — SODIUM CHLORIDE 0.9 % IV SOLN
1.0000 g | Freq: Once | INTRAVENOUS | Status: AC
  Administered 2024-10-26: 1 g via INTRAVENOUS
  Filled 2024-10-26: qty 10

## 2024-10-26 MED ORDER — ONDANSETRON 4 MG PO TBDP
4.0000 mg | ORAL_TABLET | Freq: Once | ORAL | Status: AC
  Administered 2024-10-26: 4 mg via ORAL
  Filled 2024-10-26: qty 1

## 2024-10-26 MED ORDER — IOHEXOL 300 MG/ML  SOLN
80.0000 mL | Freq: Once | INTRAMUSCULAR | Status: AC | PRN
  Administered 2024-10-26: 80 mL via INTRAVENOUS

## 2024-10-26 MED ORDER — ONDANSETRON HCL 4 MG/2ML IJ SOLN
4.0000 mg | Freq: Once | INTRAMUSCULAR | Status: AC
  Administered 2024-10-26: 4 mg via INTRAVENOUS
  Filled 2024-10-26: qty 2

## 2024-10-26 MED ORDER — OXYCODONE-ACETAMINOPHEN 5-325 MG PO TABS
1.0000 | ORAL_TABLET | Freq: Four times a day (QID) | ORAL | 0 refills | Status: AC | PRN
  Filled 2024-10-26: qty 12, 3d supply, fill #0

## 2024-10-26 MED ORDER — KETOROLAC TROMETHAMINE 15 MG/ML IJ SOLN
15.0000 mg | Freq: Once | INTRAMUSCULAR | Status: AC
  Administered 2024-10-26: 15 mg via INTRAVENOUS
  Filled 2024-10-26: qty 1

## 2024-10-26 NOTE — ED Notes (Signed)
 UTI 2 wks ago and back pain nausea and vomiting started this morning.  Pt. Urinating and feels frequency.  Pt. Reports never had a kidney stone but yes to kidney infections.  Back pain and side pain bilat.

## 2024-10-26 NOTE — ED Triage Notes (Signed)
 Reports dysuria, increased urgency for 2 weeks. R flank pain, N/V since this morning

## 2024-10-26 NOTE — Discharge Instructions (Signed)
 We are prescribing antibiotics.  Please take them as prescribed for the full course even if symptoms improve.  Return immediately for fevers, severe pain, uncontrolled nausea vomiting, stop making urine or you develop any new or worsening symptoms that are concerning to you.

## 2024-10-26 NOTE — ED Provider Notes (Signed)
 [place] [facility] Provider Note   CSN: [id number] Arrival date & time: 10/26/24  1649     Patient presents with: Flank Pain   Vicki Cruz is a 33 y.o. female.   Is a 33 year old female presenting emergency department with nausea vomiting flank pain in the setting of dysuria and frequency.  Reports that she has had symptoms of urinary tract infection for the past 2 weeks, but has not sought medical care.  Today started with nausea vomiting, nonbloody nonbilious emesis.  Having bilateral flank pain.  Had normal bowel movement this morning.  Reports history of kidney infections.  Denies prior abdominal surgeries.   Flank Pain       Prior to Admission medications   Medication Sig Start Date End Date Taking? Authorizing Provider  ciprofloxacin  (CIPRO ) 500 MG tablet Take 1 tablet (500 mg total) by mouth every 12 (twelve) hours for 7 days. 10/26/24 11/02/24 Yes Samad Thon, Caron PARAS, DO  ondansetron  (ZOFRAN -ODT) 4 MG disintegrating tablet Take 1 tablet (4 mg total) by mouth every 8 (eight) hours as needed for nausea or vomiting. 10/26/24  Yes Neysa Caron PARAS, DO  oxyCODONE -acetaminophen  (PERCOCET/ROXICET) 5-325 MG tablet Take 1 tablet by mouth every 6 (six) hours as needed for severe pain (pain score 7-10). 10/26/24  Yes Neysa Caron PARAS, DO  promethazine  (PHENERGAN ) 25 MG suppository Place 1 suppository (25 mg total) rectally every 6 (six) hours as needed for nausea or vomiting. 10/26/24  Yes Neysa Caron PARAS, DO  albuterol (VENTOLIN HFA) 108 (90 Base) MCG/ACT inhaler Inhale 2 puffs into the lungs every 6 (six) hours as needed for wheezing or shortness of breath.    [provider]  ALPRAZolam  (XANAX ) 0.5 MG tablet Take 0.5 mg by mouth at bedtime as needed for anxiety.    [provider]  busPIRone  (BUSPAR ) 10 MG tablet Take 10 mg by mouth 3 (three) times daily.    [provider]  cefdinir  (OMNICEF ) 300 MG capsule Take 1  capsule (300 mg total) by mouth 2 (two) times daily. 06/23/23   Fisher, Devere ORN, PA-C  ketorolac  (TORADOL ) 10 MG tablet Take 1 tablet (10 mg total) by mouth every 6 (six) hours as needed. 06/23/23   Fisher, Devere ORN, PA-C  methocarbamol  (ROBAXIN ) 500 MG tablet Take 1 tablet (500 mg total) by mouth every 8 (eight) hours as needed. 05/04/24   Patsey Lot, MD  naproxen  sodium (ANAPROX  DS) 550 MG tablet Take 1 tablet (550 mg total) by mouth 2 (two) times daily as needed for moderate pain or mild pain (take with food). 04/23/23   Arloa Suzen RAMAN, NP  sulfamethoxazole -trimethoprim  (BACTRIM  DS) 800-160 MG tablet Take 1 tablet by mouth 2 (two) times daily. 04/23/23   Arloa Suzen RAMAN, NP  venlafaxine  XR (EFFEXOR -XR) 75 MG 24 hr capsule Take 225 mg by mouth daily.    [provider]    Allergies: Penicillin g and Penicillins    Review of Systems  Genitourinary:  Positive for flank pain.    Updated Vital Signs BP 110/62   Pulse 73   Temp 98.2 F (36.8 C)   Resp 18   LMP 10/21/2024 (Approximate)   SpO2 100%   Physical Exam Vitals and nursing note reviewed.  Constitutional:      Appearance: She is not toxic-appearing.     Comments: Uncomfortable appearing  HENT:     Nose: Nose normal.     Mouth/Throat:     Mouth: Mucous membranes  are moist.  Eyes:     Conjunctiva/sclera: Conjunctivae normal.  Cardiovascular:     Rate and Rhythm: Tachycardia present.     Pulses: Normal pulses.     Heart sounds: Normal heart sounds.  Pulmonary:     Effort: Pulmonary effort is normal.     Breath sounds: Normal breath sounds.  Abdominal:     General: Abdomen is flat. There is no distension.     Tenderness: There is no abdominal tenderness. There is no right CVA tenderness, left CVA tenderness, guarding or rebound.  Skin:    General: Skin is warm and dry.     Capillary Refill: Capillary refill takes less than 2 seconds.  Neurological:     Mental Status: She is oriented to person, place, and  time.  Psychiatric:        Mood and Affect: Mood normal.        Behavior: Behavior normal.     (all labs ordered are listed, but only abnormal results are displayed) Labs Reviewed  COMPREHENSIVE METABOLIC PANEL WITH GFR - Abnormal; Notable for the following components:      Result Value   Glucose, Bld 108 (*)    All other components within normal limits  CBC WITH DIFFERENTIAL/PLATELET - Abnormal; Notable for the following components:   WBC 12.1 (*)    MCV 79.6 (*)    RDW 16.0 (*)    Lymphs Abs 4.4 (*)    All other components within normal limits  URINALYSIS, W/ REFLEX TO CULTURE (INFECTION SUSPECTED) - Abnormal; Notable for the following components:   Color, Urine ORANGE (*)    Glucose, UA   (*)    Value: TEST NOT REPORTED DUE TO COLOR INTERFERENCE OF URINE PIGMENT   Hgb urine dipstick   (*)    Value: TEST NOT REPORTED DUE TO COLOR INTERFERENCE OF URINE PIGMENT   Bilirubin Urine   (*)    Value: TEST NOT REPORTED DUE TO COLOR INTERFERENCE OF URINE PIGMENT   Ketones, ur   (*)    Value: TEST NOT REPORTED DUE TO COLOR INTERFERENCE OF URINE PIGMENT   Protein, ur   (*)    Value: TEST NOT REPORTED DUE TO COLOR INTERFERENCE OF URINE PIGMENT   Nitrite   (*)    Value: TEST NOT REPORTED DUE TO COLOR INTERFERENCE OF URINE PIGMENT   Leukocytes,Ua   (*)    Value: TEST NOT REPORTED DUE TO COLOR INTERFERENCE OF URINE PIGMENT   Bacteria, UA FEW (*)    All other components within normal limits  URINE CULTURE  LACTIC ACID, PLASMA  PREGNANCY, URINE    EKG: None  Radiology: CT ABDOMEN PELVIS W CONTRAST Result Date: 10/26/2024 EXAM: CT ABDOMEN AND PELVIS WITH CONTRAST 10/26/2024 06:23:34 PM TECHNIQUE: CT of the abdomen and pelvis was performed with the administration of 80 mL of iohexol  (OMNIPAQUE ) 300 MG/ML solution. Multiplanar reformatted images are provided for review. Automated exposure control, iterative reconstruction, and/or weight-based adjustment of the mA/kV was utilized to  reduce the radiation dose to as low as reasonably achievable. COMPARISON: 07/21/2024 CLINICAL HISTORY: UTI, recurrent/complicated (Female). FINDINGS: LOWER CHEST: No acute abnormality. LIVER: The liver is unremarkable. GALLBLADDER AND BILE DUCTS: Gallbladder is unremarkable. No biliary ductal dilatation. SPLEEN: No acute abnormality. PANCREAS: No acute abnormality. ADRENAL GLANDS: No acute abnormality. KIDNEYS, URETERS AND BLADDER: No stones in the kidneys or ureters. No hydronephrosis. No perinephric or periureteral stranding. Urinary bladder is unremarkable. GI AND BOWEL: Stomach demonstrates no acute abnormality. There is  no bowel obstruction. PERITONEUM AND RETROPERITONEUM: No ascites. No free air. VASCULATURE: Aorta is normal in caliber. LYMPH NODES: No lymphadenopathy. REPRODUCTIVE ORGANS: No acute abnormality. BONES AND SOFT TISSUES: No acute osseous abnormality. No focal soft tissue abnormality. IMPRESSION: 1. No acute findings in the abdomen or pelvis. Electronically signed by: Lynwood Seip MD 10/26/2024 06:34 PM EST RP Workstation: [id number]   DG Chest 2 View Result Date: 10/26/2024 CLINICAL DATA:  Infection EXAM: CHEST - 2 VIEW COMPARISON:  Chest x-ray 05/04/2024 FINDINGS: The heart size and mediastinal contours are within normal limits. Both lungs are clear. The visualized skeletal structures are unremarkable. IMPRESSION: No active cardiopulmonary disease. Electronically Signed   By: Greig Pique M.D.   On: 10/26/2024 18:04     Procedures   Medications Ordered in the ED  ketorolac  (TORADOL ) 15 MG/ML injection 15 mg (15 mg Intravenous Given 10/26/24 1746)  lactated ringers  bolus 1,000 mL (0 mLs Intravenous Stopped 10/26/24 1911)  ondansetron  (ZOFRAN ) injection 4 mg (4 mg Intravenous Given 10/26/24 1745)  morphine  (PF) 2 MG/ML injection 2 mg (2 mg Intravenous Given 10/26/24 1757)  iohexol  (OMNIPAQUE ) 300 MG/ML solution 80 mL (80 mLs Intravenous Contrast Given 10/26/24 1806)  cefTRIAXone   (ROCEPHIN ) 1 g in sodium chloride  0.9 % 100 mL IVPB (0 g Intravenous Stopped 10/26/24 1953)  ondansetron  (ZOFRAN -ODT) disintegrating tablet 4 mg (4 mg Oral Given 10/26/24 1912)  oxyCODONE -acetaminophen  (PERCOCET/ROXICET) 5-325 MG per tablet 1 tablet (1 tablet Oral Given 10/26/24 1912)    Clinical Course as of 10/26/24 2018  Wed Oct 26, 2024  1841 CT ABDOMEN PELVIS W CONTRAST IMPRESSION: 1. No acute findings in the abdomen or pelvis.   [TY]  1846 Urinalysis, w/ Reflex to Culture (Infection Suspected) -Urine, Clean Catch(!) Has been taking AZO.  Clinically with UTI. [TY]  1846 WBC(!): 12.1 [TY]  1846 Lactic Acid, Venous: 1.8 [TY]  1847 Patient is feeling improved, but is requesting additional nausea meds and pain medications.  Will give repeat dose of Zofran  and a Percocet for pain as a trial for symptom management as she likely with complicated UTI/pyelonephritis.  If tolerates p.o. can discharge with antibiotics. [TY]  [id number] Per chart review when she was admitted last year for pyelonephritis had Rocephin  without allergic reaction.  Will give a dose of rocephin  here.  [TY]  1946 Patient feeling improved.  Tolerating p.o.  Heart rate improved to 75 bpm.  Would like to go home.  Reasonable.  Will discharge with antibiotics.  Given strict return precautions. [TY]    Clinical Course User Index [TY] Neysa Caron PARAS, DO                                 Medical Decision Making 34 year old female presenting emergency department with dysuria and flank pain.  Tachycardic on arrival, but does appear quite uncomfortable and dry heaving.  She is afebrile hemodynamically stable.  Reports history of acute pyelonephritis.  Does appear that she was admitted back in August for pyelonephritis.  Symptoms consistent with same.  Broad workup with leukocytosis, but no elevated lactate.  No fever.  Comprehensive panel with no metabolic derangement secondary to nausea vomiting.  Normal creatinine.  Given no  transaminitis or elevated bilirubin nausea vomiting not secondary to hepatobiliary source pregnancy test negative, hyperemesis gravidarum unlikely.  UA of low utility given her use of AZO.  Clinically however with acute UTI.  Chest x-ray without free air on my dependent review.  I did get CT abdomen which did not show stranding consistent with acute pyelonephritis.  Received dose of Rocephin  here in the emergency department.  Tolerating p.o.  Discharged in stable condition.  Amount and/or Complexity of Data Reviewed Labs: ordered. Decision-making details documented in ED Course. Radiology: ordered and independent interpretation performed. Decision-making details documented in ED Course.  Risk Prescription drug management. Decision regarding hospitalization. Diagnosis or treatment significantly limited by social determinants of health.       Final diagnoses:  Pyelonephritis    ED Discharge Orders          Ordered    ciprofloxacin  (CIPRO ) 500 MG tablet  Every 12 hours        10/26/24 1950    ondansetron  (ZOFRAN -ODT) 4 MG disintegrating tablet  Every 8 hours PRN        10/26/24 1950    oxyCODONE -acetaminophen  (PERCOCET/ROXICET) 5-325 MG tablet  Every 6 hours PRN        10/26/24 1950    promethazine  (PHENERGAN ) 25 MG suppository  Every 6 hours PRN        10/26/24 1950               Neysa Caron PARAS, DO 10/26/24 2018

## 2024-10-27 ENCOUNTER — Other Ambulatory Visit (HOSPITAL_BASED_OUTPATIENT_CLINIC_OR_DEPARTMENT_OTHER): Payer: Self-pay

## 2024-10-27 ENCOUNTER — Other Ambulatory Visit: Payer: Self-pay

## 2024-10-27 LAB — URINE CULTURE
